# Patient Record
Sex: Female | Born: 1940 | Race: White | Marital: Married | Smoking: Former smoker
Health system: Southern US, Community
[De-identification: ages and names within clinical notes are randomized; demographics above are authoritative.]

## PROBLEM LIST (undated history)

## (undated) DIAGNOSIS — M1990 Unspecified osteoarthritis, unspecified site: Secondary | ICD-10-CM

## (undated) DIAGNOSIS — IMO0002 Reserved for concepts with insufficient information to code with codable children: Secondary | ICD-10-CM

## (undated) DIAGNOSIS — Z972 Presence of dental prosthetic device (complete) (partial): Secondary | ICD-10-CM

## (undated) DIAGNOSIS — C189 Malignant neoplasm of colon, unspecified: Secondary | ICD-10-CM

## (undated) DIAGNOSIS — K08109 Complete loss of teeth, unspecified cause, unspecified class: Secondary | ICD-10-CM

## (undated) DIAGNOSIS — Z973 Presence of spectacles and contact lenses: Secondary | ICD-10-CM

## (undated) DIAGNOSIS — C799 Secondary malignant neoplasm of unspecified site: Secondary | ICD-10-CM

## (undated) DIAGNOSIS — R531 Weakness: Secondary | ICD-10-CM

## (undated) DIAGNOSIS — I499 Cardiac arrhythmia, unspecified: Secondary | ICD-10-CM

## (undated) DIAGNOSIS — I482 Chronic atrial fibrillation, unspecified: Secondary | ICD-10-CM

## (undated) DIAGNOSIS — Z8719 Personal history of other diseases of the digestive system: Secondary | ICD-10-CM

## (undated) DIAGNOSIS — D649 Anemia, unspecified: Secondary | ICD-10-CM

## (undated) DIAGNOSIS — N181 Chronic kidney disease, stage 1: Secondary | ICD-10-CM

## (undated) DIAGNOSIS — R350 Frequency of micturition: Secondary | ICD-10-CM

## (undated) DIAGNOSIS — R630 Anorexia: Secondary | ICD-10-CM

## (undated) DIAGNOSIS — I10 Essential (primary) hypertension: Secondary | ICD-10-CM

## (undated) DIAGNOSIS — N1330 Unspecified hydronephrosis: Secondary | ICD-10-CM

## (undated) HISTORY — PX: TRANSTHORACIC ECHOCARDIOGRAM: SHX275

## (undated) HISTORY — PX: ESOPHAGOGASTRODUODENOSCOPY ENDOSCOPY: SHX5814

## (undated) HISTORY — PX: TONSILLECTOMY: SUR1361

## (undated) HISTORY — PX: HIATAL HERNIA REPAIR: SHX195

## (undated) HISTORY — PX: GASTRIC RESTRICTION SURGERY: SHX653

---

## 1971-01-13 HISTORY — PX: TUBAL LIGATION: SHX77

## 1978-09-13 HISTORY — PX: CHOLECYSTECTOMY: SHX55

## 1978-09-13 HISTORY — PX: INCISIONAL HERNIA REPAIR: SHX193

## 2013-04-03 ENCOUNTER — Other Ambulatory Visit: Payer: Self-pay | Admitting: Gastroenterology

## 2013-04-03 DIAGNOSIS — R109 Unspecified abdominal pain: Secondary | ICD-10-CM

## 2013-04-03 DIAGNOSIS — D649 Anemia, unspecified: Secondary | ICD-10-CM

## 2013-04-07 ENCOUNTER — Other Ambulatory Visit: Payer: Self-pay | Admitting: Gastroenterology

## 2013-04-07 ENCOUNTER — Ambulatory Visit
Admission: RE | Admit: 2013-04-07 | Discharge: 2013-04-07 | Disposition: A | Discharge status: Home / Self Care | Payer: Medicare Other | Source: Ambulatory Visit | Attending: Gastroenterology | Admitting: Gastroenterology

## 2013-04-07 DIAGNOSIS — R109 Unspecified abdominal pain: Secondary | ICD-10-CM

## 2013-04-07 DIAGNOSIS — D649 Anemia, unspecified: Secondary | ICD-10-CM

## 2013-04-07 MED ORDER — IOHEXOL 300 MG/ML  SOLN: 125.0000 mL | Freq: Once | INTRAMUSCULAR | Status: DC | PRN

## 2013-04-12 ENCOUNTER — Encounter (HOSPITAL_COMMUNITY): Payer: Self-pay | Admitting: *Deleted

## 2013-04-12 ENCOUNTER — Encounter (HOSPITAL_COMMUNITY): Payer: Self-pay | Admitting: Pharmacy Technician

## 2013-04-18 ENCOUNTER — Encounter (HOSPITAL_COMMUNITY): Payer: Self-pay | Admitting: *Deleted

## 2013-04-27 ENCOUNTER — Other Ambulatory Visit: Payer: Self-pay | Admitting: Gastroenterology

## 2013-04-27 NOTE — Addendum Note (Signed)
Addended byClarene Essex on: 04/27/2013 05:13 PM   Modules accepted: Orders

## 2013-05-02 ENCOUNTER — Ambulatory Visit (HOSPITAL_COMMUNITY): Payer: Medicare Other | Admitting: Anesthesiology

## 2013-05-02 ENCOUNTER — Encounter (HOSPITAL_COMMUNITY): Payer: Medicare Other | Admitting: Anesthesiology

## 2013-05-02 ENCOUNTER — Encounter (HOSPITAL_COMMUNITY)
Admission: RE | Disposition: A | Discharge status: Home / Self Care | Payer: Self-pay | Source: Ambulatory Visit | Attending: Gastroenterology

## 2013-05-02 ENCOUNTER — Encounter (HOSPITAL_COMMUNITY): Payer: Self-pay | Admitting: *Deleted

## 2013-05-02 ENCOUNTER — Ambulatory Visit (HOSPITAL_COMMUNITY)
Admission: RE | Admit: 2013-05-02 | Discharge: 2013-05-02 | Disposition: A | Discharge status: Home / Self Care | Payer: Medicare Other | Source: Ambulatory Visit | Attending: Gastroenterology | Admitting: Gastroenterology

## 2013-05-02 DIAGNOSIS — I129 Hypertensive chronic kidney disease with stage 1 through stage 4 chronic kidney disease, or unspecified chronic kidney disease: Secondary | ICD-10-CM | POA: Insufficient documentation

## 2013-05-02 DIAGNOSIS — Z79899 Other long term (current) drug therapy: Secondary | ICD-10-CM | POA: Insufficient documentation

## 2013-05-02 DIAGNOSIS — E785 Hyperlipidemia, unspecified: Secondary | ICD-10-CM | POA: Insufficient documentation

## 2013-05-02 DIAGNOSIS — N181 Chronic kidney disease, stage 1: Secondary | ICD-10-CM | POA: Insufficient documentation

## 2013-05-02 DIAGNOSIS — Z87891 Personal history of nicotine dependence: Secondary | ICD-10-CM | POA: Insufficient documentation

## 2013-05-02 DIAGNOSIS — D509 Iron deficiency anemia, unspecified: Secondary | ICD-10-CM | POA: Insufficient documentation

## 2013-05-02 DIAGNOSIS — I4891 Unspecified atrial fibrillation: Secondary | ICD-10-CM | POA: Insufficient documentation

## 2013-05-02 DIAGNOSIS — C182 Malignant neoplasm of ascending colon: Secondary | ICD-10-CM | POA: Insufficient documentation

## 2013-05-02 DIAGNOSIS — Z6841 Body Mass Index (BMI) 40.0 and over, adult: Secondary | ICD-10-CM | POA: Insufficient documentation

## 2013-05-02 DIAGNOSIS — M1990 Unspecified osteoarthritis, unspecified site: Secondary | ICD-10-CM | POA: Insufficient documentation

## 2013-05-02 DIAGNOSIS — E559 Vitamin D deficiency, unspecified: Secondary | ICD-10-CM | POA: Insufficient documentation

## 2013-05-02 HISTORY — DX: Anemia, unspecified: D64.9

## 2013-05-02 HISTORY — DX: Unspecified osteoarthritis, unspecified site: M19.90

## 2013-05-02 HISTORY — PX: COLONOSCOPY: SHX5424

## 2013-05-02 HISTORY — DX: Cardiac arrhythmia, unspecified: I49.9

## 2013-05-02 HISTORY — DX: Essential (primary) hypertension: I10

## 2013-05-02 LAB — POCT I-STAT 4, (NA,K, GLUC, HGB,HCT)
GLUCOSE: 89 mg/dL (ref 70–99)
HEMATOCRIT: 30 % — AB (ref 36.0–46.0)
Hemoglobin: 10.2 g/dL — ABNORMAL LOW (ref 12.0–15.0)
Potassium: 4 mEq/L (ref 3.7–5.3)
SODIUM: 140 meq/L (ref 137–147)

## 2013-05-02 LAB — CEA: CEA: 2.8 ng/mL (ref 0.0–5.0)

## 2013-05-02 SURGERY — COLONOSCOPY
Anesthesia: Monitor Anesthesia Care

## 2013-05-02 MED ORDER — SODIUM CHLORIDE 0.9 % IV SOLN: INTRAVENOUS | Status: DC

## 2013-05-02 MED ORDER — PROMETHAZINE HCL 25 MG/ML IJ SOLN: 6.2500 mg | INTRAMUSCULAR | Status: DC | PRN

## 2013-05-02 MED ORDER — PROPOFOL 10 MG/ML IV BOLUS
INTRAVENOUS | Status: DC | PRN
  Administered 2013-05-02 (×4): 50 mg via INTRAVENOUS

## 2013-05-02 MED ORDER — PROPOFOL 10 MG/ML IV BOLUS
INTRAVENOUS | Status: AC
  Filled 2013-05-02: qty 20

## 2013-05-02 MED ORDER — SPOT INK MARKER SYRINGE KIT
PACK | SUBMUCOSAL | Status: AC
  Filled 2013-05-02: qty 5

## 2013-05-02 MED ORDER — SPOT INK MARKER SYRINGE KIT
PACK | SUBMUCOSAL | Status: DC | PRN
  Administered 2013-05-02: 3 mL via SUBMUCOSAL

## 2013-05-02 MED ORDER — LACTATED RINGERS IV SOLN
INTRAVENOUS | Status: DC
  Administered 2013-05-02: 1000 mL via INTRAVENOUS

## 2013-05-02 NOTE — Transfer of Care (Signed)
Immediate Anesthesia Transfer of Care Note  Patient: Natalie Chapman  Procedure(s) Performed: Procedure(s) (LRB): COLONOSCOPY (N/A)  Patient Location: PACU  Anesthesia Type: MAC  Level of Consciousness: sedated, patient cooperative and responds to stimulation  Airway & Oxygen Therapy: Patient Spontanous Breathing and Patient connected to face mask oxgen  Post-op Assessment: Report given to PACU RN and Post -op Vital signs reviewed and stable  Post vital signs: Reviewed and stable  Complications: No apparent anesthesia complications

## 2013-05-02 NOTE — Progress Notes (Signed)
Natalie Chapman 12:48 PM  Subjective: Patient without any new complaints since we last saw her and she has not had her blood count rechecked  Objective: Vital signs stable afebrile exam please see pre-assessment evaluation  Assessment: Multiple medical problems including microcytic anemia and abnormal CAT scan  Plan: Okay to proceed with colonoscopy and anesthesia assistance and await recheck hemoglobin  Jeryl Columbia

## 2013-05-02 NOTE — Op Note (Signed)
[facility], [address]   COLONOSCOPY PROCEDURE REPORT  PATIENT: Natalie Chapman, Natalie Chapman  MR#: [id number] BIRTHDATE: May 29, 1940 , 84  yrs. old GENDER: Female ENDOSCOPIST: Clarene Essex, MD REFERRED UY:QIHKV Tripp, M.D. PROCEDURE DATE:  05/02/2013 PROCEDURE:   Colonoscopy with biopsy and Submucosal injection, any substance ASA CLASS:   Class III INDICATIONS:an abnormal CT and Iron Deficiency Anemia. MEDICATIONS: propofol (Diprivan) 200mg  IV  DESCRIPTION OF PROCEDURE:   After the risks benefits and alternatives of the procedure were thoroughly explained, informed consent was obtained.  The [id number] ([id number])  endoscope was introduced through the anus and advanced to the cecum, which was identified by both the appendix and ileocecal valve , limited by No adverse events experienced.  to advanced to the cecum did require abdominal pressureThe quality of the prep was adequate. .  The instrument was then slowly withdrawn as the colon was fully examined.the findings are recorded below and the patient tolerated the procedure well there was no obvious immediate complication       FINDINGS:  1. Partially obstructing ulcerative proximal ascending mass status post biopsy and distal injection with spot tattoo x3 approximately 1 cc each 2. Significant external greater than internal hemorrhoids 3 otherwise within normal limits to the cecum  COMPLICATIONS: none  IMPRESSION:  above  RECOMMENDATIONS: await pathology and CEA and surgical consultationand gI followup when necessary   _______________________________ eSigned:  Clarene Essex, MD 05/02/2013 1:50 PM   OV:FIEPP Fredderick Phenix, MD

## 2013-05-02 NOTE — Anesthesia Postprocedure Evaluation (Signed)
  Anesthesia Post-op Note  Patient: Natalie Chapman  Procedure(s) Performed: Procedure(s) (LRB): COLONOSCOPY (N/A)  Patient Location: PACU  Anesthesia Type: MAC  Level of Consciousness: awake and alert   Airway and Oxygen Therapy: Patient Spontanous Breathing  Post-op Pain: mild  Post-op Assessment: Post-op Vital signs reviewed, Patient's Cardiovascular Status Stable, Respiratory Function Stable, Patent Airway and No signs of Nausea or vomiting  Last Vitals:  Filed Vitals:   05/02/13 1343  BP: 101/61  Temp:   Resp: 8    Post-op Vital Signs: stable   Complications: No apparent anesthesia complications

## 2013-05-02 NOTE — Discharge Instructions (Signed)
Colonoscopy, Care After Refer to this sheet in the next few weeks. These instructions provide you with information on caring for yourself after your procedure. Your health care provider may also give you more specific instructions. Your treatment has been planned according to current medical practices, but problems sometimes occur. Call your health care provider if you have any problems or questions after your procedure. WHAT TO EXPECT AFTER THE PROCEDURE  After your procedure, it is typical to have the following:  A small amount of blood in your stool.  Moderate amounts of gas and mild abdominal cramping or bloating. HOME CARE INSTRUCTIONS  Do not drive, operate machinery, or sign important documents for 24 hours.  You may shower and resume your regular physical activities, but move at a slower pace for the first 24 hours.  Take frequent rest periods for the first 24 hours.  Walk around or put a warm pack on your abdomen to help reduce abdominal cramping and bloating.  Drink enough fluids to keep your urine clear or pale yellow.  You may resume your normal diet as instructed by your health care provider. Avoid heavy or fried foods that are hard to digest.  Avoid drinking alcohol for 24 hours or as instructed by your health care provider.  Only take over-the-counter or prescription medicines as directed by your health care provider.  If a tissue sample (biopsy) was taken during your procedure:  Do not take aspirin or blood thinners for 7 days, or as instructed by your health care provider.  Do not drink alcohol for 7 days, or as instructed by your health care provider.  Eat soft foods for the first 24 hours. SEEK MEDICAL CARE IF: You have persistent spotting of blood in your stool 2 3 days after the procedure. SEEK IMMEDIATE MEDICAL CARE IF:  You have more than a small spotting of blood in your stool.  You pass large blood clots in your stool.  Your abdomen is swollen  (distended).  You have nausea or vomiting.  You have a fever.  You have increasing abdominal pain that is not relieved with medicine. Document Released: 08/13/2003 Document Revised: 10/19/2012 Document Reviewed: 09/05/2012 [facility] Patient Information 2014 [place]. Call if question or problem otherwise call for biopsy report on Monday

## 2013-05-02 NOTE — Anesthesia Preprocedure Evaluation (Addendum)
Anesthesia Evaluation  Patient identified by MRN, date of birth, ID band Patient awake    Reviewed: Allergy & Precautions, H&P , NPO status , Patient's Chart, lab work & pertinent test results  Airway Mallampati: II TM Distance: >3 FB Neck ROM: Full    Dental no notable dental hx.    Pulmonary neg pulmonary ROS, former smoker,  breath sounds clear to auscultation  Pulmonary exam normal       Cardiovascular hypertension, Pt. on medications and Pt. on home beta blockers + dysrhythmias Atrial Fibrillation Rhythm:Irregular Rate:Normal     Neuro/Psych negative neurological ROS  negative psych ROS   GI/Hepatic negative GI ROS, Neg liver ROS,   Endo/Other  negative endocrine ROS  Renal/GU negative Renal ROS  negative genitourinary   Musculoskeletal negative musculoskeletal ROS (+)   Abdominal   Peds negative pediatric ROS (+)  Hematology negative hematology ROS (+)   Anesthesia Other Findings   Reproductive/Obstetrics negative OB ROS                         Anesthesia Physical Anesthesia Plan  ASA: III  Anesthesia Plan: MAC   Post-op Pain Management:    Induction: Intravenous  Airway Management Planned: Nasal Cannula  Additional Equipment:   Intra-op Plan:   Post-operative Plan:   Informed Consent: I have reviewed the patients History and Physical, chart, labs and discussed the procedure including the risks, benefits and alternatives for the proposed anesthesia with the patient or authorized representative who has indicated his/her understanding and acceptance.   Dental advisory given  Plan Discussed with: CRNA and Surgeon  Anesthesia Plan Comments:         Anesthesia Quick Evaluation

## 2013-05-03 ENCOUNTER — Encounter (HOSPITAL_COMMUNITY): Payer: Self-pay | Admitting: Gastroenterology

## 2013-05-12 ENCOUNTER — Encounter (INDEPENDENT_AMBULATORY_CARE_PROVIDER_SITE_OTHER): Payer: Self-pay | Admitting: General Surgery

## 2013-05-12 ENCOUNTER — Ambulatory Visit (INDEPENDENT_AMBULATORY_CARE_PROVIDER_SITE_OTHER): Payer: Medicare Other | Admitting: General Surgery

## 2013-05-12 VITALS — BP 126/74 | HR 80 | Temp 97.1°F | Resp 14 | Ht 63.0 in | Wt 270.0 lb

## 2013-05-12 DIAGNOSIS — N184 Chronic kidney disease, stage 4 (severe): Secondary | ICD-10-CM

## 2013-05-12 DIAGNOSIS — M1990 Unspecified osteoarthritis, unspecified site: Secondary | ICD-10-CM | POA: Insufficient documentation

## 2013-05-12 DIAGNOSIS — I4891 Unspecified atrial fibrillation: Secondary | ICD-10-CM | POA: Insufficient documentation

## 2013-05-12 DIAGNOSIS — D649 Anemia, unspecified: Secondary | ICD-10-CM | POA: Insufficient documentation

## 2013-05-12 DIAGNOSIS — C189 Malignant neoplasm of colon, unspecified: Secondary | ICD-10-CM | POA: Insufficient documentation

## 2013-05-12 NOTE — Patient Instructions (Signed)
[facility] [employer] [facility]  ONE-DAY (1) PRE-OP HOME COLON PREP INSTRUCTIONS: ** MIRALAX / GATORADE PREP **  Fill the two prescriptions at a pharmacy of your choice.  You must follow the instructions below carefully.  If you have questions or problems, please call and speak to someone in the clinic department at our office:   [phone].  MIRALAX - GATORADE -- DULCOLAX TABS:   Fill the prescriptions for MIRALAX  (255 gm bottle)    In addition, purchase four (4) DULCOLAX TABLETS (no prescription required), and one 64 oz GATORADE.  (Do NOT purchase red Gatorade; any other flavor is acceptable).  ANITIBIOTICS:   There will be 2 different antibiotics.     Take both prescriptions THE AFTERNOON BEFORE your surgery, at the times written on the bottles.  INSTRUCTIONS: 1. Five days prior to your procedure do not eat nuts, popcorn, or fruit with seeds.  Stop all fiber supplements such as Metamucil, Citrucel, etc.  2. The day before your procedure: o 6:00am:  take (4) Dulcolax tablets.  You should remain on clear liquids for the entire day.   CLEAR LIQUIDS: clear bouillon, broth, jello (NOT RED), black coffee, tea, soda, etc o 10:00am:  add the bottle of MiraLax to the 64-oz bottle of Gatorade, and dissolve.  Begin drinking the Gatorade mixture until gone (8 oz every 15-30 minutes).  Continue clear liquids until midnight (or bedtime). o Take the antibiotics at the times instructed on the bottles.  3. The day of your procedure:   Do not eat or drink ANYTHING after midnight before your surgery.     If you take Heart or Blood Pressure medicine, ask the pre-op nurses about these during your preop appointment.   Further pre-operative instructions will be given to you from the hospital.   Expect to be contacted 5-7 days before your surgery.       

## 2013-05-12 NOTE — Progress Notes (Signed)
Patient ID: Natalie Chapman, female   DOB: 04/25/1940, 73 y.o.   MRN: [id number]  Chief Complaint  Patient presents with  . eval for colon ca    HPI Natalie Chapman is a 73 y.o. female.   HPI  She is referred by Dr. Watt Chapman because of newly diagnosed ascending colon cancer.  She was noted to be anemic and also had blood in her stool. CT scan done in March of this year demonstrated a lesion in the ascending colon just proximal to the ileocecal valve. Colonoscopy was performed which demonstrated a circumferential lesion in that area. Biopsies were consistent with adenocarcinoma. CT scan did not show evidence of metastatic disease. She has been taking iron and her hemoglobin has improved.  She is here with family members.  Past Medical History  Diagnosis Date  . Hypertension   . Chronic kidney disease     Stage 1 kidney insufficiency  . Arthritis     arthritis left hip-ambulates with walker  . Anemia   . Cataracts, bilateral 04-18-13    both -immmature  . Dysrhythmia 04-18-13    irregular heart beat "Atrial Fib"-Dr. Tripp,PCP follows    Past Surgical History  Procedure Laterality Date  . Tubal ligation    . Stomach stapling      [facility]- [facility]  . Hernia repair      abdomen  . Tonsillectomy    . Colonoscopy N/A 05/02/2013    Procedure: COLONOSCOPY;  Surgeon: Natalie Columbia, MD;  Location: [facility];  Service: Endoscopy;  Laterality: N/A;   Open cholecystectomy  Family History  Problem Relation Age of Onset  . Heart disease Mother   . Heart disease Father     Social History History  Substance Use Topics  . Smoking status: Former Smoker    Types: Cigarettes    Quit date: 04/18/1985  . Smokeless tobacco: Not on file  . Alcohol Use: No    No Known Allergies  Current Outpatient Prescriptions  Medication Sig Dispense Refill  . acetaminophen (TYLENOL) 650 MG CR tablet Take 650 mg by mouth every 8 (eight) hours as needed for pain.      Marland Kitchen aspirin EC 81 MG tablet Take 81 mg by mouth  daily.      Marland Kitchen atorvastatin (LIPITOR) 10 MG tablet Take 5 mg by mouth at bedtime.      . enalapril (VASOTEC) 2.5 MG tablet Take 2.5 mg by mouth every morning.      . ferrous fumarate (HEMOCYTE - 106 MG FE) 325 (106 FE) MG TABS tablet Take 1 tablet by mouth 2 (two) times daily.      . metoprolol succinate (TOPROL-XL) 25 MG 24 hr tablet Take 25 mg by mouth every morning.      . pantoprazole (PROTONIX) 40 MG tablet Take 40 mg by mouth daily.       No current facility-administered medications for this visit.    Review of Systems Review of Systems  Constitutional: Negative.   Respiratory: Negative.   Cardiovascular: Positive for leg swelling.  Gastrointestinal: Positive for blood in stool.  Endocrine: Negative.   Genitourinary: Negative.   Musculoskeletal: Positive for arthralgias and gait problem.  Neurological: Negative.   Hematological: Negative.   Psychiatric/Behavioral: Negative.     Blood pressure 126/74, pulse 80, temperature 97.1 F (36.2 C), resp. rate 14, height 5\' 3"  (1.6 m), weight 270 lb (122.471 kg).  Physical Exam Physical Exam  Constitutional:  Morbidly obese female. She has very limited mobility.  HENT:  Head: Normocephalic and atraumatic.  Eyes: No scleral icterus.  Neck: Neck supple.  Cardiovascular:  Irregular rate, irregular rhythm.  Pulmonary/Chest: Effort normal and breath sounds normal.  Abdominal: Soft. She exhibits no mass. There is no tenderness.  Obese. Upper midline scar. Lower midline scar. Right subcostal scar. Large pannus.  Lymphadenopathy:    She has no cervical adenopathy.    Data Reviewed CT scan. Colonoscopy report. Pathology report. Note from Dr. Watt Chapman.  Assessment    Newly diagnosed cancer of the ascending colon. No radiographic evidence of malignant disease. She has a history of atrial flutter ablation and has never been seen by cardiologist for this. She also is morbidly obese and has very limited mobility.  Because of these 2  things, I feel her perioperative risks are higher.    Plan    We discussed laparoscopic possible open partial colectomy. Prior to this however she would need to be seen by cardiologist and had a preoperative cardiac evaluation.  I have explained the procedure and risks of colon resection.  Risks include but are not limited to bleeding, infection, wound problems, anesthesia, anastomotic leak, need for colostomy, injury to intraabominal organs (such as intestine, spleen, kidney, bladder, ureter, etc.), ileus, irregular bowel habits.  She may also have to go to a rehabilitation facility or skilled nursing facility postoperatively.   We will await for the cardiology evaluation before discussing scheduling of the surgery.      Natalie Chapman 05/12/2013, 4:19 PM

## 2013-05-29 ENCOUNTER — Encounter (INDEPENDENT_AMBULATORY_CARE_PROVIDER_SITE_OTHER): Payer: Self-pay

## 2013-05-31 ENCOUNTER — Encounter (INDEPENDENT_AMBULATORY_CARE_PROVIDER_SITE_OTHER): Payer: Self-pay

## 2013-06-19 ENCOUNTER — Encounter: Payer: Self-pay | Admitting: Cardiovascular Disease

## 2013-06-19 ENCOUNTER — Ambulatory Visit (INDEPENDENT_AMBULATORY_CARE_PROVIDER_SITE_OTHER): Payer: Medicare Other | Admitting: Cardiovascular Disease

## 2013-06-19 VITALS — BP 135/77 | HR 90 | Ht 60.0 in | Wt 266.8 lb

## 2013-06-19 DIAGNOSIS — I4891 Unspecified atrial fibrillation: Secondary | ICD-10-CM

## 2013-06-19 DIAGNOSIS — Z01810 Encounter for preprocedural cardiovascular examination: Secondary | ICD-10-CM

## 2013-06-19 NOTE — Assessment & Plan Note (Addendum)
Continue beta blocker for rate control  She may need iv cardizem post op  Surgery can consult our service in hospital and she will need telemetry bed post op.  She will be high risk for DVT  Suspect she will restart xarelto about 2 weeks after surgery  Echo to r/o valve disease Assess chamber sizes and make sure there is no pulmonary hypertesnion or decreased EF

## 2013-06-19 NOTE — Progress Notes (Signed)
Patient ID: Natalie Chapman, female   DOB: [birth year], 73 y.o.   MRN: [id number]   73 yo obese female referred by Dr Zella Richer for preop clearance She has no history of CHF, CAD but has afib.  Has not been seen by a cardiologist  Primary comes to house Started on xarelto and after about 5 weeks had GI bleed.  Seen by Dr Watt Climes and has colon CA  Needs right hemicolectomy with general anethesia.  No chest pain.  Chronic dyspnea from obesity.  Can do ADL's and barely use walker around house.  Chronic LE edema  No recent surgery Denies bleeding issues before starting xarelto.  Compliant with meds for HTN On beta blocker for rate control      ROS: Denies fever, malais, weight loss, blurry vision, decreased visual acuity, cough, sputum, SOB, hemoptysis, pleuritic pain, palpitaitons, heartburn, abdominal pain, melena, lower extremity edema, claudication, or rash.  All other systems reviewed and negative   General: Affect appropriate Pale obese white female   HEENT: normal Neck supple with no adenopathy JVP normal no bruits no thyromegaly Lungs clear with no wheezing and good diaphragmatic motion Heart:  S1/S2 no murmur,rub, gallop or click PMI normal Abdomen: benighn, BS positve, no tenderness, no AAA no bruit.  No HSM or HJR Distal pulses intact with no bruits Plus 2 bilteral  edema Neuro non-focal Skin warm and dry No muscular weakness  Medications Current Outpatient Prescriptions  Medication Sig Dispense Refill  . acetaminophen (TYLENOL) 650 MG CR tablet Take 650 mg by mouth every 8 (eight) hours as needed for pain.      Marland Kitchen aspirin EC 81 MG tablet Take 81 mg by mouth daily.      Marland Kitchen atorvastatin (LIPITOR) 10 MG tablet Take 5 mg by mouth at bedtime.      . Cholecalciferol (VITAMIN D3) 400 UNITS tablet Take 800 Units by mouth daily.      . enalapril (VASOTEC) 2.5 MG tablet Take 2.5 mg by mouth every morning.      . ferrous fumarate (HEMOCYTE - 106 MG FE) 325 (106 FE) MG TABS tablet Take 1  tablet by mouth 2 (two) times daily.      . metoprolol succinate (TOPROL-XL) 25 MG 24 hr tablet Take 25 mg by mouth every morning.      . pantoprazole (PROTONIX) 40 MG tablet Take 40 mg by mouth daily.       No current facility-administered medications for this visit.    Allergies Review of patient's allergies indicates no known allergies.  Family History: Family History  Problem Relation Age of Onset  . Heart disease Mother   . Heart disease Father     Social History: History   Social History  . Marital Status: Married    Spouse Name: N/A    Number of Children: N/A  . Years of Education: N/A   Occupational History  . Not on file.   Social History Main Topics  . Smoking status: Former Smoker    Types: Cigarettes    Quit date: 04/18/1985  . Smokeless tobacco: Not on file  . Alcohol Use: No  . Drug Use: No  . Sexual Activity: No   Other Topics Concern  . Not on file   Social History Narrative  . No narrative on file    Electrocardiogram:  Afib rate 90 nonspecific St T wave changes   Assessment and Plan

## 2013-06-19 NOTE — Assessment & Plan Note (Signed)
See notes below  Needs echo before clearing for surgery to assess RV and LV function.  No need for perfusion stress test as she does not have history of CAD, and no chest pain with surgery needed for cancer

## 2013-06-19 NOTE — Patient Instructions (Signed)

## 2013-06-20 ENCOUNTER — Ambulatory Visit (HOSPITAL_COMMUNITY): Payer: Medicare Other | Attending: Cardiovascular Disease | Admitting: Radiology

## 2013-06-20 DIAGNOSIS — I4891 Unspecified atrial fibrillation: Secondary | ICD-10-CM

## 2013-06-20 NOTE — Progress Notes (Signed)
Echocardiogram performed.  

## 2013-06-22 ENCOUNTER — Telehealth: Payer: Self-pay | Admitting: *Deleted

## 2013-06-22 NOTE — Telephone Encounter (Signed)
MAY  PT  PROCEED WITH  COLON  SURGERY ? CY

## 2013-06-23 ENCOUNTER — Other Ambulatory Visit (INDEPENDENT_AMBULATORY_CARE_PROVIDER_SITE_OTHER): Payer: Self-pay | Admitting: General Surgery

## 2013-06-23 ENCOUNTER — Telehealth (INDEPENDENT_AMBULATORY_CARE_PROVIDER_SITE_OTHER): Payer: Self-pay

## 2013-06-23 NOTE — Telephone Encounter (Signed)
Ok to proceed with colon surgery

## 2013-06-23 NOTE — Telephone Encounter (Signed)
Dr.Nishan's note fowarded to Francee Nodal RN

## 2013-06-23 NOTE — Telephone Encounter (Signed)
Dr Fredderick Phenix was at pt's home and wanted to see if Dr Zella Richer had scheduled sx. Informed Dr Fredderick Phenix that we were waiting on cardiac clearance for pt. Informed Dr Fredderick Phenix that Dr. Johnsie Cancel has cleared her today and that I would inform Dr Zella Richer and his assistant of this clearance. Dr Fredderick Phenix verbalized understanding and agrees with POC.

## 2013-06-23 NOTE — Telephone Encounter (Signed)
New Message  Dr. Shona Simpson called for Cardiac clearance for a Colon resection, Please call back to assist//SR

## 2013-06-26 ENCOUNTER — Encounter (INDEPENDENT_AMBULATORY_CARE_PROVIDER_SITE_OTHER): Payer: Medicare Other | Admitting: Surgery

## 2013-06-27 ENCOUNTER — Telehealth (INDEPENDENT_AMBULATORY_CARE_PROVIDER_SITE_OTHER): Payer: Self-pay

## 2013-06-27 NOTE — Telephone Encounter (Signed)
Confirmed with the patient that cardiac clearance rec'd from Dr. Johnsie Cancel.  Orders for surgery are in Epic.  Dr. Zella Richer out of the office until 07/03/13, but orders will be written when he returns.

## 2013-06-27 NOTE — Telephone Encounter (Signed)
Orders written for surgery and signed off on by Dr. Marlou Starks for Dr. Zella Richer.  Orders sent to surgery scheduling and pt will be notified some time this week about a surgery date.

## 2013-07-07 ENCOUNTER — Encounter (HOSPITAL_COMMUNITY): Payer: Self-pay | Admitting: Pharmacy Technician

## 2013-07-12 ENCOUNTER — Encounter (HOSPITAL_COMMUNITY): Payer: Self-pay

## 2013-07-12 ENCOUNTER — Other Ambulatory Visit (HOSPITAL_COMMUNITY): Payer: Self-pay | Admitting: *Deleted

## 2013-07-12 ENCOUNTER — Encounter (HOSPITAL_COMMUNITY)
Admission: RE | Admit: 2013-07-12 | Discharge: 2013-07-12 | Disposition: A | Discharge status: Home / Self Care | Payer: Medicare Other | Source: Ambulatory Visit | Attending: General Surgery | Admitting: General Surgery

## 2013-07-12 DIAGNOSIS — Z01818 Encounter for other preprocedural examination: Secondary | ICD-10-CM | POA: Insufficient documentation

## 2013-07-12 DIAGNOSIS — Z01812 Encounter for preprocedural laboratory examination: Secondary | ICD-10-CM | POA: Insufficient documentation

## 2013-07-12 HISTORY — DX: Personal history of other diseases of the digestive system: Z87.19

## 2013-07-12 LAB — PROTIME-INR
INR: 1.3 (ref 0.00–1.49)
Prothrombin Time: 16.2 seconds — ABNORMAL HIGH (ref 11.6–15.2)

## 2013-07-12 LAB — COMPREHENSIVE METABOLIC PANEL
ALK PHOS: 56 U/L (ref 39–117)
ALT: <5 U/L (ref 0–35)
AST: 9 U/L (ref 0–37)
Albumin: 3.7 g/dL (ref 3.5–5.2)
Anion gap: 14 (ref 5–15)
BUN: 26 mg/dL — ABNORMAL HIGH (ref 6–23)
CO2: 21 mEq/L (ref 19–32)
CREATININE: 1.5 mg/dL — AB (ref 0.50–1.10)
Calcium: 9.5 mg/dL (ref 8.4–10.5)
Chloride: 104 mEq/L (ref 96–112)
GFR calc non Af Amer: 33 mL/min — ABNORMAL LOW (ref >90)
GFR, EST AFRICAN AMERICAN: 39 mL/min — AB (ref >90)
GLUCOSE: 96 mg/dL (ref 70–99)
Potassium: 5.5 mEq/L — ABNORMAL HIGH (ref 3.7–5.3)
Sodium: 139 mEq/L (ref 137–147)
TOTAL PROTEIN: 8.1 g/dL (ref 6.0–8.3)
Total Bilirubin: 0.7 mg/dL (ref 0.3–1.2)

## 2013-07-12 LAB — CBC WITH DIFFERENTIAL/PLATELET
BASOS PCT: 1 % (ref 0–1)
Basophils Absolute: 0 10*3/uL (ref 0.0–0.1)
EOS ABS: 0.3 10*3/uL (ref 0.0–0.7)
EOS PCT: 5 % (ref 0–5)
HCT: 27 % — ABNORMAL LOW (ref 36.0–46.0)
Hemoglobin: 8.5 g/dL — ABNORMAL LOW (ref 12.0–15.0)
LYMPHS ABS: 1.1 10*3/uL (ref 0.7–4.0)
Lymphocytes Relative: 18 % (ref 12–46)
MCH: 28.6 pg (ref 26.0–34.0)
MCHC: 31.5 g/dL (ref 30.0–36.0)
MCV: 90.9 fL (ref 78.0–100.0)
MONOS PCT: 9 % (ref 3–12)
Monocytes Absolute: 0.5 10*3/uL (ref 0.1–1.0)
Neutro Abs: 4 10*3/uL (ref 1.7–7.7)
Neutrophils Relative %: 67 % (ref 43–77)
Platelets: 221 10*3/uL (ref 150–400)
RBC: 2.97 MIL/uL — AB (ref 3.87–5.11)
RDW: 16.1 % — ABNORMAL HIGH (ref 11.5–15.5)
WBC: 5.9 10*3/uL (ref 4.0–10.5)

## 2013-07-12 LAB — ABO/RH: ABO/RH(D): A POS

## 2013-07-12 NOTE — Pre-Procedure Instructions (Signed)
Natalie Chapman  07/12/2013   Your procedure is scheduled on:  Thursday, July 20, 2013 at 7:30 AM.   Report to [facility] Entrance "A" Admitting Office at 5:30 AM.   Call this number if you have problems the morning of surgery: [phone]   Remember:   Do not eat food or drink liquids after midnight Wednesday, 07/19/13.   Take these medicines the morning of surgery with A SIP OF WATER: metoprolol succinate (TOPROL-XL), acetaminophen (TYLENOL) - if needed.  Stop Aspirin as of today.     Do not wear jewelry, make-up or nail polish.  Do not wear lotions, powders, or perfumes. You may wear deodorant.  Do not shave 48 hours prior to surgery.   Do not bring valuables to the hospital.  [facility] is not responsible                  for any belongings or valuables.               Contacts, dentures or bridgework may not be worn into surgery.  Leave suitcase in the car. After surgery it may be brought to your room.  For patients admitted to the hospital, discharge time is determined by your                treatment team.                Special Instructions: [facility] - Preparing for Surgery  Before surgery, you can play an important role.  Because skin is not sterile, your skin needs to be as free of germs as possible.  You can reduce the number of germs on you skin by washing with CHG (chlorahexidine gluconate) soap before surgery.  CHG is an antiseptic cleaner which kills germs and bonds with the skin to continue killing germs even after washing.  Please DO NOT use if you have an allergy to CHG or antibacterial soaps.  If your skin becomes reddened/irritated stop using the CHG and inform your nurse when you arrive at Short Stay.  Do not shave (including legs and underarms) for at least 48 hours prior to the first CHG shower.  You may shave your face.  Please follow these instructions carefully:   1.  Shower with CHG Soap the night before surgery and the                                 morning of Surgery.  2.  If you choose to wash your hair, wash your hair first as usual with your       normal shampoo.  3.  After you shampoo, rinse your hair and body thoroughly to remove the                      Shampoo.  4.  Use CHG as you would any other liquid soap.  You can apply chg directly       to the skin and wash gently with scrungie or a clean washcloth.  5.  Apply the CHG Soap to your body ONLY FROM THE NECK DOWN.        Do not use on open wounds or open sores.  Avoid contact with your eyes, ears, mouth and genitals (private parts).  Wash genitals (private parts) with your normal soap.  6.  Wash thoroughly, paying special attention to the area where your surgery  will be performed.  7.  Thoroughly rinse your body with warm water from the neck down.  8.  DO NOT shower/wash with your normal soap after using and rinsing off       the CHG Soap.  9.  Pat yourself dry with a clean towel.            10.  Wear clean pajamas.            11.  Place clean sheets on your bed the night of your first shower and do not        sleep with pets.  Day of Surgery  Do not apply any lotions the morning of surgery.  Please wear clean clothes to the hospital/surgery center.     Please read over the following fact sheets that you were given: Pain Booklet, Coughing and Deep Breathing, Blood Transfusion Information and Surgical Site Infection Prevention

## 2013-07-12 NOTE — Progress Notes (Signed)
07/12/13 1439  OBSTRUCTIVE SLEEP APNEA  Have you ever been diagnosed with sleep apnea through a sleep study? No  Do you snore loudly (loud enough to be heard through closed doors)?  1  Do you often feel tired, fatigued, or sleepy during the daytime? 1  Has anyone observed you stop breathing during your sleep? 1  Do you have, or are you being treated for high blood pressure? 1  BMI more than 35 kg/m2? 1  Age over 74 years old? 1  Neck circumference greater than 40 cm/16 inches? 0 (14)  Gender: 0  Obstructive Sleep Apnea Score 6  Score 4 or greater  Results sent to PCP

## 2013-07-13 NOTE — Progress Notes (Signed)
Anesthesia Chart Review:  Patient is a 73 year old female scheduled for laparoscopic assisted partial colectomy on 07/20/13 by Dr. Zella Richer.  History includes colon cancer, former smoker, afib, CKD stage 1, anemia, hiatal hernia, arthritis, cataracts, SOB, venous insufficiency with venous stasis ulcer history, cholecystectomy, abdominal hernia repair, gastric stapling, tonsillectomy.  BMI is consistent with morbid obesity.  OSA screening score is 6. PCP is Dr. Reymundo Poll. Cardiologist is Dr. Johnsie Cancel who cleared her for surgery. His note states she may need Cardizem post-operatively and will need post-operative telemetry. He felt she would be high risk for DVT, and thought her Xarelto would be started about 2 weeks post-operatively.   EKG on 06/19/13 showed: Afib, low voltage QRS, T wave abnormality, consider inferior and anterior ischemia.   Echo on 06/20/13 showed:  - Left ventricle: The cavity size was normal. Wall thickness was normal. Systolic function was normal. The estimated ejection fraction was in the range of 55% to 60%. - Aortic valve: Possible lamble&'s excresense. - Mitral valve: There was mild regurgitation. - Left atrium: The atrium was severely dilated. - Atrial septum: No defect or patent foramen ovale was identified. - Pulmonic valve: Mild regurgitation. - Tricuspid valve: There was moderate regurgitation.  CXR on 07/12/13 showed: Prominent coarse interstitial markings most likely chronic in nature. A superimposed active inflammatory process cannot be excluded. No edema is seen.  Preoperative labs noted. K 5.5 (no mention of hemolysis), BUN/Cr 26/1.50. Previous BUN/Cr on 05/04/13 were 13/1.12.  H/H 8.5/27.0, up from her last few H/H (last 8.0/24.2 on 06/23/13). She is on an iron supplement. PT/INR 16.2/1.30. T&S done. Labs reviewed with anesthesiologist Dr. Tobias Alexander who did not feel labs would need to be repeated preoperatively. Will defer decision to transfuse to the surgeon and/or  assigned anesthesiologist.    Myra Gianotti, PA-C [facility] Short Stay [facility]/Anesthesiology Phone [phone] 07/13/2013 5:29 PM

## 2013-07-19 MED ORDER — DEXTROSE 5 % IV SOLN
1.0000 g | Freq: Two times a day (BID) | INTRAVENOUS | Status: DC
  Administered 2013-07-20: 1 g via INTRAVENOUS
  Filled 2013-07-19 (×3): qty 1

## 2013-07-19 MED ORDER — ALVIMOPAN 12 MG PO CAPS
12.0000 mg | ORAL_CAPSULE | Freq: Once | ORAL | Status: AC
  Administered 2013-07-20: 12 mg via ORAL
  Filled 2013-07-19 (×2): qty 1

## 2013-07-20 ENCOUNTER — Encounter (HOSPITAL_COMMUNITY)
Admission: RE | Disposition: A | Discharge status: Home Health | Payer: Self-pay | Source: Ambulatory Visit | Attending: General Surgery

## 2013-07-20 ENCOUNTER — Encounter (HOSPITAL_COMMUNITY): Payer: Self-pay | Admitting: Anesthesiology

## 2013-07-20 ENCOUNTER — Encounter (HOSPITAL_COMMUNITY): Payer: Medicare Other | Admitting: Vascular Surgery

## 2013-07-20 ENCOUNTER — Inpatient Hospital Stay (HOSPITAL_COMMUNITY): Payer: Medicare Other | Admitting: Anesthesiology

## 2013-07-20 ENCOUNTER — Inpatient Hospital Stay (HOSPITAL_COMMUNITY)
Admission: RE | Admit: 2013-07-20 | Discharge: 2013-07-24 | DRG: 330 | Disposition: A | Discharge status: Home Health | Payer: Medicare Other | Source: Ambulatory Visit | Attending: General Surgery | Admitting: General Surgery

## 2013-07-20 DIAGNOSIS — I129 Hypertensive chronic kidney disease with stage 1 through stage 4 chronic kidney disease, or unspecified chronic kidney disease: Secondary | ICD-10-CM | POA: Diagnosis present

## 2013-07-20 DIAGNOSIS — Z79899 Other long term (current) drug therapy: Secondary | ICD-10-CM

## 2013-07-20 DIAGNOSIS — I4891 Unspecified atrial fibrillation: Secondary | ICD-10-CM | POA: Diagnosis present

## 2013-07-20 DIAGNOSIS — J9819 Other pulmonary collapse: Secondary | ICD-10-CM | POA: Diagnosis not present

## 2013-07-20 DIAGNOSIS — D649 Anemia, unspecified: Secondary | ICD-10-CM | POA: Diagnosis present

## 2013-07-20 DIAGNOSIS — Z7982 Long term (current) use of aspirin: Secondary | ICD-10-CM

## 2013-07-20 DIAGNOSIS — Z87891 Personal history of nicotine dependence: Secondary | ICD-10-CM

## 2013-07-20 DIAGNOSIS — M129 Arthropathy, unspecified: Secondary | ICD-10-CM | POA: Diagnosis present

## 2013-07-20 DIAGNOSIS — C189 Malignant neoplasm of colon, unspecified: Secondary | ICD-10-CM | POA: Diagnosis present

## 2013-07-20 DIAGNOSIS — N181 Chronic kidney disease, stage 1: Secondary | ICD-10-CM | POA: Diagnosis present

## 2013-07-20 DIAGNOSIS — C182 Malignant neoplasm of ascending colon: Principal | ICD-10-CM | POA: Diagnosis present

## 2013-07-20 DIAGNOSIS — Z6841 Body Mass Index (BMI) 40.0 and over, adult: Secondary | ICD-10-CM

## 2013-07-20 DIAGNOSIS — N184 Chronic kidney disease, stage 4 (severe): Secondary | ICD-10-CM | POA: Diagnosis present

## 2013-07-20 HISTORY — DX: Chronic kidney disease, stage 1: N18.1

## 2013-07-20 HISTORY — PX: LAPAROSCOPIC PARTIAL COLECTOMY: SHX5907

## 2013-07-20 HISTORY — DX: Malignant neoplasm of colon, unspecified: C18.9

## 2013-07-20 LAB — CBC
HEMATOCRIT: 26.6 % — AB (ref 36.0–46.0)
Hemoglobin: 8.3 g/dL — ABNORMAL LOW (ref 12.0–15.0)
MCH: 28.7 pg (ref 26.0–34.0)
MCHC: 31.2 g/dL (ref 30.0–36.0)
MCV: 92 fL (ref 78.0–100.0)
PLATELETS: 209 10*3/uL (ref 150–400)
RBC: 2.89 MIL/uL — ABNORMAL LOW (ref 3.87–5.11)
RDW: 15 % (ref 11.5–15.5)
WBC: 6.9 10*3/uL (ref 4.0–10.5)

## 2013-07-20 LAB — CREATININE, SERUM
CREATININE: 1.31 mg/dL — AB (ref 0.50–1.10)
GFR calc Af Amer: 46 mL/min — ABNORMAL LOW (ref >90)
GFR, EST NON AFRICAN AMERICAN: 39 mL/min — AB (ref >90)

## 2013-07-20 LAB — PREPARE RBC (CROSSMATCH)

## 2013-07-20 SURGERY — LAPAROSCOPIC PARTIAL COLECTOMY
Anesthesia: General | Site: Abdomen

## 2013-07-20 MED ORDER — ONDANSETRON HCL 4 MG/2ML IJ SOLN
INTRAMUSCULAR | Status: AC
  Filled 2013-07-20: qty 2

## 2013-07-20 MED ORDER — DEXTROSE 5 % IV SOLN
2.0000 g | Freq: Two times a day (BID) | INTRAVENOUS | Status: AC
  Administered 2013-07-20: 2 g via INTRAVENOUS
  Filled 2013-07-20: qty 2

## 2013-07-20 MED ORDER — DEXTROSE-NACL 5-0.9 % IV SOLN
INTRAVENOUS | Status: DC
  Administered 2013-07-20: 1000 mL via INTRAVENOUS
  Administered 2013-07-21 (×2): via INTRAVENOUS

## 2013-07-20 MED ORDER — ONDANSETRON HCL 4 MG/2ML IJ SOLN
4.0000 mg | Freq: Four times a day (QID) | INTRAMUSCULAR | Status: DC | PRN
  Filled 2013-07-20: qty 2

## 2013-07-20 MED ORDER — DEXAMETHASONE SODIUM PHOSPHATE 4 MG/ML IJ SOLN
INTRAMUSCULAR | Status: DC | PRN
  Administered 2013-07-20: 8 mg via INTRAVENOUS

## 2013-07-20 MED ORDER — ARTIFICIAL TEARS OP OINT
TOPICAL_OINTMENT | OPHTHALMIC | Status: DC | PRN
  Administered 2013-07-20: 1 via OPHTHALMIC

## 2013-07-20 MED ORDER — ENOXAPARIN SODIUM 40 MG/0.4ML ~~LOC~~ SOLN
40.0000 mg | SUBCUTANEOUS | Status: DC
  Administered 2013-07-21 – 2013-07-24 (×4): 40 mg via SUBCUTANEOUS
  Filled 2013-07-20 (×5): qty 0.4

## 2013-07-20 MED ORDER — EPHEDRINE SULFATE 50 MG/ML IJ SOLN
INTRAMUSCULAR | Status: AC
  Filled 2013-07-20: qty 1

## 2013-07-20 MED ORDER — PROPOFOL 10 MG/ML IV BOLUS
INTRAVENOUS | Status: AC
  Filled 2013-07-20: qty 20

## 2013-07-20 MED ORDER — ROCURONIUM BROMIDE 100 MG/10ML IV SOLN
INTRAVENOUS | Status: DC | PRN
  Administered 2013-07-20: 40 mg via INTRAVENOUS
  Administered 2013-07-20 (×3): 10 mg via INTRAVENOUS

## 2013-07-20 MED ORDER — LIDOCAINE HCL (CARDIAC) 20 MG/ML IV SOLN
INTRAVENOUS | Status: DC | PRN
  Administered 2013-07-20: 100 mg via INTRAVENOUS

## 2013-07-20 MED ORDER — DIPHENHYDRAMINE HCL 50 MG/ML IJ SOLN
12.5000 mg | Freq: Four times a day (QID) | INTRAMUSCULAR | Status: DC | PRN
Start: 2013-07-20 — End: 2013-07-22
  Filled 2013-07-20: qty 0.25

## 2013-07-20 MED ORDER — ONDANSETRON HCL 4 MG PO TABS: 4.0000 mg | ORAL_TABLET | Freq: Four times a day (QID) | ORAL | Status: DC | PRN

## 2013-07-20 MED ORDER — NEOSTIGMINE METHYLSULFATE 10 MG/10ML IV SOLN
INTRAVENOUS | Status: DC | PRN
  Administered 2013-07-20 (×2): 1 mg via INTRAVENOUS
  Administered 2013-07-20: 3 mg via INTRAVENOUS

## 2013-07-20 MED ORDER — ALVIMOPAN 12 MG PO CAPS
12.0000 mg | ORAL_CAPSULE | Freq: Two times a day (BID) | ORAL | Status: DC
  Administered 2013-07-21 – 2013-07-23 (×5): 12 mg via ORAL
  Filled 2013-07-20 (×6): qty 1

## 2013-07-20 MED ORDER — ONDANSETRON HCL 4 MG/2ML IJ SOLN: 4.0000 mg | Freq: Once | INTRAMUSCULAR | Status: DC | PRN

## 2013-07-20 MED ORDER — STERILE WATER FOR INJECTION IJ SOLN
INTRAMUSCULAR | Status: AC
  Filled 2013-07-20: qty 10

## 2013-07-20 MED ORDER — LACTATED RINGERS IV SOLN: INTRAVENOUS | Status: DC | PRN

## 2013-07-20 MED ORDER — GLYCOPYRROLATE 0.2 MG/ML IJ SOLN
INTRAMUSCULAR | Status: DC | PRN
  Administered 2013-07-20: 0.4 mg via INTRAVENOUS

## 2013-07-20 MED ORDER — FENTANYL CITRATE 0.05 MG/ML IJ SOLN
INTRAMUSCULAR | Status: AC
  Filled 2013-07-20: qty 5

## 2013-07-20 MED ORDER — DIPHENHYDRAMINE HCL 12.5 MG/5ML PO ELIX
12.5000 mg | ORAL_SOLUTION | Freq: Four times a day (QID) | ORAL | Status: DC | PRN
  Filled 2013-07-20: qty 5

## 2013-07-20 MED ORDER — BUPIVACAINE HCL 0.25 % IJ SOLN
INTRAMUSCULAR | Status: DC | PRN
  Administered 2013-07-20: 10 mL

## 2013-07-20 MED ORDER — BUPIVACAINE-EPINEPHRINE (PF) 0.25% -1:200000 IJ SOLN
INTRAMUSCULAR | Status: AC
  Filled 2013-07-20: qty 30

## 2013-07-20 MED ORDER — SUCCINYLCHOLINE CHLORIDE 20 MG/ML IJ SOLN
INTRAMUSCULAR | Status: AC
  Filled 2013-07-20: qty 1

## 2013-07-20 MED ORDER — ONDANSETRON HCL 4 MG/2ML IJ SOLN
INTRAMUSCULAR | Status: DC | PRN
  Administered 2013-07-20: 4 mg via INTRAVENOUS

## 2013-07-20 MED ORDER — HYDROMORPHONE HCL PF 1 MG/ML IJ SOLN
INTRAMUSCULAR | Status: AC
  Filled 2013-07-20: qty 1

## 2013-07-20 MED ORDER — FENTANYL CITRATE 0.05 MG/ML IJ SOLN
INTRAMUSCULAR | Status: DC | PRN
  Administered 2013-07-20: 100 ug via INTRAVENOUS
  Administered 2013-07-20: 50 ug via INTRAVENOUS

## 2013-07-20 MED ORDER — NEOSTIGMINE METHYLSULFATE 10 MG/10ML IV SOLN
INTRAVENOUS | Status: AC
  Filled 2013-07-20: qty 1

## 2013-07-20 MED ORDER — ARTIFICIAL TEARS OP OINT
TOPICAL_OINTMENT | OPHTHALMIC | Status: AC
  Filled 2013-07-20: qty 3.5

## 2013-07-20 MED ORDER — PHENYLEPHRINE HCL 10 MG/ML IJ SOLN
INTRAMUSCULAR | Status: DC | PRN
  Administered 2013-07-20 (×4): 80 ug via INTRAVENOUS

## 2013-07-20 MED ORDER — DEXAMETHASONE SODIUM PHOSPHATE 4 MG/ML IJ SOLN
INTRAMUSCULAR | Status: AC
  Filled 2013-07-20: qty 2

## 2013-07-20 MED ORDER — MORPHINE SULFATE (PF) 1 MG/ML IV SOLN
INTRAVENOUS | Status: AC
  Filled 2013-07-20: qty 25

## 2013-07-20 MED ORDER — LIDOCAINE HCL (CARDIAC) 20 MG/ML IV SOLN
INTRAVENOUS | Status: AC
  Filled 2013-07-20: qty 5

## 2013-07-20 MED ORDER — MIDAZOLAM HCL 2 MG/2ML IJ SOLN
INTRAMUSCULAR | Status: AC
  Filled 2013-07-20: qty 2

## 2013-07-20 MED ORDER — SODIUM CHLORIDE 0.9 % IV SOLN
10.0000 mg | INTRAVENOUS | Status: DC | PRN
  Administered 2013-07-20: 10 ug/min via INTRAVENOUS

## 2013-07-20 MED ORDER — ONDANSETRON HCL 4 MG/2ML IJ SOLN: 4.0000 mg | INTRAMUSCULAR | Status: DC | PRN

## 2013-07-20 MED ORDER — MORPHINE SULFATE (PF) 1 MG/ML IV SOLN
INTRAVENOUS | Status: DC
  Administered 2013-07-20: 11:00:00 via INTRAVENOUS
  Administered 2013-07-20: 2 mg via INTRAVENOUS
  Administered 2013-07-20 – 2013-07-22 (×5): 1 mg via INTRAVENOUS

## 2013-07-20 MED ORDER — SODIUM CHLORIDE 0.9 % IV SOLN
INTRAVENOUS | Status: DC | PRN
  Administered 2013-07-20 (×2): via INTRAVENOUS

## 2013-07-20 MED ORDER — PHENYLEPHRINE 40 MCG/ML (10ML) SYRINGE FOR IV PUSH (FOR BLOOD PRESSURE SUPPORT)
PREFILLED_SYRINGE | INTRAVENOUS | Status: AC
  Filled 2013-07-20: qty 10

## 2013-07-20 MED ORDER — HYDROMORPHONE HCL PF 1 MG/ML IJ SOLN
0.2500 mg | INTRAMUSCULAR | Status: DC | PRN
  Administered 2013-07-20: 0.25 mg via INTRAVENOUS

## 2013-07-20 MED ORDER — PROPOFOL 10 MG/ML IV BOLUS
INTRAVENOUS | Status: DC | PRN
  Administered 2013-07-20: 140 mg via INTRAVENOUS
  Administered 2013-07-20: 30 mg via INTRAVENOUS

## 2013-07-20 MED ORDER — ROCURONIUM BROMIDE 50 MG/5ML IV SOLN
INTRAVENOUS | Status: AC
  Filled 2013-07-20: qty 2

## 2013-07-20 MED ORDER — SODIUM CHLORIDE 0.9 % IJ SOLN: 9.0000 mL | INTRAMUSCULAR | Status: DC | PRN

## 2013-07-20 MED ORDER — PANTOPRAZOLE SODIUM 40 MG IV SOLR
40.0000 mg | INTRAVENOUS | Status: DC
  Administered 2013-07-20 – 2013-07-21 (×2): 40 mg via INTRAVENOUS
  Filled 2013-07-20 (×3): qty 40

## 2013-07-20 MED ORDER — NALOXONE HCL 0.4 MG/ML IJ SOLN
0.4000 mg | INTRAMUSCULAR | Status: DC | PRN
  Filled 2013-07-20: qty 1

## 2013-07-20 MED ORDER — GLYCOPYRROLATE 0.2 MG/ML IJ SOLN
INTRAMUSCULAR | Status: AC
  Filled 2013-07-20: qty 2

## 2013-07-20 MED ORDER — 0.9 % SODIUM CHLORIDE (POUR BTL) OPTIME
TOPICAL | Status: DC | PRN
  Administered 2013-07-20 (×3): 1000 mL

## 2013-07-20 MED ORDER — METOPROLOL SUCCINATE ER 25 MG PO TB24
25.0000 mg | ORAL_TABLET | Freq: Every day | ORAL | Status: DC
  Administered 2013-07-21 – 2013-07-24 (×4): 25 mg via ORAL
  Filled 2013-07-20 (×4): qty 1

## 2013-07-20 MED ORDER — SODIUM CHLORIDE 0.9 % IV BOLUS (SEPSIS)
500.0000 mL | Freq: Once | INTRAVENOUS | Status: AC
  Administered 2013-07-20: 500 mL via INTRAVENOUS

## 2013-07-20 SURGICAL SUPPLY — 92 items
APPLIER CLIP ROT 10 11.4 M/L (STAPLE)
BLADE SURG 10 STRL SS (BLADE) ×3 IMPLANT
BLADE SURG ROTATE 9660 (MISCELLANEOUS) IMPLANT
CANISTER SUCTION 2500CC (MISCELLANEOUS) ×3 IMPLANT
CELLS DAT CNTRL 66122 CELL SVR (MISCELLANEOUS) IMPLANT
CHLORAPREP W/TINT 26ML (MISCELLANEOUS) ×3 IMPLANT
CLIP APPLIE ROT 10 11.4 M/L (STAPLE) IMPLANT
COVER MAYO STAND STRL (DRAPES) ×3 IMPLANT
COVER SURGICAL LIGHT HANDLE (MISCELLANEOUS) ×6 IMPLANT
DRAPE PROXIMA HALF (DRAPES) ×3 IMPLANT
DRAPE SURG 17X23 STRL (DRAPES) ×3 IMPLANT
DRAPE UTILITY 15X26 W/TAPE STR (DRAPE) ×18 IMPLANT
DRAPE WARM FLUID 44X44 (DRAPE) ×3 IMPLANT
DRESSING ALLEVYN LIFE SACRUM (GAUZE/BANDAGES/DRESSINGS) ×3 IMPLANT
DRSG OPSITE POSTOP 4X10 (GAUZE/BANDAGES/DRESSINGS) IMPLANT
DRSG OPSITE POSTOP 4X6 (GAUZE/BANDAGES/DRESSINGS) ×3 IMPLANT
DRSG OPSITE POSTOP 4X8 (GAUZE/BANDAGES/DRESSINGS) IMPLANT
ELECT BLADE 6.5 EXT (BLADE) ×3 IMPLANT
ELECT CAUTERY BLADE 6.4 (BLADE) ×6 IMPLANT
ELECT REM PT RETURN 9FT ADLT (ELECTROSURGICAL) ×3
ELECTRODE REM PT RTRN 9FT ADLT (ELECTROSURGICAL) ×1 IMPLANT
GAUZE SPONGE 2X2 8PLY STRL LF (GAUZE/BANDAGES/DRESSINGS) ×1 IMPLANT
GEL ULTRASOUND 20GR AQUASONIC (MISCELLANEOUS) IMPLANT
GLOVE BIO SURGEON STRL SZ7 (GLOVE) ×6 IMPLANT
GLOVE BIO SURGEON STRL SZ7.5 (GLOVE) ×9 IMPLANT
GLOVE BIO SURGEON STRL SZ8 (GLOVE) ×6 IMPLANT
GLOVE BIOGEL PI IND STRL 6 (GLOVE) ×2 IMPLANT
GLOVE BIOGEL PI IND STRL 6.5 (GLOVE) ×2 IMPLANT
GLOVE BIOGEL PI IND STRL 7.0 (GLOVE) ×1 IMPLANT
GLOVE BIOGEL PI IND STRL 7.5 (GLOVE) ×7 IMPLANT
GLOVE BIOGEL PI IND STRL 8 (GLOVE) ×4 IMPLANT
GLOVE BIOGEL PI INDICATOR 6 (GLOVE) ×4
GLOVE BIOGEL PI INDICATOR 6.5 (GLOVE) ×4
GLOVE BIOGEL PI INDICATOR 7.0 (GLOVE) ×2
GLOVE BIOGEL PI INDICATOR 7.5 (GLOVE) ×14
GLOVE BIOGEL PI INDICATOR 8 (GLOVE) ×8
GLOVE ECLIPSE 8.0 STRL XLNG CF (GLOVE) ×6 IMPLANT
GLOVE SS BIOGEL STRL SZ 6.5 (GLOVE) ×1 IMPLANT
GLOVE SUPERSENSE BIOGEL SZ 6.5 (GLOVE) ×2
GOWN STRL REUS W/ TWL LRG LVL3 (GOWN DISPOSABLE) ×9 IMPLANT
GOWN STRL REUS W/ TWL XL LVL3 (GOWN DISPOSABLE) ×2 IMPLANT
GOWN STRL REUS W/TWL LRG LVL3 (GOWN DISPOSABLE) ×18
GOWN STRL REUS W/TWL XL LVL3 (GOWN DISPOSABLE) ×4
KIT BASIN OR (CUSTOM PROCEDURE TRAY) ×3 IMPLANT
KIT ROOM TURNOVER OR (KITS) ×3 IMPLANT
LEGGING LITHOTOMY PAIR STRL (DRAPES) IMPLANT
LIGASURE IMPACT 36 18CM CVD LR (INSTRUMENTS) IMPLANT
NS IRRIG 1000ML POUR BTL (IV SOLUTION) ×9 IMPLANT
PAD ARMBOARD 7.5X6 YLW CONV (MISCELLANEOUS) ×9 IMPLANT
PENCIL BUTTON HOLSTER BLD 10FT (ELECTRODE) ×3 IMPLANT
RELOAD PROXIMATE 75MM BLUE (ENDOMECHANICALS) ×6 IMPLANT
RTRCTR WOUND ALEXIS 18CM MED (MISCELLANEOUS)
SCALPEL HARMONIC ACE (MISCELLANEOUS) ×3 IMPLANT
SCISSORS LAP 5X35 DISP (ENDOMECHANICALS) ×3 IMPLANT
SET IRRIG TUBING LAPAROSCOPIC (IRRIGATION / IRRIGATOR) IMPLANT
SLEEVE ENDOPATH XCEL 5M (ENDOMECHANICALS) ×9 IMPLANT
SLEEVE SURGEON STRL (DRAPES) ×3 IMPLANT
SPECIMEN JAR LARGE (MISCELLANEOUS) ×3 IMPLANT
SPONGE GAUZE 2X2 STER 10/PKG (GAUZE/BANDAGES/DRESSINGS) ×2
SPONGE LAP 18X18 X RAY DECT (DISPOSABLE) ×3 IMPLANT
STAPLER PROXIMATE 75MM BLUE (STAPLE) ×3 IMPLANT
STAPLER VISISTAT 35W (STAPLE) ×3 IMPLANT
SURGILUBE 2OZ TUBE FLIPTOP (MISCELLANEOUS) IMPLANT
SUT MNCRL AB 4-0 PS2 18 (SUTURE) ×3 IMPLANT
SUT PDS AB 1 CT  36 (SUTURE)
SUT PDS AB 1 CT 36 (SUTURE) IMPLANT
SUT PDS AB 1 TP1 54 (SUTURE) ×6 IMPLANT
SUT PROLENE 2 0 CT2 30 (SUTURE) IMPLANT
SUT PROLENE 2 0 KS (SUTURE) IMPLANT
SUT SILK 2 0 (SUTURE) ×2
SUT SILK 2 0 SH CR/8 (SUTURE) ×3 IMPLANT
SUT SILK 2-0 18XBRD TIE 12 (SUTURE) ×1 IMPLANT
SUT SILK 3 0 (SUTURE) ×2
SUT SILK 3 0 SH CR/8 (SUTURE) ×6 IMPLANT
SUT SILK 3-0 18XBRD TIE 12 (SUTURE) ×1 IMPLANT
SUT VIC AB 3-0 SH 27 (SUTURE) ×4
SUT VIC AB 3-0 SH 27XBRD (SUTURE) ×2 IMPLANT
SYR BULB IRRIGATION 50ML (SYRINGE) ×3 IMPLANT
SYS LAPSCP GELPORT 120MM (MISCELLANEOUS) ×3
SYSTEM LAPSCP GELPORT 120MM (MISCELLANEOUS) ×1 IMPLANT
TAPE CLOTH SURG 4X10 WHT LF (GAUZE/BANDAGES/DRESSINGS) ×3 IMPLANT
TOWEL OR 17X26 10 PK STRL BLUE (TOWEL DISPOSABLE) ×3 IMPLANT
TRAY FOLEY CATH 16FRSI W/METER (SET/KITS/TRAYS/PACK) ×3 IMPLANT
TRAY LAPAROSCOPIC (CUSTOM PROCEDURE TRAY) ×3 IMPLANT
TRAY PROCTOSCOPIC FIBER OPTIC (SET/KITS/TRAYS/PACK) IMPLANT
TROCAR XCEL 12X100 BLDLESS (ENDOMECHANICALS) IMPLANT
TROCAR XCEL BLUNT TIP 100MML (ENDOMECHANICALS) IMPLANT
TROCAR XCEL NON-BLD 11X100MML (ENDOMECHANICALS) IMPLANT
TROCAR XCEL NON-BLD 5MMX100MML (ENDOMECHANICALS) ×3 IMPLANT
TUBE CONNECTING 12'X1/4 (SUCTIONS) ×1
TUBE CONNECTING 12X1/4 (SUCTIONS) ×2 IMPLANT
YANKAUER SUCT BULB TIP NO VENT (SUCTIONS) ×6 IMPLANT

## 2013-07-20 NOTE — H&P (Signed)
Natalie Chapman is an 73 y.o. female.   Chief Complaint:   Here for elective partial colectomy HPI:   She was noted to be anemic and also had blood in her stool. CT scan done in March of this year demonstrated a lesion in the ascending colon around the ileocecal valve. Colonoscopy was performed which demonstrated a circumferential lesion in that area. Biopsies were consistent with adenocarcinoma. CT scan did not show evidence of metastatic disease.   Past Medical History  Diagnosis Date  . Hypertension   . Chronic kidney disease     Stage 1 kidney insufficiency  . Arthritis     arthritis left hip-ambulates with walker  . Anemia   . Cataracts, bilateral 04-18-13    both -immmature  . Dysrhythmia 04-18-13    irregular heart beat "Atrial Fib"-Dr. Tripp,PCP follows  . Shortness of breath   . Venous stasis of both lower extremities     2010 - right worse left, but now healed  . H/O hiatal hernia   . Cancer     colon, diagnosed 2015  . Constipation     Past Surgical History  Procedure Laterality Date  . Tubal ligation    . Stomach stapling      [facility]- 80's  . Hernia repair      abdomen  . Tonsillectomy    . Colonoscopy N/A 05/02/2013    Procedure: COLONOSCOPY;  Surgeon: Jeryl Columbia, MD;  Location: [facility];  Service: Endoscopy;  Laterality: N/A;  . Cholecystectomy  1980's  . Esophagogastroduodenoscopy endoscopy      Family History  Problem Relation Age of Onset  . Heart disease Mother   . Heart disease Father    Social History:  reports that she quit smoking about 23 years ago. Her smoking use included Cigarettes. She smoked 0.00 packs per day. She has never used smokeless tobacco. She reports that she does not drink alcohol or use illicit drugs.  Allergies: No Known Allergies  Medications Prior to Admission  Medication Sig Dispense Refill  . acetaminophen (TYLENOL) 650 MG CR tablet Take 1,300 mg by mouth 2 (two) times daily as needed for pain.       Marland Kitchen aspirin EC 81 MG  tablet Take 81 mg by mouth daily.      Marland Kitchen atorvastatin (LIPITOR) 10 MG tablet Take 5 mg by mouth at bedtime.      . Cholecalciferol (VITAMIN D3) 400 UNITS tablet Take 800 Units by mouth daily.      . enalapril (VASOTEC) 2.5 MG tablet Take 2.5 mg by mouth daily.       . ferrous fumarate (HEMOCYTE - 106 MG FE) 325 (106 FE) MG TABS tablet Take 2 tablets by mouth daily.       . metoprolol succinate (TOPROL-XL) 25 MG 24 hr tablet Take 25 mg by mouth daily.         No results found for this or any previous visit (from the past 48 hour(s)). No results found.  Review of Systems  Constitutional: Negative.   Cardiovascular: Negative for chest pain.  Gastrointestinal: Positive for nausea and blood in stool. Negative for abdominal pain.    Pulse 91, temperature 97.2 F (36.2 C), resp. rate 20, SpO2 100.00%. Physical Exam  Constitutional: No distress.  Obese  HENT:  Head: Normocephalic and atraumatic.  Eyes: No scleral icterus.  Cardiovascular: Normal rate.   Irregular rhythm  Respiratory: Effort normal and breath sounds normal.  GI: Soft. She exhibits no mass. There  is no tenderness.  Upper midline scar.  RUQ scar.  Musculoskeletal: She exhibits edema.  Neurological: She is alert.  Skin: Skin is dry. There is pallor.     Assessment/Plan 1.  Right colon cancer 2.  Anemia secondary to above 3.  Chronic afib 4.  CKD  Plan:  Laparoscopic assisted partial colectomy.    Lavine Hargrove J 07/20/2013, 7:20 AM

## 2013-07-20 NOTE — Progress Notes (Signed)
Dr. Tamala Julian notified prior to transfer to the floor. Gave the BP's for the last 30 minutes, her Urine output only 50 in 1.5 hours. Also asked about level of monitoring needed. Dr. Tamala Julian is okay with patient going to telemetry and is okay with vital signs at this time. Patient stable and ready to go to her room.

## 2013-07-20 NOTE — Progress Notes (Signed)
Utilization review completed. Zuzu Befort, RN, BSN. 

## 2013-07-20 NOTE — Anesthesia Postprocedure Evaluation (Signed)
  Anesthesia Post-op Note  Patient: Natalie Chapman  Procedure(s) Performed: Procedure(s): LAPAROSCOPIC  ASSISTED RIGHT COLECTOMY (N/A)  Patient Location: PACU  Anesthesia Type:General  Level of Consciousness: awake, alert , sedated and patient cooperative  Airway and Oxygen Therapy: Patient Spontanous Breathing  Post-op Pain: mild  Post-op Assessment: Post-op Vital signs reviewed, Patient's Cardiovascular Status Stable, Respiratory Function Stable, Patent Airway, No signs of Nausea or vomiting and Pain level controlled  Post-op Vital Signs: stable  Last Vitals:  Filed Vitals:   07/20/13 1130  BP: 98/41  Pulse: 71  Temp:   Resp: 20    Complications: No apparent anesthesia complications

## 2013-07-20 NOTE — Anesthesia Preprocedure Evaluation (Signed)
Anesthesia Evaluation  Patient identified by MRN, date of birth, ID band Patient awake    Reviewed: Allergy & Precautions, H&P , NPO status , Patient's Chart, lab work & pertinent test results  Airway       Dental   Pulmonary former smoker,          Cardiovascular hypertension, + Peripheral Vascular Disease     Neuro/Psych    GI/Hepatic hiatal hernia,   Endo/Other    Renal/GU CRFRenal disease     Musculoskeletal   Abdominal   Peds  Hematology  (+) anemia ,   Anesthesia Other Findings Colon Ca  Reproductive/Obstetrics                           Anesthesia Physical Anesthesia Plan  ASA: III  Anesthesia Plan: General   Post-op Pain Management:    Induction: Intravenous  Airway Management Planned: Oral ETT  Additional Equipment:   Intra-op Plan:   Post-operative Plan: Extubation in OR  Informed Consent: I have reviewed the patients History and Physical, chart, labs and discussed the procedure including the risks, benefits and alternatives for the proposed anesthesia with the patient or authorized representative who has indicated his/her understanding and acceptance.     Plan Discussed with:   Anesthesia Plan Comments:         Anesthesia Quick Evaluation

## 2013-07-20 NOTE — Op Note (Signed)
Operative Note  Alexcia Schools female 73 y.o. 07/20/2013  PREOPERATIVE DX:  Right colon cancer  POSTOPERATIVE DX:  Same  PROCEDURE:  Laparoscopic assisted right colectomy         Surgeon: Odis Hollingshead   Assistants: Georganna Skeans, M.D.  Anesthesia: General endotracheal anesthesia  Indications:   This is a 73 year old female who was anemic and had blood in her stools. She underwent a colonoscopy which demonstrated a proximal descending colon lesion was biopsied and positive for adenocarcinoma. A tattoo mark was placed at the lesion. CT scan did not demonstrate evidence of metastatic disease.  CEA level is 2.8 which is within normal limits. She now presents for elective laparoscopic-assisted right colectomy. She's been seen by her cardiologist preoperatively.    Procedure Detail:  She was seen in the holding area. She was brought to the operative room placed supine on the operating table and a General Anesthetic was given. A Foley catheter was inserted. The abdominal wall was widely sterilely prepped and draped.  She was placed in slight reverse Trendelenburg position. A 5 mm incision was made in the left upper quadrant. Using a 5 mm Optiview trocar and laparoscope access was gained into the peritoneal cavity. A pneumoperitoneum was created. Inspection of the area underneath the trocar demonstrated no evidence of organ injury or bleeding. Adhesions were noted between the omentum and bowel and the upper midline area. A 5 mm trocar was placed in the subumbilical area. A 5 mm trocar is placed in the right lower quadrant. A 5 mm trocar was placed in the supraumbilical area just to the right of the midline.  The cecum was identified. Lateral attachments were divided sharply. The terminal ileum was mobile. Plane of dissection was kept anterior to the ovarian vessels and ureter. I continued to use sharp dissection and selective electrocautery to mobilize the right colon up to the hepatic flexure  area. Tattoo mark was identified. Using Harmonic scalpel and mobilized the proximal portion of the transverse colon. There were adhesions between the omentum and the liver were divided with the harmonic scalpel. The lesion appeared to be fairly close to the liver and somewhat adherent to it by way of adhesions.  Subsequently I made a limited incision through previous transverse subcostal scar and carried this through all layers until entering the peritoneal cavity. A wound protection device was placed. I mobilized the hepatic flexure and the proximal ascending colon by dividing adhesions between the colon and the liver. I further mobilized the transverse colon by separating the omentum from it. At this point I was able to bring up the terminal ileum, right colon, and proximal portion of the transverse colon into the wound. I divided the colon at the junction of the proximal one third distal two thirds of the transverse colon using the linear cutting stapler. I divided the ileum proximal to the ileocecal valve. I then resected the mesentery in a wedge shape using the LigaSure device and ligating the right colic and ileocolic vessels with silk suture. The specimen was then handed off the field. The tumor was easily palpable.  The area of dissection was inspected and hemostasis was adequate.  A side-to-side stapled anastomosis between the distal ileum and transverse colon was performed with the linear cutting stapler. There was no staple line bleeding. The common defect was closed in 2 layers. The first layer was full thickness running 3-0 Vicryl suture. The second layer was interrupted 3-0 silk sutures in a Lembert fashion. A crotch stitch was  placed using 3-0 silk suture. The anastomosis was patent, viable, under no tension, and demonstrated no obvious evidence of leak.  The abdominal cavity and copious irrigated was saline solution. There is no evidence of organ injury or bleeding. The fascia of the limited  right upper quadrant incision was closed with running double looped #1 PDS suture. Repeat laparoscopy was performed. A four-quadrant inspection was performed. There was no evidence of organ injury or bleeding. All trocars were removed and pneumoperitoneum was released.  A limited right upper quadrant skin incision was closed with staples. The trocar sites were closed with interrupted 4-0 Monocryl subcuticular stitches. Sterile dressings were applied to all wounds.  She tolerated the procedure without any apparent complications and was taken to the recovery room in satisfactory condition.  Estimated Blood Loss:  200 mL         Drains: none  Blood Given: none          Specimens: right colon and terminal ileum        Complications:  * No complications entered in OR log *         Disposition: PACU - hemodynamically stable.         Condition: stable

## 2013-07-20 NOTE — Transfer of Care (Signed)
Immediate Anesthesia Transfer of Care Note  Patient: Natalie Chapman  Procedure(s) Performed: Procedure(s): LAPAROSCOPIC  ASSISTED RIGHT COLECTOMY (N/A)  Patient Location: PACU  Anesthesia Type:General  Level of Consciousness: awake and responds to stimulation  Airway & Oxygen Therapy: Patient Spontanous Breathing and Patient connected to face mask oxygen  Post-op Assessment: Report given to PACU RN, Post -op Vital signs reviewed and stable and Patient moving all extremities  Post vital signs: Reviewed and stable  Complications: No apparent anesthesia complications

## 2013-07-20 NOTE — Progress Notes (Signed)
Dr Zella Richer paged regarding pt BP 87/38 while on morphine PCA.  Rosenbower ordered one time dose of 500 mL NS bolus to bring BP back up and to continue with PCA administration.  Will continue to monitor.

## 2013-07-21 ENCOUNTER — Encounter (HOSPITAL_COMMUNITY): Payer: Self-pay | Admitting: General Surgery

## 2013-07-21 DIAGNOSIS — I4891 Unspecified atrial fibrillation: Secondary | ICD-10-CM

## 2013-07-21 DIAGNOSIS — N189 Chronic kidney disease, unspecified: Secondary | ICD-10-CM

## 2013-07-21 LAB — BASIC METABOLIC PANEL
Anion gap: 14 (ref 5–15)
BUN: 14 mg/dL (ref 6–23)
CALCIUM: 8.6 mg/dL (ref 8.4–10.5)
CHLORIDE: 105 meq/L (ref 96–112)
CO2: 19 mEq/L (ref 19–32)
CREATININE: 1.2 mg/dL — AB (ref 0.50–1.10)
GFR, EST AFRICAN AMERICAN: 51 mL/min — AB (ref >90)
GFR, EST NON AFRICAN AMERICAN: 44 mL/min — AB (ref >90)
Glucose, Bld: 161 mg/dL — ABNORMAL HIGH (ref 70–99)
Potassium: 4.3 mEq/L (ref 3.7–5.3)
Sodium: 138 mEq/L (ref 137–147)

## 2013-07-21 LAB — POCT I-STAT 4, (NA,K, GLUC, HGB,HCT)
Glucose, Bld: 113 mg/dL — ABNORMAL HIGH (ref 70–99)
HCT: 25 % — ABNORMAL LOW (ref 36.0–46.0)
Hemoglobin: 8.5 g/dL — ABNORMAL LOW (ref 12.0–15.0)
Potassium: 3.8 mEq/L (ref 3.7–5.3)
Sodium: 138 mEq/L (ref 137–147)

## 2013-07-21 LAB — CBC
HCT: 24.7 % — ABNORMAL LOW (ref 36.0–46.0)
Hemoglobin: 7.9 g/dL — ABNORMAL LOW (ref 12.0–15.0)
MCH: 28.5 pg (ref 26.0–34.0)
MCHC: 32 g/dL (ref 30.0–36.0)
MCV: 89.2 fL (ref 78.0–100.0)
PLATELETS: 225 10*3/uL (ref 150–400)
RBC: 2.77 MIL/uL — ABNORMAL LOW (ref 3.87–5.11)
RDW: 14.8 % (ref 11.5–15.5)
WBC: 7 10*3/uL (ref 4.0–10.5)

## 2013-07-21 MED ORDER — BOOST / RESOURCE BREEZE PO LIQD
1.0000 | Freq: Three times a day (TID) | ORAL | Status: DC
  Administered 2013-07-22 – 2013-07-24 (×6): 1 via ORAL

## 2013-07-21 NOTE — Evaluation (Signed)
Occupational Therapy Evaluation Patient Details Name: Natalie Chapman MRN: [id number] DOB: Feb 15, 1940 Today's Date: 07/21/2013    History of Present Illness 73 yo female admitted 7/09-15 for partial colectomy for newly diagnosed adencarcinoma.   Clinical Impression   Pt admitted with above. She demonstrates the below listed deficits and will benefit from continued OT to maximize safety and independence with BADLs.  Pt presents to OT with generalized weakness and pain in abdomen 3/10.  Currently, she requires min - mod A for BADLs, and fatigues quickly.        Follow Up Recommendations  Home health OT;Supervision/Assistance - 24 hour - unless progress is slower than expected then, may need SNF    Equipment Recommendations  None recommended by OT    Recommendations for Other Services       Precautions / Restrictions Precautions Precautions: Fall Precaution Comments: monitor HR/Sats      Mobility Bed Mobility Overal bed mobility: Needs Assistance Bed Mobility: Supine to Sit     Supine to sit: Min assist;HOB elevated     General bed mobility comments: use of rail, pt in sitting position by [facility] so did not attempt to roll. Extra time to move to edge, bed pad pulled x 1 to get  hips to edge of bed.Pt reports she plans to sleep in recliner.  Transfers Overall transfer level: Needs assistance Equipment used: Rolling walker (2 wheeled) Transfers: Sit to/from [facility] Sit to Stand: Min guard Stand pivot transfers: Min assist       General transfer comment: Requires increased time to complete task    Balance Overall balance assessment: Needs assistance Sitting-balance support: Feet supported Sitting balance-Leahy Scale: Good     Standing balance support: Bilateral upper extremity supported Standing balance-Leahy Scale: Poor                              ADL Overall ADL's : Needs assistance/impaired Eating/Feeding: Independent;Sitting    Grooming: Wash/dry hands;Wash/dry face;Oral care;Applying deodorant;Brushing hair;Set up;Sitting   Upper Body Bathing: Minimal assitance;Sitting   Lower Body Bathing: Moderate assistance;Sit to/from stand   Upper Body Dressing : Minimal assistance;Sitting   Lower Body Dressing: Moderate assistance;Sit to/from stand   Toilet Transfer: Minimal assistance;Ambulation;BSC   Toileting- Clothing Manipulation and Hygiene: Minimal assistance;Sit to/from stand       Functional mobility during ADLs: Minimal assistance;Rolling walker General ADL Comments: Pt is very motivated.  Fatigues quickly      Vision                     Perception     Praxis      Pertinent Vitals/Pain See vitals flow sheet   HR 132 with activity      Hand Dominance Right   Extremity/Trunk Assessment Upper Extremity Assessment Upper Extremity Assessment: Overall WFL for tasks assessed   Lower Extremity Assessment Lower Extremity Assessment: Defer to PT evaluation   Cervical / Trunk Assessment Cervical / Trunk Assessment: Normal   Communication Communication Communication: No difficulties   Cognition Arousal/Alertness: Awake/alert Behavior During Therapy: WFL for tasks assessed/performed Overall Cognitive Status: Within Functional Limits for tasks assessed                     General Comments       Exercises       Shoulder Instructions      Home Living Family/patient expects to be discharged to:: Private residence Living  Arrangements: Children;Other relatives Available Help at Discharge: Family Type of Home: House Home Access: Ramped entrance     Home Layout: One level     Bathroom Shower/Tub: Tub/shower unit;Walk-in shower (uses tub/shower) Shower/tub characteristics: Architectural technologist: Standard Bathroom Accessibility: Yes How Accessible: Accessible via walker Home Equipment: [place] - 2 wheels;Bedside commode;Shower seat;Wheelchair - manual;Grab bars -  tub/shower          Prior Functioning/Environment Level of Independence: Needs assistance  Gait / Transfers Assistance Needed: ambulates 200' max PTA, usually slept in lift chair, home alone  while daughter worked ADL's / Land Needed: Pt requires assist to don Lt sock due to having Lt hip arthritis        OT Diagnosis: Generalized weakness;Acute pain   OT Problem List: Decreased strength;Decreased activity tolerance;Impaired balance (sitting and/or standing);Decreased knowledge of use of DME or AE;Obesity;Pain   OT Treatment/Interventions: Self-care/ADL training;DME and/or AE instruction;Therapeutic activities;Patient/family education;Balance training    OT Goals(Current goals can be found in the care plan section) Acute Rehab OT Goals Patient Stated Goal: I want to go home, be able tostep into the tub. OT Goal Formulation: With patient Time For Goal Achievement: 08/04/13 Potential to Achieve Goals: Good  OT Frequency: Min 2X/week   Barriers to D/C:            Co-evaluation              End of Session    Activity Tolerance: Patient tolerated treatment well Patient left: in chair;with call bell/phone within reach   Time: 8811-0315 OT Time Calculation (min): 22 min Charges:  OT General Charges $OT Visit: 1 Procedure OT Evaluation $Initial OT Evaluation Tier I: 1 Procedure OT Treatments $Self Care/Home Management : 8-22 mins G-Codes:    Antionetta Ator M 07/24/2013, 11:26 AM

## 2013-07-21 NOTE — Progress Notes (Signed)
1 Day Post-Op  Subjective: Pain well controlled.  No nausea.  Objective: Vital signs in last 24 hours: Temp:  [96.9 F (36.1 C)-100 F (37.8 C)] 100 F (37.8 C) (07/10 0414) Pulse Rate:  [61-95] 86 (07/10 0414) Resp:  [14-33] 18 (07/10 0414) BP: (32-114)/(21-62) 107/47 mmHg (07/10 0414) SpO2:  [88 %-100 %] 92 % (07/10 0414) Weight:  [267 lb (121.11 kg)] 267 lb (121.11 kg) (07/09 1651) Last BM Date: 07/19/13  Intake/Output from previous day: 07/09 0701 - 07/10 0700 In: 1600 [I.V.:1100; IV Piggyback:500] Out: 703 [Urine:770; Blood:100] Intake/Output this shift:    PE: General- In NAD CV-irregular rate, irregular rhythm Abdomen-soft, RUQ incision clean, other dressings dry, few bowel sounds  Lab Results:   Recent Labs  07/20/13 1310 07/21/13 0505  WBC 6.9 7.0  HGB 8.3* 7.9*  HCT 26.6* 24.7*  PLT 209 225   BMET  Recent Labs  07/20/13 1310 07/21/13 0505  NA  --  138  K  --  4.3  CL  --  105  CO2  --  19  GLUCOSE  --  161*  BUN  --  14  CREATININE 1.31* 1.20*  CALCIUM  --  8.6   PT/INR No results found for this basename: LABPROT, INR,  in the last 72 hours Comprehensive Metabolic Panel:    Component Value Date/Time   NA 138 07/21/2013 0505   NA 139 07/12/2013 1511   K 4.3 07/21/2013 0505   K 5.5* 07/12/2013 1511   CL 105 07/21/2013 0505   CL 104 07/12/2013 1511   CO2 19 07/21/2013 0505   CO2 21 07/12/2013 1511   BUN 14 07/21/2013 0505   BUN 26* 07/12/2013 1511   CREATININE 1.20* 07/21/2013 0505   CREATININE 1.31* 07/20/2013 1310   GLUCOSE 161* 07/21/2013 0505   GLUCOSE 96 07/12/2013 1511   CALCIUM 8.6 07/21/2013 0505   CALCIUM 9.5 07/12/2013 1511   AST 9 07/12/2013 1511   ALT <5 07/12/2013 1511   ALKPHOS 56 07/12/2013 1511   BILITOT 0.7 07/12/2013 1511   PROT 8.1 07/12/2013 1511   ALBUMIN 3.7 07/12/2013 1511     Studies/Results: No results found.  Anti-infectives: Anti-infectives   Start     Dose/Rate Route Frequency Ordered Stop   07/20/13 2000  cefoTEtan (CEFOTAN) 2  g in dextrose 5 % 50 mL IVPB     2 g 100 mL/hr over 30 Minutes Intravenous Every 12 hours 07/20/13 1209 07/20/13 2327   07/19/13 1452  cefoTEtan (CEFOTAN) 1 g in dextrose 5 % 50 mL IVPB  Status:  Discontinued     1 g 100 mL/hr over 30 Minutes Intravenous Every 12 hours 07/19/13 1452 07/20/13 1158      Assessment Principal Problem:   Colon cancer-ascending colon s/p lap assisted right colectomy 07/20/13-stable overnight; some low grade fever this AM likely due to atelectasis Active Problems:   Atrial fibrillation-rate controlled; on Metoprolol   Morbid obesity   Acute on chronic Anemia   Chronic kidney disease (CKD), stage I-Cr 1.2 (1.5 preop)    LOS: 1 day   Plan: Incentive spirometry.  OOB. PT and OT consults.  Clear liquids.   Natalie Chapman 07/21/2013

## 2013-07-21 NOTE — Evaluation (Signed)
Physical Therapy Evaluation Patient Details Name: Natalie Chapman MRN: [id number] DOB: 30-May-1940 Today's Date: 07/21/2013   History of Present Illness  73 yo female admitted 7/09-15 for partial colectomy for newly diagnosed adencarcinoma.  Clinical Impression  Pt tolerated mobilizing to recliner with 1 assist. Pt will benefit from PT to address problems listed in note below.pt is very motivated. Per daughter, pt will have 24/7 caregivers. Pt has all DME , WC and ramp.     Follow Up Recommendations Home health PT;Supervision/Assistance - 24 hour vs SNF, per daughter, will consider SNF depending on progress over next few days.    Equipment Recommendations  None recommended by PT    Recommendations for Other Services       Precautions / Restrictions Precautions Precautions: Fall Precaution Comments: monitor HR/Sats      Mobility  Bed Mobility Overal bed mobility: Needs Assistance Bed Mobility: Supine to Sit     Supine to sit: Min assist;HOB elevated     General bed mobility comments: use of rail, pt in sitting position by [facility] so did not attempt to roll. Extra time to move to edge, bed pad pulled x 1 to get  hips to edge of bed.Pt reports she plans to sleep in recliner.  Transfers Overall transfer level: Needs assistance   Transfers: Sit to/from Stand;Stand Pivot Transfers Sit to Stand: Min assist Stand pivot transfers: Min assist       General transfer comment: pt used bed rail to stand, able to reach tand hold on too recliner armrests to pivot to recliner. Extra time for mobility.  Ambulation/Gait                Stairs            Wheelchair Mobility    Modified Rankin (Stroke Patients Only)       Balance Overall balance assessment: Needs assistance Sitting-balance support: No upper extremity supported;Feet supported Sitting balance-Leahy Scale: Fair                                       Pertinent Vitals/Pain HR 91-127 during  mobil;ity. sats dropped to 85% on RA during mobility. Returned to >90 on 2 L.    Home Living Family/patient expects to be discharged to:: Private residence Living Arrangements: Children;Other relatives Available Help at Discharge: Family Type of Home: House Home Access: Ramped entrance     Home Layout: One level Home Equipment: [place] - 2 wheels;Bedside commode;Shower seat;Wheelchair - manual (lift chair)      Prior Function Level of Independence: Independent with assistive device(s)   Gait / Transfers Assistance Needed: ambulates 200' max PTA, usually slept in lift chair, home alone  while daughter worked           Journalist, newspaper        Extremity/Trunk Assessment               Lower Extremity Assessment: Generalized weakness      Cervical / Trunk Assessment: Normal  Communication   Communication: No difficulties  Cognition Arousal/Alertness: Awake/alert Behavior During Therapy: WFL for tasks assessed/performed Overall Cognitive Status: Within Functional Limits for tasks assessed                      General Comments      Exercises        Assessment/Plan    PT Assessment Patient needs continued  PT services  PT Diagnosis Difficulty walking;Generalized weakness   PT Problem List Decreased strength;Decreased activity tolerance;Decreased mobility;Pain;Decreased knowledge of precautions  PT Treatment Interventions DME instruction;Gait training;Functional mobility training;Therapeutic activities;Therapeutic exercise;Patient/family education   PT Goals (Current goals can be found in the Care Plan section) Acute Rehab PT Goals Patient Stated Goal: I want to go home, be able tostep into the tub. PT Goal Formulation: With patient/family Time For Goal Achievement: 08/04/13 Potential to Achieve Goals: Good    Frequency Min 3X/week   Barriers to discharge        Co-evaluation               End of Session Equipment Utilized During  Treatment: Gait belt Activity Tolerance: Patient tolerated treatment well Patient left: with call bell/phone within reach;with family/visitor present (on chair alarm pad, no alarm box, in room. RN aware.) Nurse Communication: Mobility status         Time: [phone] PT Time Calculation (min): 38 min   Charges:   PT Evaluation $Initial PT Evaluation Tier I: 1 Procedure PT Treatments $Therapeutic Activity: 38-52 mins   PT G Codes:          Natalie Chapman 07/21/2013, 8:51 AM Natalie Chapman PT [phone]

## 2013-07-21 NOTE — Progress Notes (Signed)
INITIAL NUTRITION ASSESSMENT  DOCUMENTATION CODES Per approved criteria  -Morbid Obesity   INTERVENTION:  Resource Breeze po TID, each supplement provides 250 kcal and 9 grams of protein  NUTRITION DIAGNOSIS: Increased nutrient needs related to recent surgery as evidenced by estimated calorie and protein needs.   Goal: Intake to meet >90% of estimated nutrition needs.  Monitor:  Diet advancement, PO intake, labs, weight trend.  Reason for Assessment: MST  73 y.o. female  Admitting Dx: Colon cancer  ASSESSMENT: 73 yo female admitted for elective partial colectomy for recently diagnosed colon cancer.  Patient reports that she used to weight over 400 lbs 2 years ago. She has gradually lost weight over the past 2 years with a decrease in intake. She says she was not trying to lose weight, "it just came off." She has had a poor appetite over the past few months with variable intake.   Patient with increased nutrition needs to support healing and recovery. She agreed to try [employer] between meals to maximize protein intake. Diet just advanced to clear liquids.  Nutrition Focused Physical Exam:  Subcutaneous Fat:  Orbital Region: WNL Upper Arm Region: WNL Thoracic and Lumbar Region: WNL  Muscle:  Temple Region: WNL Clavicle Bone Region: moderate depletion Clavicle and Acromion Bone Region: WNL Scapular Bone Region: WNL Dorsal Hand: WNL Patellar Region: WNL Anterior Thigh Region: WNL Posterior Calf Region: WNL  Edema: none    Height: Ht Readings from Last 1 Encounters:  07/12/13 5\' 1"  (1.549 m)    Weight: Wt Readings from Last 1 Encounters:  07/20/13 267 lb (121.11 kg)    Ideal Body Weight: 47.7 kg  % Ideal Body Weight: 254%  Wt Readings from Last 10 Encounters:  07/20/13 267 lb (121.11 kg)  07/20/13 267 lb (121.11 kg)  07/12/13 264 lb 9.6 oz (120.022 kg)  06/19/13 266 lb 12.8 oz (121.02 kg)  05/12/13 270 lb (122.471 kg)  05/02/13 280 lb  (127.007 kg)  05/02/13 280 lb (127.007 kg)    Usual Body Weight: 280 lb 3 months ago  % Usual Body Weight: 95%  BMI:  Body mass index is 50.48 kg/(m^2). class 3, extreme/morbid obesity  Estimated Nutritional Needs: Kcal: 1800-2000 Protein: 100-120 gm Fluid: 2 L  Skin: surgical incision to abdomen  Diet Order: Clear Liquid  EDUCATION NEEDS: -Education needs addressed   Intake/Output Summary (Last 24 hours) at 07/21/13 1223 Last data filed at 07/21/13 0300  Gross per 24 hour  Intake    500 ml  Output    600 ml  Net   -100 ml    Last BM: 7/8   Labs:   Recent Labs Lab 07/20/13 0802 07/20/13 1310 07/21/13 0505  NA 138  --  138  K 3.8  --  4.3  CL  --   --  105  CO2  --   --  19  BUN  --   --  14  CREATININE  --  1.31* 1.20*  CALCIUM  --   --  8.6  GLUCOSE 113*  --  161*    CBG (last 3)  No results found for this basename: GLUCAP,  in the last 72 hours  Scheduled Meds: . alvimopan  12 mg Oral BID  . enoxaparin (LOVENOX) injection  40 mg Subcutaneous Q24H  . metoprolol succinate  25 mg Oral Daily  . morphine   Intravenous 6 times per day  . pantoprazole (PROTONIX) IV  40 mg Intravenous Q24H    Continuous  Infusions: . dextrose 5 % and 0.9% NaCl 100 mL/hr at 07/21/13 1043    Past Medical History  Diagnosis Date  . Hypertension   . Anemia   . Cataracts, bilateral 04-18-13    both -immmature  . Dysrhythmia 04-18-13    irregular heart beat "Atrial Fib"-Dr. Tripp,PCP follows  . Shortness of breath   . Venous stasis of both lower extremities     2010 - right worse left, but now healed  . H/O hiatal hernia   . Constipation   . High cholesterol   . Atrial fibrillation   . Arthritis     "left hip" (07/20/2013)  . Chronic kidney disease (CKD), stage I   . Colon cancer dx'd 2015    Past Surgical History  Procedure Laterality Date  . Tubal ligation    . Stomach stapling      [facility]- 80's  . Hernia repair      abdomen  . Tonsillectomy    .  Colonoscopy N/A 05/02/2013    Procedure: COLONOSCOPY;  Surgeon: Jeryl Columbia, MD;  Location: [facility];  Service: Endoscopy;  Laterality: N/A;  . Cholecystectomy  1980's  . Esophagogastroduodenoscopy endoscopy    . Laparoscopic right colectomy  07/20/2013  . Incisional hernia repair      Molli Barrows, RD, LDN, [place] Pager [phone] After Hours Pager [phone]

## 2013-07-22 MED ORDER — MORPHINE SULFATE 2 MG/ML IJ SOLN: 1.0000 mg | INTRAMUSCULAR | Status: DC | PRN

## 2013-07-22 MED ORDER — PANTOPRAZOLE SODIUM 40 MG PO TBEC
40.0000 mg | DELAYED_RELEASE_TABLET | Freq: Every day | ORAL | Status: DC
  Administered 2013-07-22 – 2013-07-24 (×3): 40 mg via ORAL
  Filled 2013-07-22 (×5): qty 1

## 2013-07-22 NOTE — Progress Notes (Signed)
Pt PCA d/c'd Natalie Chapman witnessed 9cc waste  Of Morphine in sink

## 2013-07-22 NOTE — Progress Notes (Signed)
2 Days Post-Op  Subjective: Feels good No flatus yet but denies bloating or nausea  Objective: Vital signs in last 24 hours: Temp:  [97.3 F (36.3 C)-98.3 F (36.8 C)] 97.3 F (36.3 C) (07/11 0538) Pulse Rate:  [74-102] 84 (07/11 0538) Resp:  [17-22] 20 (07/11 0538) BP: (98-128)/(49-65) 98/49 mmHg (07/11 0538) SpO2:  [95 %-100 %] 97 % (07/11 0538) Last BM Date: 07/19/13  Intake/Output from previous day: 07/10 0701 - 07/11 0700 In: 1080 [P.O.:1080] Out: 475 [Urine:475] Intake/Output this shift:    Abdomen soft, non-distended, +BS  Lab Results:   Recent Labs  07/20/13 1310 07/21/13 0505  WBC 6.9 7.0  HGB 8.3* 7.9*  HCT 26.6* 24.7*  PLT 209 225   BMET  Recent Labs  07/20/13 0802 07/20/13 1310 07/21/13 0505  NA 138  --  138  K 3.8  --  4.3  CL  --   --  105  CO2  --   --  19  GLUCOSE 113*  --  161*  BUN  --   --  14  CREATININE  --  1.31* 1.20*  CALCIUM  --   --  8.6   PT/INR No results found for this basename: LABPROT, INR,  in the last 72 hours ABG No results found for this basename: PHART, PCO2, PO2, HCO3,  in the last 72 hours  Studies/Results: No results found.  Anti-infectives: Anti-infectives   Start     Dose/Rate Route Frequency Ordered Stop   07/20/13 2000  cefoTEtan (CEFOTAN) 2 g in dextrose 5 % 50 mL IVPB     2 g 100 mL/hr over 30 Minutes Intravenous Every 12 hours 07/20/13 1209 07/20/13 2327   07/19/13 1452  cefoTEtan (CEFOTAN) 1 g in dextrose 5 % 50 mL IVPB  Status:  Discontinued     1 g 100 mL/hr over 30 Minutes Intravenous Every 12 hours 07/19/13 1452 07/20/13 1158      Assessment/Plan: s/p Procedure(s): LAPAROSCOPIC  ASSISTED RIGHT COLECTOMY (N/A)  Will keep on clears per patient request.  Continue Entereg protocol Decrease IVF D/c foley  LOS: 2 days    Shellie Goettl A 07/22/2013

## 2013-07-23 MED ORDER — OXYCODONE HCL 5 MG PO TABS
5.0000 mg | ORAL_TABLET | ORAL | Status: DC | PRN
  Administered 2013-07-23: 10 mg via ORAL
  Filled 2013-07-23: qty 2

## 2013-07-23 NOTE — Progress Notes (Signed)
Patient ID: Natalie Chapman, female   DOB: 16-Oct-1940, 73 y.o.   MRN: [id number]  Subjective: Sitting up in a chair.  Minimal pain.  Afebrile.  Some walking in the room, had home PT and also has arthritis, will order PT to eval.    Objective:  Vital signs:  Filed Vitals:   07/22/13 1413 07/22/13 1849 07/22/13 2100 07/23/13 0500  BP: 105/46  115/60 111/48  Pulse: 84  85 83  Temp: 97 F (36.1 C)  97.4 F (36.3 C) 97.7 F (36.5 C)  TempSrc: Oral     Resp: 18  18 16   Height:  5\' 3"  (1.6 m)    Weight:  269 lb 14.4 oz (122.426 kg)  269 lb 3.2 oz (122.108 kg)  SpO2: 97%  100% 95%    Last BM Date: 07/23/13  Intake/Output   Yesterday:  07/11 0701 - 07/12 0700 In: 237 [P.O.:237] Out: 200 [Urine:200] This shift:  Total I/O In: 240 [P.O.:240] Out: -   Physical Exam:  General: Pt awake/alert/oriented x4 in no acute distress Chest: cta.  No chest wall pain w good excursion CV:  s1s2 irregularly irregular.  2/2 pulses.  No edema.  MS: Normal AROM mjr joints.  No obvious deformity Abdomen: Soft.  Nondistended.  Appropriately tender.  Dressings removed.  RLQ staples in place, lap sites with approximated edges, no drainage or erythema.   No evidence of peritonitis.  No incarcerated hernias.   Problem List:   Principal Problem:   Colon cancer-ascending colon s/p lap assisted right colectomy 07/20/13 Active Problems:   Atrial fibrillation   Morbid obesity   Anemia   Chronic kidney disease (CKD), stage I    Results:   Labs: No results found for this or any previous visit (from the past 20 hour(s)).  Imaging / Studies: No results found.  Scheduled Meds: . alvimopan  12 mg Oral BID  . enoxaparin (LOVENOX) injection  40 mg Subcutaneous Q24H  . feeding supplement (RESOURCE BREEZE)  1 Container Oral TID BM  . metoprolol succinate  25 mg Oral Daily  . pantoprazole  40 mg Oral Daily   Continuous Infusions: . dextrose 5 % and 0.9% NaCl 100 mL/hr at 07/21/13 1043   PRN  Meds:.morphine injection, naloxone, ondansetron (ZOFRAN) IV, ondansetron (ZOFRAN) IV, ondansetron, sodium chloride   Antibiotics: Anti-infectives   Start     Dose/Rate Route Frequency Ordered Stop   07/20/13 2000  cefoTEtan (CEFOTAN) 2 g in dextrose 5 % 50 mL IVPB     2 g 100 mL/hr over 30 Minutes Intravenous Every 12 hours 07/20/13 1209 07/20/13 2327   07/19/13 1452  cefoTEtan (CEFOTAN) 1 g in dextrose 5 % 50 mL IVPB  Status:  Discontinued     1 g 100 mL/hr over 30 Minutes Intravenous Every 12 hours 07/19/13 1452 07/20/13 1158      Assessment  Colon cancer-ascending colon s/p lap assisted right colectomy 07/20/13 -had a BM, tolerating clears.   -Advance diet as tolerated -IS(pulling 1543ml) -mobilize, PT eval d/t poor mobilization  -SCD/lovenox -SLIV Atrial fibrillation -rate controlled; on Metoprolol  -tele reviewed Acute on chronic Anemia -drifted down some, repeat CBC in AM  Chronic kidney disease (CKD), stage I-Cr 1.2 (1.5 preop)  Erby Pian, [facility] Surgery Pager [phone] Office [phone]  07/23/2013 11:28 AM

## 2013-07-23 NOTE — Progress Notes (Signed)
Bowel function returning, PT eval. Patient examined and I agree with the assessment and plan  Georganna Skeans, MD, MPH, FACS Trauma: [phone] General Surgery: [phone]  07/23/2013 1:42 PM

## 2013-07-24 ENCOUNTER — Encounter (INDEPENDENT_AMBULATORY_CARE_PROVIDER_SITE_OTHER): Payer: Self-pay

## 2013-07-24 LAB — CBC
HEMATOCRIT: 23.8 % — AB (ref 36.0–46.0)
HEMOGLOBIN: 7.6 g/dL — AB (ref 12.0–15.0)
MCH: 28.9 pg (ref 26.0–34.0)
MCHC: 31.9 g/dL (ref 30.0–36.0)
MCV: 90.5 fL (ref 78.0–100.0)
Platelets: 197 10*3/uL (ref 150–400)
RBC: 2.63 MIL/uL — ABNORMAL LOW (ref 3.87–5.11)
RDW: 15.1 % (ref 11.5–15.5)
WBC: 4.5 10*3/uL (ref 4.0–10.5)

## 2013-07-24 MED ORDER — OXYCODONE HCL 5 MG PO TABS: 5.0000 mg | ORAL_TABLET | ORAL | Status: DC | PRN

## 2013-07-24 NOTE — Progress Notes (Signed)
4 Days Post-Op  Subjective: Tolerating solid food.  Bowels moving.  Minimal pain.  Can get OOB herself and walk with walker.  Objective: Vital signs in last 24 hours: Temp:  [97.4 F (36.3 C)-98.5 F (36.9 C)] 98.5 F (36.9 C) (07/13 0500) Pulse Rate:  [66-92] 92 (07/13 0500) Resp:  [18] 18 (07/13 0500) BP: (99-125)/(40-48) 99/40 mmHg (07/13 0500) SpO2:  [96 %-100 %] 96 % (07/13 0500) Weight:  [267 lb 8 oz (121.337 kg)] 267 lb 8 oz (121.337 kg) (07/13 0500) Last BM Date: 07/23/13  Intake/Output from previous day: 07/12 0701 - 07/13 0700 In: 1000 [P.O.:1000] Out: 200 [Urine:200] Intake/Output this shift:    PE: General- In NAD Abdomen-soft, non-tender, incisions clean and intact  Lab Results:   Recent Labs  07/24/13 0700  WBC 4.5  HGB 7.6*  HCT 23.8*  PLT 197   BMET No results found for this basename: NA, K, CL, CO2, GLUCOSE, BUN, CREATININE, CALCIUM,  in the last 72 hours PT/INR No results found for this basename: LABPROT, INR,  in the last 72 hours Comprehensive Metabolic Panel:    Component Value Date/Time   NA 138 07/21/2013 0505   NA 138 07/20/2013 0802   K 4.3 07/21/2013 0505   K 3.8 07/20/2013 0802   CL 105 07/21/2013 0505   CL 104 07/12/2013 1511   CO2 19 07/21/2013 0505   CO2 21 07/12/2013 1511   BUN 14 07/21/2013 0505   BUN 26* 07/12/2013 1511   CREATININE 1.20* 07/21/2013 0505   CREATININE 1.31* 07/20/2013 1310   GLUCOSE 161* 07/21/2013 0505   GLUCOSE 113* 07/20/2013 0802   CALCIUM 8.6 07/21/2013 0505   CALCIUM 9.5 07/12/2013 1511   AST 9 07/12/2013 1511   ALT <5 07/12/2013 1511   ALKPHOS 56 07/12/2013 1511   BILITOT 0.7 07/12/2013 1511   PROT 8.1 07/12/2013 1511   ALBUMIN 3.7 07/12/2013 1511     Studies/Results: No results found.  Anti-infectives: Anti-infectives   Start     Dose/Rate Route Frequency Ordered Stop   07/20/13 2000  cefoTEtan (CEFOTAN) 2 g in dextrose 5 % 50 mL IVPB     2 g 100 mL/hr over 30 Minutes Intravenous Every 12 hours 07/20/13 1209 07/20/13  2327   07/19/13 1452  cefoTEtan (CEFOTAN) 1 g in dextrose 5 % 50 mL IVPB  Status:  Discontinued     1 g 100 mL/hr over 30 Minutes Intravenous Every 12 hours 07/19/13 1452 07/20/13 1158      Assessment Principal Problem:   Colon cancer-ascending colon s/p lap assisted right colectomy 07/20/13-pathology pending; doing well. Active Problems:   Atrial fibrillation-remains rate-controlled   Morbid obesity   Anemia-hemoglobin 7.5; asymptomatic   Chronic kidney disease (CKD), stage I     LOS: 4 days   Plan: Home today. HHPT.  Instructions given to her.   Mianna Iezzi J 07/24/2013

## 2013-07-24 NOTE — Progress Notes (Signed)
Occupational Therapy Treatment Patient Details Name: Tyjah Hai MRN: [id number] DOB: 08/04/1940 Today's Date: 07/24/2013    History of present illness 73 yo female admitted 7/09-15 for partial colectomy for newly diagnosed adencarcinoma.   OT comments  Educated pt on use of AE for LB ADL. Pt able to return demonstrate. Ready for D/C and continue with HHOT.  Follow Up Recommendations  Home health OT;Supervision/Assistance - 24 hour    Equipment Recommendations  None recommended by OT    Recommendations for Other Services      Precautions / Restrictions Precautions Precautions: Fall Precaution Comments: arthritic left hip       Mobility Bed Mobility               General bed mobility comments: pt in recliner on arrival  Transfers     Transfers: Sit to/from Stand Sit to Stand: Min guard         General transfer comment: cues for safety, increased time    Balance             Standing balance-Leahy Scale: Fair                     ADL                       Lower Body Dressing: Supervision/safety;Set up;Sit to/from stand (Educated pt on use of reacher and wide sock aid for [employer] dress)                        Vision                     Perception     Praxis      Cognition   Behavior During Therapy: [facility] for tasks assessed/performed Overall Cognitive Status: Within Functional Limits for tasks assessed                       Extremity/Trunk Assessment               Exercises General Exercises - Lower Extremity Hip Flexion/Marching:  (10 on LLE, 15 on RLE)   Shoulder Instructions       General Comments      Pertinent Vitals/ Pain       No c/o pain  Home Living                                          Prior Functioning/Environment              Frequency Min 2X/week     Progress Toward Goals  OT Goals(current goals can now be found in the care plan section)  Progress towards OT goals: Progressing toward goals  Acute Rehab OT Goals Patient Stated Goal: I want to go home, be able tostep into the tub. OT Goal Formulation: With patient Time For Goal Achievement: 08/04/13 Potential to Achieve Goals: Good  Plan Discharge plan remains appropriate    Co-evaluation                 End of Session     Activity Tolerance Patient tolerated treatment well   Patient Left in chair;with call bell/phone within reach   Nurse Communication Mobility status        Time:  - 1058-11:15 17 min  Charges: OT General Charges $OT Visit: 1 Procedure OT Treatments $Self Care/Home Management : 8-22 mins  Undrea Shipes,HILLARY 07/24/2013, 2:00 PM   [facility], OTR/L  [phone] 07/24/2013

## 2013-07-24 NOTE — Progress Notes (Unsigned)
Patient ID: Natalie Chapman, female   DOB: 18-Jan-1940, 73 y.o.   MRN: [id number] Pt was d/c from hospital today.  Her son brought Rx for Oxycodone to the office for Dr. Zella Richer to sign.

## 2013-07-24 NOTE — Discharge Summary (Signed)
Physician Discharge Summary  Patient ID: Natalie Chapman MRN: [id number] DOB/AGE: 06/09/1940 73 y.o.  Admit date: 07/20/2013 Discharge date: 07/24/2013  Admission Diagnoses:  Right colon cancer  Discharge Diagnoses:  Principal Problem:   Colon cancer-ascending colon s/p lap assisted right colectomy 07/20/13 Active Problems:   Atrial fibrillation   Morbid obesity   Anemia   Chronic kidney disease (CKD), stage I   Discharged Condition: good  Hospital Course: She was admitted and underwent laparoscopic assisted right colectomy.  She progressed well along the post-colectomy pathway.  She was on telemetry because of chronic afib which continued to be rate-controlled with metoprolol.  She had preop anemia (hemoglobin 8.5) and her hemoglobin was 7.6 on discharge without any clinical evidence of bleeding or symptoms of anemia.  She was given discharge instructions.  HHPT was arranged.  She will follow up in the office later this week for staple removal.  Consults: None  Significant Diagnostic Studies: none  Treatments: surgery: laparoscopic assisted partial colectomy  Discharge Exam: Blood pressure 99/40, pulse 92, temperature 98.5 F (36.9 C), temperature source Oral, resp. rate 18, height 5\' 3"  (1.6 m), weight 267 lb 8 oz (121.337 kg), SpO2 96.00%.   Disposition: 01-Home or Self Care  Discharge Instructions   Face-to-face encounter (required for Medicare/Medicaid patients)    Complete by:  As directed   I Niyam Bisping J certify that this patient is under my care and that I, or a nurse practitioner or physician's assistant working with me, had a face-to-face encounter that meets the physician face-to-face encounter requirements with this patient on 07/24/2013. The encounter with the patient was in whole, or in part for the following medical condition(s) which is the primary reason for home health care (List medical condition): Post-operative deconditioned state.  The encounter with the  patient was in whole, or in part, for the following medical condition, which is the primary reason for home health care:  Deconditioned state  I certify that, based on my findings, the following services are medically necessary home health services:  Physical therapy  My clinical findings support the need for the above services:  Unable to leave home safely without assistance and/or assistive device  Further, I certify that my clinical findings support that this patient is homebound due to:  Unable to leave home safely without assistance  Reason for Medically Necessary Home Health Services:  Therapy- Therapeutic Exercises to Increase Strength and Endurance     Home Health    Complete by:  As directed   To provide the following care/treatments:  PT            Medication List         acetaminophen 650 MG CR tablet  Commonly known as:  TYLENOL  Take 1,300 mg by mouth 2 (two) times daily as needed for pain.     aspirin EC 81 MG tablet  Take 81 mg by mouth daily.     atorvastatin 10 MG tablet  Commonly known as:  LIPITOR  Take 5 mg by mouth at bedtime.     enalapril 2.5 MG tablet  Commonly known as:  VASOTEC  Take 2.5 mg by mouth daily.     ferrous fumarate 325 (106 FE) MG Tabs tablet  Commonly known as:  HEMOCYTE - 106 mg FE  Take 2 tablets by mouth daily.     metoprolol succinate 25 MG 24 hr tablet  Commonly known as:  TOPROL-XL  Take 25 mg by mouth daily.  oxyCODONE 5 MG immediate release tablet  Commonly known as:  Oxy IR/ROXICODONE  Take 1 tablet (5 mg total) by mouth every 4 (four) hours as needed for moderate pain or severe pain (5 mg for moderate pain, 10 mg for severe pain).     Vitamin D3 400 UNITS tablet  Take 800 Units by mouth daily.         Signed: Odis Hollingshead 07/24/2013, 8:44 AM

## 2013-07-24 NOTE — Care Management Note (Addendum)
    Page 1 of 1   07/24/2013     11:09:08 AM CARE MANAGEMENT NOTE 07/24/2013  Patient:  Natalie Chapman,Natalie Chapman   Account Number:  [id number]  Date Initiated:  07/24/2013  Documentation initiated by:  GRAVES-BIGELOW,Emmer Lillibridge  Subjective/Objective Assessment:   Pt admitted for elective partial colectomy. Pt is from home with daughter's support. Pt states someone is always home with her.     Action/Plan:   Pt is agreeable to [facility] services. Pt chose [facility]. CM did make referral and SOC to begin within 24-48 hours post d/c.   Anticipated DC Date:  07/24/2013   Anticipated DC Plan:  [place]  CM consult      [facility] Choice  HOME HEALTH   Choice offered to / List presented to:  C-1 Patient        [place] arranged  [place].   Status of service:  Completed, signed off Medicare Important Message given?  YES (If response is "NO", the following Medicare IM given date fields will be blank) Date Medicare IM given:  07/24/2013 Medicare IM given by:  GRAVES-BIGELOW,Mariyanna Mucha Date Additional Medicare IM given:   Additional Medicare IM given by:    Discharge Disposition:  [place]  Per UR Regulation:  Reviewed for med. necessity/level of care/duration of stay  If discussed at [place] of Stay Meetings, dates discussed:    Comments:

## 2013-07-24 NOTE — Care Management (Signed)
1217 07-24-13 CM provided pt with order for Rolling walker. Pt to take order by the [place] store to pick up. No further needs from CM at this time. Bethena Roys, RN,BSN [phone]

## 2013-07-24 NOTE — Discharge Instructions (Addendum)
[facility], [place] [phone]  OPEN ABDOMINAL SURGERY: POST OP INSTRUCTIONS  Always review your discharge instruction sheet given to you by the facility where your surgery was performed.  IF YOU HAVE DISABILITY OR FAMILY LEAVE FORMS, YOU MUST BRING THEM TO THE OFFICE FOR PROCESSING.  PLEASE DO NOT GIVE THEM TO YOUR DOCTOR.  1. A prescription for pain medication may be given to you upon discharge.  Take your pain medication as prescribed, if needed.  If narcotic pain medicine is not needed, then you may take acetaminophen (Tylenol) or ibuprofen (Advil) as needed. 2. Take your usually prescribed medications unless otherwise directed. 3. If you need a refill on your pain medication, please contact your pharmacy. They will contact our office to request authorization.  Prescriptions will not be filled after 5pm or on week-ends. 4. You should resume your normal diet. 5. Most patients will experience some swelling and bruising in the area of the incision. Ice pack will help. Swelling and bruising can take several days to resolve..  6. It is common to experience some constipation if taking pain medication after surgery.  Increasing fluid intake and taking a stool softener will usually help or prevent this problem from occurring.  A mild laxative (Milk of Magnesia or Miralax) should be taken according to package directions if there are no bowel movements after 48 hours. 7.  You may have steri-strips (small skin tapes) in place directly over the incision.  These strips should be left on the skin.  If your surgeon used skin glue on the incision, you may shower in 24 hours.  The glue will flake off over the next 2-3 weeks.  Any sutures or staples will be removed at the office during your follow-up visit. You may find that a light gauze bandage over your incision may keep your staples from being rubbed or pulled. You may shower and replace the bandage daily. 8. ACTIVITIES:  You may resume  regular (light) daily activities beginning the next day--such as daily self-care, walking, climbing stairs--gradually increasing activities as tolerated.  You may have sexual intercourse when it is comfortable.  Refrain from any heavy lifting or straining for 6 week-nothing over 10 pounds.  a. You may drive when you no longer are taking prescription pain medication, you can comfortably wear a seatbelt, and you can safely maneuver your car and apply brakes b. Return to Work: ___________________________________ 17. You should see your doctor in the office for a follow-up appointment approximately two weeks after your surgery.  Make sure that you call for this appointment within a day or two after you arrive home to insure a convenient appointment time. OTHER INSTRUCTIONS:  _____________________________________________________________ _____________________________________________________________  WHEN TO CALL YOUR DOCTOR: 1. Fever over 101.0 2. Inability to urinate 3. Nausea and/or vomiting 4. Extreme swelling or bruising 5. Continued bleeding from incision. 6. Increased pain, redness, or drainage from the incision.   The clinic staff is available to answer your questions during regular business hours.  Please dont hesitate to call and ask to speak to one of the nurses if you have concerns.  For further questions, please visit [email or web address]   Cardiac Diet This diet can help prevent heart disease and stroke. Many factors influence your heart health, including eating and exercise habits. Coronary risk rises a lot with abnormal blood fat (lipid) levels. Cardiac meal planning includes limiting unhealthy fats, increasing healthy fats, and making other small dietary changes. General guidelines are as follows:  Adjust calorie intake to reach and maintain desirable body weight.  Limit total fat intake to less than 30% of total calories. Saturated fat should be less than 7% of  calories.  Saturated fats are found in animal products and in some vegetable products. Saturated vegetable fats are found in coconut oil, cocoa butter, palm oil, and palm kernel oil. Read labels carefully to avoid these products as much as possible. Use butter in moderation. Choose tub margarines and oils that have 2 grams of fat or less. Good cooking oils are canola and olive oils.  Practice low-fat cooking techniques. Do not fry food. Instead, broil, bake, boil, steam, grill, roast on a rack, stir-fry, or microwave it. Other fat reducing suggestions include:  Remove the skin from poultry.  Remove all visible fat from meats.  Skim the fat off stews, soups, and gravies before serving them.  Steam vegetables in water or broth instead of sauting them in fat.  Avoid foods with trans fat (or hydrogenated oils), such as commercially fried foods and commercially baked goods. Commercial shortening and deep-frying fats will contain trans fat.  Increase intake of fruits, vegetables, whole grains, and legumes to replace foods high in fat.  Increase consumption of nuts, legumes, and seeds to at least 4 servings weekly. One serving of a legume equals  cup, and 1 serving of nuts or seeds equals  cup.  Choose whole grains more often. Have 3 servings per day (a serving is 1 ounce [oz]).  Eat 4 to 5 servings of vegetables per day. A serving of vegetables is 1 cup of raw leafy vegetables;  cup of raw or cooked cut-up vegetables;  cup of vegetable juice.  Eat 4 to 5 servings of fruit per day. A serving of fruit is 1 medium whole fruit;  cup of dried fruit;  cup of fresh, frozen, or canned fruit;  cup of 100% fruit juice.  Increase your intake of dietary fiber to 20 to 30 grams per day. Insoluble fiber may help lower your risk of heart disease and may help curb your appetite.  Soluble fiber binds cholesterol to be removed from the blood. Foods high in soluble fiber are dried beans, citrus fruits,  oats, apples, bananas, broccoli, Brussels sprouts, and eggplant.  Try to include foods fortified with plant sterols or stanols, such as yogurt, breads, juices, or margarines. Choose several fortified foods to achieve a daily intake of 2 to 3 grams of plant sterols or stanols.  Foods with omega-3 fats can help reduce your risk of heart disease. Aim to have a 3.5 oz portion of fatty fish twice per week, such as salmon, mackerel, albacore tuna, sardines, lake trout, or herring. If you wish to take a fish oil supplement, choose one that contains 1 gram of both DHA and EPA.  Limit processed meats to 2 servings (3 oz portion) weekly.  Limit the sodium in your diet to 1500 milligrams (mg) per day. If you have high blood pressure, talk to a registered dietitian about a DASH (Dietary Approaches to Stop Hypertension) eating plan.  Limit sweets and beverages with added sugar, such as soda, to no more than 5 servings per week. One serving is:   1 tablespoon sugar.  1 tablespoon jelly or jam.   cup sorbet.  1 cup lemonade.   cup regular soda. CHOOSING FOODS Starches  Allowed: Breads: All kinds (wheat, rye, raisin, white, oatmeal, [place], [place], and English muffin bread). Low-fat rolls: English muffins, frankfurter and hamburger buns, bagels,  pita bread, tortillas (not fried). Pancakes, waffles, biscuits, and muffins made with recommended oil.  Avoid: Products made with saturated or trans fats, oils, or whole milk products. Butter rolls, cheese breads, croissants. Commercial doughnuts, muffins, sweet rolls, biscuits, waffles, pancakes, store-bought mixes. Crackers  Allowed: Low-fat crackers and snacks: Animal, graham, rye, saltine (with recommended oil, no lard), oyster, and matzo crackers. Bread sticks, melba toast, rusks, flatbread, pretzels, and light popcorn.  Avoid: High-fat crackers: cheese crackers, butter crackers, and those made with coconut, palm oil, or trans fat (hydrogenated oils).  Buttered popcorn. Cereals  Allowed: Hot or cold whole-grain cereals.  Avoid: Cereals containing coconut, hydrogenated vegetable fat, or animal fat. Potatoes / Pasta / Rice  Allowed: All kinds of potatoes, rice, and pasta (such as macaroni, spaghetti, and noodles).  Avoid: Pasta or rice prepared with cream sauce or high-fat cheese. Chow mein noodles, [place] fries. Vegetables  Allowed: All vegetables and vegetable juices.  Avoid: Fried vegetables. Vegetables in cream, butter, or high-fat cheese sauces. Limit coconut. Fruit in cream or custard. Protein  Allowed: Limit your intake of meat, seafood, and poultry to no more than 6 oz (cooked weight) per day. All lean, well-trimmed beef, veal, pork, and lamb. All chicken and [place] without skin. All fish and shellfish. Wild game: wild duck, rabbit, pheasant, and venison. Egg whites or low-cholesterol egg substitutes may be used as desired. Meatless dishes: recipes with dried beans, peas, lentils, and tofu (soybean curd). Seeds and nuts: all seeds and most nuts.  Avoid: Prime grade and other heavily marbled and fatty meats, such as short ribs, spare ribs, rib eye roast or steak, frankfurters, sausage, bacon, and high-fat luncheon meats, mutton. Caviar. Commercially fried fish. Domestic duck, goose, venison sausage. Organ meats: liver, gizzard, heart, chitterlings, brains, kidney, sweetbreads. Dairy  Allowed: Low-fat cheeses: nonfat or low-fat cottage cheese (1% or 2% fat), cheeses made with part skim milk, such as mozzarella, farmers, string, or ricotta. (Cheeses should be labeled no more than 2 to 6 grams fat per oz.). Skim (or 1%) milk: liquid, powdered, or evaporated. Buttermilk made with low-fat milk. Drinks made with skim or low-fat milk or cocoa. Chocolate milk or cocoa made with skim or low-fat (1%) milk. Nonfat or low-fat yogurt.  Avoid: Whole milk cheeses, including colby, cheddar, mu[place], Monterey Jack, [place], [place], [place], American,  Swiss, and blue. Creamed cottage cheese, cream cheese. Whole milk and whole milk products, including buttermilk or yogurt made from whole milk, drinks made from whole milk. Condensed milk, evaporated whole milk, and 2% milk. Soups and Combination Foods  Allowed: Low-fat low-sodium soups: broth, dehydrated soups, homemade broth, soups with the fat removed, homemade cream soups made with skim or low-fat milk. Low-fat spaghetti, lasagna, chili, and Spanish rice if low-fat ingredients and low-fat cooking techniques are used.  Avoid: Cream soups made with whole milk, cream, or high-fat cheese. All other soups. Desserts and Sweets  Allowed: Sherbet, fruit ices, gelatins, meringues, and angel food cake. Homemade desserts with recommended fats, oils, and milk products. Jam, jelly, honey, marmalade, sugars, and syrups. Pure sugar candy, such as gum drops, hard candy, jelly beans, marshmallows, mints, and small amounts of dark chocolate.  Avoid: Commercially prepared cakes, pies, cookies, frosting, pudding, or mixes for these products. Desserts containing whole milk products, chocolate, coconut, lard, palm oil, or palm kernel oil. Ice cream or ice cream drinks. Candy that contains chocolate, coconut, butter, hydrogenated fat, or unknown ingredients. Buttered syrups. Fats and Oils  Allowed: Vegetable oils: safflower, sunflower, corn, soybean,  cottonseed, sesame, canola, olive, or peanut. Non-hydrogenated margarines. Salad dressing or mayonnaise: homemade or commercial, made with a recommended oil. Low or nonfat salad dressing or mayonnaise.  Limit added fats and oils to 6 to 8 tsp per day (includes fats used in cooking, baking, salads, and spreads on bread). Remember to count the "hidden fats" in foods.  Avoid: Solid fats and shortenings: butter, lard, salt pork, bacon drippings. Gravy containing meat fat, shortening, or suet. Cocoa butter, coconut. Coconut oil, palm oil, palm kernel oil, or hydrogenated oils:  these ingredients are often used in bakery products, nondairy creamers, whipped toppings, candy, and commercially fried foods. Read labels carefully. Salad dressings made of unknown oils, sour cream, or cheese, such as blue cheese and Roquefort. Cream, all kinds: half-and-half, light, heavy, or whipping. Sour cream or cream cheese (even if "light" or low-fat). Nondairy cream substitutes: coffee creamers and sour cream substitutes made with palm, palm kernel, hydrogenated oils, or coconut oil. Beverages  Allowed: Coffee (regular or decaffeinated), tea. Diet carbonated beverages, mineral water. Alcohol: Check with your caregiver. Moderation is recommended.  Avoid: Whole milk, regular sodas, and juice drinks with added sugar. Condiments  Allowed: All seasonings and condiments. Cocoa powder. "Cream" sauces made with recommended ingredients.  Avoid: Carob powder made with hydrogenated fats. SAMPLE MENU Breakfast   cup orange juice   cup oatmeal  1 slice toast  1 tsp margarine  1 cup skim milk Lunch  [place] sandwich with 2 oz [place], 2 slices bread  Lettuce and tomato slices  Fresh fruit  Carrot sticks  Coffee or tea Snack  Fresh fruit or low-fat crackers Dinner  3 oz lean ground beef  1 baked potato  1 tsp margarine   cup asparagus  Lettuce salad  1 tbs non-creamy dressing   cup peach slices  1 cup skim milk Document Released: 10/08/2007 Document Revised: 06/30/2011 Document Reviewed: 03/24/2011 Exit[employer] Patient Information 2015 [place], [place]. This information is not intended to replace advice given to you by your health care provider. Make sure you discuss any questions you have with your health care provider.

## 2013-07-24 NOTE — Progress Notes (Signed)
Physical Therapy Treatment Patient Details Name: Natalie Chapman MRN: [id number] DOB: 05/01/1940 Today's Date: 07-25-13    History of Present Illness 73 yo female admitted 7/09-15 for partial colectomy for newly diagnosed adencarcinoma.    PT Comments    Pt progressing well with mobility able to ambulate today but requires increased rest after ambulation due to fatigue but maintaining sats with activity. Educated pt for activity progression and resumption of HEP. Pt stated understanding of above and limited today by hip pain and fatigue. Will continue to follow.   Follow Up Recommendations  Home health PT     Equipment Recommendations  Rolling walker with 5" wheels (short wide RW.)    Recommendations for Other Services       Precautions / Restrictions Precautions Precautions: Fall Precaution Comments: arthritic left hip    Mobility  Bed Mobility               General bed mobility comments: pt in recliner on arrival  Transfers     Transfers: Sit to/from Stand Sit to Stand: Min guard         General transfer comment: cues for safety, increased time  Ambulation/Gait Ambulation/Gait assistance: Supervision Ambulation Distance (Feet): 90 Feet Assistive device: Rolling walker (2 wheeled) Gait Pattern/deviations: Step-through pattern;Decreased stride length;Trunk flexed     General Gait Details: cues for posture and position in RW, RW too tall and discussed need for short RW   Stairs            Wheelchair Mobility    Modified Rankin (Stroke Patients Only)       Balance             Standing balance-Leahy Scale: Fair                      Cognition Arousal/Alertness: Awake/alert Behavior During Therapy: WFL for tasks assessed/performed Overall Cognitive Status: Within Functional Limits for tasks assessed                      Exercises General Exercises - Lower Extremity Short Arc Quad: AROM;Seated;Both;15 reps Hip  Flexion/Marching: AROM;Seated;Both;10 reps;15 reps (10 on LLE, 15 on RLE)    General Comments        Pertinent Vitals/Pain No pain at rest HR 80 sats 93% on RA with gait    Home Living                      Prior Function            PT Goals (current goals can now be found in the care plan section) Progress towards PT goals: Progressing toward goals    Frequency       PT Plan Discharge plan needs to be updated    Co-evaluation             End of Session   Activity Tolerance: Patient tolerated treatment well Patient left: in chair;with call bell/phone within [facility]     Time: 1132-1202 PT Time Calculation (min): 30 min  Charges:  $Gait Training: 8-22 mins $Therapeutic Activity: 8-22 mins                    G Codes:      Natalie Chapman 07-25-2013, 1:37 PM Natalie Chapman, [place]

## 2013-07-25 LAB — TYPE AND SCREEN
ABO/RH(D): A POS
Antibody Screen: NEGATIVE
Unit division: 0
Unit division: 0

## 2013-07-27 ENCOUNTER — Ambulatory Visit (INDEPENDENT_AMBULATORY_CARE_PROVIDER_SITE_OTHER): Payer: Self-pay | Admitting: Surgery

## 2013-07-27 ENCOUNTER — Encounter (INDEPENDENT_AMBULATORY_CARE_PROVIDER_SITE_OTHER): Payer: Self-pay | Admitting: Surgery

## 2013-07-27 VITALS — BP 118/80 | HR 73 | Temp 97.6°F | Ht 61.0 in | Wt 267.4 lb

## 2013-07-27 DIAGNOSIS — T792XXA Traumatic secondary and recurrent hemorrhage and seroma, initial encounter: Secondary | ICD-10-CM

## 2013-07-27 DIAGNOSIS — C189 Malignant neoplasm of colon, unspecified: Secondary | ICD-10-CM

## 2013-07-27 DIAGNOSIS — IMO0002 Reserved for concepts with insufficient information to code with codable children: Secondary | ICD-10-CM | POA: Insufficient documentation

## 2013-07-27 DIAGNOSIS — T888XXA Other specified complications of surgical and medical care, not elsewhere classified, initial encounter: Secondary | ICD-10-CM

## 2013-07-27 HISTORY — DX: Reserved for concepts with insufficient information to code with codable children: IMO0002

## 2013-07-27 NOTE — Patient Instructions (Signed)
WOUND CARE  It is important that the wound be kept open.   -Keeping the skin edges apart will allow the wound to gradually heal from the base upwards.   - If the skin edges of the wound close too early, a new fluid pocket can form and infection can occur. -This is the reason to pack deeper wounds with gauze or ribbon -This is why drained wounds cannot be sewed closed right away  A healthy wound should form a lining of bright red "beefy" granulating tissue that will help shrink the wound and help the edges grow new skin into it.   -A little mucus / yellow discharge is normal (the body's natural way to try and form a scab) and should be gently washed off with soap and water with daily dressing changes.  -Green or foul smelling drainage implies bacterial colonization and can slow wound healing - a short course of antibiotic ointment (3-5 days) can help it clear up.  Call the doctor if it does not improve or worsens  -Avoid use of antibiotic ointments for more than a week as they can slow wound healing over time.    -Sometimes other wound care products will be used to reduce need for dressing changes and/or help clean up dirty wounds -Sometimes the surgeon needs to debride the wound in the office to remove dead or infected tissue out of the wound so it can heal more quickly and safely.    Change the dressing at least once a day -Wash the wound with mild soap and water gently every day.  It is good to shower or bathe the wound to help it clean out. -Use clean 4x4 gauze (it does not need to be sterile, just clean) -Keep the raw wound moist with a little saline or KY (saline) gel on the gauze.  -A dry wound will take longer to heal.  -Keep the skin dry around the wound to prevent breakdown and irritation. -Pack the wound down to the base -The goal is to keep the skin apart, not overpack the wound -Use a Q-tip or blunt-tipped kabob stick toothpick to push the gauze down to the base in narrow or deep  wounds   -Cover with a clean gauze and tape -paper or Medipore tape tend to be gentle on the skin -rotate the orientation of the tape to avoid repeated stress/trauma on the skin -using an ACE or Coban wrap on wounds on arms or legs can be used instead.  Complete all antibiotics through the entire prescription to help the infection heal and prevent new places of infection   Returning the see the surgeon is helpful to follow the healing process and help the wound close as fast as possible.  Hematoma A hematoma is a collection of blood under the skin, in an organ, in a body space, in a joint space, or in other tissue. The blood can clot to form a lump that you can see and feel. The lump is often firm and may sometimes become sore and tender. Most hematomas get better in a few days to weeks. However, some hematomas may be serious and require medical care. Hematomas can range in size from very small to very large. CAUSES  A hematoma can be caused by a blunt or penetrating injury. It can also be caused by spontaneous leakage from a blood vessel under the skin. Spontaneous leakage from a blood vessel is more likely to occur in older people, especially those taking blood thinners.  Sometimes, a hematoma can develop after certain medical procedures. SIGNS AND SYMPTOMS   A firm lump on the body.  Possible pain and tenderness in the area.  Bruising.Blue, dark blue, purple-red, or yellowish skin may appear at the site of the hematoma if the hematoma is close to the surface of the skin. For hematomas in deeper tissues or body spaces, the signs and symptoms may be subtle. For example, an intra-abdominal hematoma may cause abdominal pain, weakness, fainting, and shortness of breath. An intracranial hematoma may cause a headache or symptoms such as weakness, trouble speaking, or a change in consciousness. DIAGNOSIS  A hematoma can usually be diagnosed based on your medical history and a physical exam. Imaging  tests may be needed if your health care provider suspects a hematoma in deeper tissues or body spaces, such as the abdomen, head, or chest. These tests may include ultrasonography or a CT scan.  TREATMENT  Hematomas usually go away on their own over time. Rarely does the blood need to be drained out of the body. Large hematomas or those that may affect vital organs will sometimes need surgical drainage or monitoring. HOME CARE INSTRUCTIONS   Apply ice to the injured area:   Put ice in a plastic bag.   Place a towel between your skin and the bag.   Leave the ice on for 20 minutes, 2-3 times a day for the first 1 to 2 days.   After the first 2 days, switch to using warm compresses on the hematoma.   Elevate the injured area to help decrease pain and swelling. Wrapping the area with an elastic bandage may also be helpful. Compression helps to reduce swelling and promotes shrinking of the hematoma. Make sure the bandage is not wrapped too tight.   If your hematoma is on a lower extremity and is painful, crutches may be helpful for a couple days.   Only take over-the-counter or prescription medicines as directed by your health care provider. SEEK IMMEDIATE MEDICAL CARE IF:   You have increasing pain, or your pain is not controlled with medicine.   You have a fever.   You have worsening swelling or discoloration.   Your skin over the hematoma breaks or starts bleeding.   Your hematoma is in your chest or abdomen and you have weakness, shortness of breath, or a change in consciousness.  Your hematoma is on your scalp (caused by a fall or injury) and you have a worsening headache or a change in alertness or consciousness. MAKE SURE YOU:   Understand these instructions.  Will watch your condition.  Will get help right away if you are not doing well or get worse. Document Released: 08/13/2003 Document Revised: 08/31/2012 Document Reviewed: 06/08/2012 [facility] Patient  Information 2015 [place], [place]. This information is not intended to replace advice given to you by your health care provider. Make sure you discuss any questions you have with your health care provider.  ABDOMINAL SURGERY: POST OP INSTRUCTIONS  1. DIET: Follow a light bland diet the first 24 hours after arrival home, such as soup, liquids, crackers, etc.  Be sure to include lots of fluids daily.  Avoid fast food or heavy meals as your are more likely to get nauseated.  Eat a low fat the next few days after surgery.   2. Take your usually prescribed home medications unless otherwise directed. 3. PAIN CONTROL: a. Pain is best controlled by a usual combination of three different methods TOGETHER: i. Ice/Heat ii.  Over the counter pain medication iii. Prescription pain medication b. Most patients will experience some swelling and bruising around the incisions.  Ice packs or heating pads (30-60 minutes up to 6 times a day) will help. Use ice for the first few days to help decrease swelling and bruising, then switch to heat to help relax tight/sore spots and speed recovery.  Some people prefer to use ice alone, heat alone, alternating between ice & heat.  Experiment to what works for you.  Swelling and bruising can take several weeks to resolve.   c. It is helpful to take an over-the-counter pain medication regularly for the first few weeks.  Choose one of the following that works best for you: i. Naproxen (Aleve, etc)  Two 220mg  tabs twice a day ii. Ibuprofen (Advil, etc) Three 200mg  tabs four times a day (every meal & bedtime) iii. Acetaminophen (Tylenol, etc) 500-650mg  four times a day (every meal & bedtime) d. A  prescription for pain medication (such as oxycodone, hydrocodone, etc) should be given to you upon discharge.  Take your pain medication as prescribed.  i. If you are having problems/concerns with the prescription medicine (does not control pain, nausea, vomiting, rash, itching, etc), please  call us [phone] to see if we need to switch you to a different pain medicine that will work better for you and/or control your side effect better. ii. If you need a refill on your pain medication, please contact your pharmacy.  They will contact our office to request authorization. Prescriptions will not be filled after 5 pm or on week-ends. 4. Avoid getting constipated.  Between the surgery and the pain medications, it is common to experience some constipation.  Increasing fluid intake and taking a fiber supplement (such as Metamucil, Citrucel, FiberCon, MiraLax, etc) 1-2 times a day regularly will usually help prevent this problem from occurring.  A mild laxative (prune juice, Milk of Magnesia, MiraLax, etc) should be taken according to package directions if there are no bowel movements after 48 hours.   5. Watch out for diarrhea.  If you have many loose bowel movements, simplify your diet to bland foods & liquids for a few days.  Stop any stool softeners and decrease your fiber supplement.  Switching to mild anti-diarrheal medications (Kayopectate, Pepto Bismol) can help.  If this worsens or does not improve, please call us. 6. Wash / shower every day.  You may shower over the incision / wound.  Avoid baths until the skin is fully healed.  Continue to shower over incision(s) after the dressing is off. 7. Remove your waterproof bandages 5 days after surgery.  You may leave the incision open to air.  You may replace a dressing/Band-Aid to cover the incision for comfort if you wish. 8. ACTIVITIES as tolerated:   a. You may resume regular (light) daily activities beginning the next day-such as daily self-care, walking, climbing stairs-gradually increasing activities as tolerated.  If you can walk 30 minutes without difficulty, it is safe to try more intense activity such as jogging, treadmill, bicycling, low-impact aerobics, swimming, etc. b. Save the most intensive and strenuous activity for last such  as sit-ups, heavy lifting, contact sports, etc  Refrain from any heavy lifting or straining until you are off narcotics for pain control.   c. DO NOT PUSH THROUGH PAIN.  Let pain be your guide: If it hurts to do something, don't do it.  Pain is your body warning you to avoid that activity for another  week until the pain goes down. d. You may drive when you are no longer taking prescription pain medication, you can comfortably wear a seatbelt, and you can safely maneuver your car and apply brakes. e. Dennis Bast may have sexual intercourse when it is comfortable.  9. FOLLOW UP in our office a. Please call [facility] at [phone] to set up an appointment to see your surgeon in the office for a follow-up appointment approximately 1-2 weeks after your surgery. b. Make sure that you call for this appointment the day you arrive home to insure a convenient appointment time. 10. IF YOU HAVE DISABILITY OR FAMILY LEAVE FORMS, BRING THEM TO THE OFFICE FOR PROCESSING.  DO NOT GIVE THEM TO YOUR DOCTOR.   WHEN TO CALL us [phone]: 1. Poor pain control 2. Reactions / problems with new medications (rash/itching, nausea, etc)  3. Fever over 101.5 F (38.5 C) 4. Inability to urinate 5. Nausea and/or vomiting 6. Worsening swelling or bruising 7. Continued bleeding from incision. 8. Increased pain, redness, or drainage from the incision  The clinic staff is available to answer your questions during regular business hours (8:30am-5pm).  Please don't hesitate to call and ask to speak to one of our nurses for clinical concerns.   A surgeon from [facility] is always on call at the hospitals   If you have a medical emergency, go to the nearest emergency room or call 911.    [facility], [address], [address] [place], [place], [place]  [address] ? MAIN: [phone] ? TOLL FREE: [phone] ? FAX [phone] [email or web address]  GETTING TO GOOD BOWEL  HEALTH. Irregular bowel habits such as constipation and diarrhea can lead to many problems over time.  Having one soft bowel movement a day is the most important way to prevent further problems.  The anorectal canal is designed to handle stretching and feces to safely manage our ability to get rid of solid waste (feces, poop, stool) out of our body.  BUT, hard constipated stools can act like ripping concrete bricks and diarrhea can be a burning fire to this very sensitive area of our body, causing inflamed hemorrhoids, anal fissures, increasing risk is perirectal abscesses, abdominal pain/bloating, an making irritable bowel worse.     The goal: ONE SOFT BOWEL MOVEMENT A DAY!  To have soft, regular bowel movements:    Drink at least 8 tall glasses of water a day.     Take plenty of fiber.  Fiber is the undigested part of plant food that passes into the colon, acting s "natures broom" to encourage bowel motility and movement.  Fiber can absorb and hold large amounts of water. This results in a larger, bulkier stool, which is soft and easier to pass. Work gradually over several weeks up to 6 servings a day of fiber (25g a day even more if needed) in the form of: o Vegetables -- Root (potatoes, carrots, turnips), leafy green (lettuce, salad greens, celery, spinach), or cooked high residue (cabbage, broccoli, etc) o Fruit -- Fresh (unpeeled skin & pulp), Dried (prunes, apricots, cherries, etc ),  or stewed ( applesauce)  o Whole grain breads, pasta, etc (whole wheat)  o Bran cereals    Bulking Agents -- This type of water-retaining fiber generally is easily obtained each day by one of the following:  o Psyllium bran -- The psyllium plant is remarkable because its ground seeds can retain so much water. This product is available as Metamucil,  Konsyl, Effersyllium, Per Diem Fiber, or the less expensive generic preparation in drug and health food stores. Although labeled a laxative, it really is not a laxative.   o Methylcellulose -- This is another fiber derived from wood which also retains water. It is available as Citrucel. o Polyethylene Glycol - and "artificial" fiber commonly called Miralax or Glycolax.  It is helpful for people with gassy or bloated feelings with regular fiber o Flax Seed - a less gassy fiber than psyllium   No reading or other relaxing activity while on the toilet. If bowel movements take longer than 5 minutes, you are too constipated   AVOID CONSTIPATION.  High fiber and water intake usually takes care of this.  Sometimes a laxative is needed to stimulate more frequent bowel movements, but    Laxatives are not a good long-term solution as it can wear the colon out. o Osmotics (Milk of Magnesia, Fleets phosphosoda, Magnesium citrate, MiraLax, GoLytely) are safer than  o Stimulants (Senokot, Castor Oil, Dulcolax, Ex Lax)    o Do not take laxatives for more than 7days in a row.    IF SEVERELY CONSTIPATED, try a Bowel Retraining Program: o Do not use laxatives.  o Eat a diet high in roughage, such as bran cereals and leafy vegetables.  o Drink six (6) ounces of prune or apricot juice each morning.  o Eat two (2) large servings of stewed fruit each day.  o Take one (1) heaping tablespoon of a psyllium-based bulking agent twice a day. Use sugar-free sweetener when possible to avoid excessive calories.  o Eat a normal breakfast.  o Set aside 15 minutes after breakfast to sit on the toilet, but do not strain to have a bowel movement.  o If you do not have a bowel movement by the third day, use an enema and repeat the above steps.    Controlling diarrhea o Switch to liquids and simpler foods for a few days to avoid stressing your intestines further. o Avoid dairy products (especially milk & ice cream) for a short time.  The intestines often can lose the ability to digest lactose when stressed. o Avoid foods that cause gassiness or bloating.  Typical foods include beans and other  legumes, cabbage, broccoli, and dairy foods.  Every person has some sensitivity to other foods, so listen to our body and avoid those foods that trigger problems for you. o Adding fiber (Citrucel, Metamucil, psyllium, Miralax) gradually can help thicken stools by absorbing excess fluid and retrain the intestines to act more normally.  Slowly increase the dose over a few weeks.  Too much fiber too soon can backfire and cause cramping & bloating. o Probiotics (such as active yogurt, Align, etc) may help repopulate the intestines and colon with normal bacteria and calm down a sensitive digestive tract.  Most studies show it to be of mild help, though, and such products can be costly. o Medicines:   Bismuth subsalicylate (ex. Kayopectate, Pepto Bismol) every 30 minutes for up to 6 doses can help control diarrhea.  Avoid if pregnant.   Loperamide (Immodium) can slow down diarrhea.  Start with two tablets (4mg  total) first and then try one tablet every 6 hours.  Avoid if you are having fevers or severe pain.  If you are not better or start feeling worse, stop all medicines and call your doctor for advice o Call your doctor if you are getting worse or not better.  Sometimes further testing (cultures, endoscopy, X-ray  studies, bloodwork, etc) may be needed to help diagnose and treat the cause of the diarrhea.  Colorectal Cancer Colorectal cancer is an abnormal growth of tissue (tumor) in the colon or rectum that is cancerous (malignant). Unlike noncancerous (benign) tumors, malignant tumors can spread to other parts of your body. The colon is the large bowel or large intestine. The rectum is the last several inches of the colon.  RISK FACTORS The exact cause of colorectal cancer is unknown. However, the following factors may increase your chances of getting colorectal cancer:   Age older than 32 years.   Abnormal growths (polyps) on the inner wall of the colon or rectum.   Diabetes.   African American  race.   Family history of hereditary nonpolyposis colorectal cancer. This condition is caused by changes in the genes that are responsible for repairing mismatched DNA.   Personal history of cancer. A person who has already had colorectal cancer may develop it a second time. Also, women with a history of ovarian, uterine, or breast cancer are at a somewhat higher risk of developing colorectal cancer.  Certain hereditary conditions.  Eating a diet that is high in fat (especially animal fat) and low in fiber, fruits, and vegetables.  Sedentary lifestyle.  Inflammatory bowel disease, including ulcerative colitis and Crohn's disease.   Smoking.   Excessive alcohol use.  SYMPTOMS Early colorectal cancer often does not cause symptoms. As the cancer grows, symptoms may include:   Changes in bowel habits.  Diarrhea.   Constipation.   Feeling like the bowel does not empty completely after a bowel movement.   Blood in the stool.   Stools that are narrower than usual.   Abdominal discomfort, pain, bloating, fullness, or cramps.  Frequent gas pain.   Unexplained weight loss.   Constant tiredness.   Nausea and vomiting.  DIAGNOSIS  Your health care provider will ask about your medical history. He or she may also perform a number of procedures, such as:   A physical exam.  A digital rectal exam.  A fecal occult blood test.  A barium enema.  Blood tests.   X-rays.   Imaging tests, such as CT scans or MRIs.   Taking a tissue sample (biopsy) from your colon or rectum to look for cancer cells.   A sigmoidoscopy to view the inside of the last part of your colon.   A colonoscopy to view the inside of your entire colon.   An endorectal ultrasound to see how deep a rectal tumor has grown and whether the cancer has spread to lymph nodes or other nearby tissues.  Your cancer will be staged to determine its severity and extent. Staging is a careful  attempt to find out the size of the tumor, whether the cancer has spread, and if so, to what parts of the body. You may need to have more tests to determine the stage of your cancer. The test results will help determine what treatment plan is best for you.   Stage 0--The cancer is found only in the innermost lining of the colon or rectum.   Stage I--The cancer has grown into the inner wall of the colon or rectum. The cancer has not yet reached the outer wall of the colon.   Stage II--The cancer extends more deeply into or through the wall of the colon or rectum. It may have invaded nearby tissue, but cancer cells have not spread to the lymph nodes.   Stage III--The cancer has  spread to nearby lymph nodes but not to other parts of the body.   Stage IV--The cancer has spread to other parts of the body, such as the liver or lungs.  Your health care provider may tell you the detailed stage of your cancer, which includes both a number and a letter.  TREATMENT  Depending on the type and stage, colorectal cancer may be treated with surgery, radiation therapy, chemotherapy, targeted therapy, or radiofrequency ablation. Some people have a combination of these therapies. Surgery may be done to remove the polyps from your colon. In early stages, your health care provider may be able to do this during a colonoscopy. In later stages, surgery may be done to remove part of your colon.  HOME CARE INSTRUCTIONS   Only take over-the-counter or prescription medicines for pain, discomfort, or fever as directed by your health care provider.   Maintain a healthy diet.   Consider joining a support group. This may help you learn to cope with the stress of having colorectal cancer.   Seek advice to help you manage treatment of side effects.   Keep all follow-up appointments as directed by your health care provider.   Inform your cancer specialist if you are admitted to the hospital.  SEEK MEDICAL CARE  IF:  Your diarrhea or constipation does not go away.   Your bowel habits change.  You have increased abdominal pain.   You notice new fatigue or weakness.  You lose weight. Document Released: 12/29/2004 Document Revised: 01/03/2013 Document Reviewed: 06/23/2012 [facility] Patient Information 2015 [place], [place]. This information is not intended to replace advice given to you by your health care provider. Make sure you discuss any questions you have with your health care provider.

## 2013-07-27 NOTE — Progress Notes (Signed)
Subjective:     Patient ID: Natalie Chapman, female   DOB: 10/02/1940, 73 y.o.   MRN: [id number]  HPI  Note: Portions of this report may have been transcribed using voice recognition software. Every effort was made to ensure accuracy; however, inadvertent computerized transcription errors may be present.   Any transcriptional errors that result from this process are unintentional.       Natalie Chapman  08-Feb-1940 [id number]  Patient Care Team: Reymundo Poll, MD as PCP - General (Family Medicine)  Procedure (Date: 07/20/2013):  Operative Note  Natalie Chapman  female  73 y.o.  07/20/2013  PREOPERATIVE DX: Right colon cancer  POSTOPERATIVE DX: Same  PROCEDURE: Laparoscopic assisted right colectomy  Surgeon: Odis Hollingshead  Assistants: Georganna Skeans, M.D.  Anesthesia: General endotracheal anesthesia  Indications: This is a 73 year old female who was anemic and had blood in her stools. She underwent a colonoscopy which demonstrated a proximal descending colon lesion was biopsied and positive for adenocarcinoma. A tattoo mark was placed at the lesion. CT scan did not demonstrate evidence of metastatic disease. CEA level is 2.8 which is within normal limits. She now presents for elective laparoscopic-assisted right colectomy. She's been seen by her cardiologist preoperatively.  Diagnosis Colon, segmental resection for tumor, Right colon and terminal ileum - INVASIVE ADENOCARCINOMA, MODERATELY DIFFERENTIATED, SPANNING 6.5 CM. - TUMOR INVADES THROUGH SEROSA. - RESECTION MARGINS ARE NEGATIVE. - PERINEURAL INVASION PRESENT. - LYMPHOVASCULAR INVASION PRESENT. - EIGHT OF FIFTEEN LYMPH NODES POSITIVE FOR METASTATIC CARCINOMA (8/15). - BENIGN APPENDIX WITH FIBROUS OBLITERATION OF THE TIP. - SEE ONCOLOGY TABLE. Microscopic Comment COLON AND RECTUM (INCLUDING TRANS-ANAL RESECTION): Specimen: Right colon, terminal ileum, and appendix. Procedure: Segmental resection of right colon. Tumor site:  Proximal ascending colon. Specimen integrity: Intact. Macroscopic tumor perforation: Absent. Invasive tumor: Maximum size: 6.5 cm. Histologic type(s): Invasive adenocarcinoma. Histologic grade and differentiation: G2, moderately differentiated. G1: well differentiated/low grade G2: moderately differentiated/low grade G3: poorly differentiated/high grade G4: undifferentiated/high grade Type of polyp in which invasive carcinoma arose: N/A. Microscopic extension of invasive tumor: Tumor invades through serosa. Lymph-Vascular invasion: Present. Peri-neural invasion: Present. Tumor deposit(s) (discontinuous extramural extension): Present. Resection margins: Negative. Proximal margin: Negative. Distal margin: Negative. 1 of 3 FINAL for LOUSIE, CALICO ([id number]) Microscopic Comment(continued) Circumferential (radial) (posterior ascending, posterior descending; lateral and posterior mid-rectum; and entire lower 1/3 rectum): Negative. Mesenteric margin (sigmoid and transverse): N/A. Distance closest margin (if all above margins negative): 1.4 cm radial margin. Treatment effect (neo-adjuvant therapy): N/A. Additional polyp(s): None Non-neoplastic findings: None Lymph nodes: number examined 15; number positive: 8 Pathologic Staging: pT4a, pN2b, MX Ancillary studies: Can be performed upon request. Natalie Males MD Pathologist, Electronic Signature (Case signed 07/24/2013) Specimen Machi Whittaker and Clinical Information Specimen(s) Obtained: Colon, segmental resection for tumor, Right colon and terminal ileum Specimen Clinical Information Colon Cancer (tl) Arnesha Schiraldi Specimen: Right colon including portion of terminal ileum and attached appendix. Specimen integrity: Intact. Specimen length: There are 6 cm of terminal ileum and right colon is 21 cm from cecum to distal margin. Mesorectal intactness: Not applicable. Tumor location: Proximal ascending colon. Tumor size: There is a 3.5 cm in length  and 6.5 cm in width tan to tan-red, firm, sessile and focally centrally ulcerated mass. Percent of bowel circumference involved: 95% Tumor distance to margins: Proximal: 6 cm Distal: 12 cm Radial (posterior ascending, posterior descending; lateral and posterior mid-rectum; and entire lower 1/3 rectum): 1.4 cm to the nonperitonealized soft tissue margin. Macroscopic extent of tumor invasion: Tumor invades through the entire thickness  of the wall, and slightly into underlying fat. Tumor does not grossly involve serosa. Total presumed lymph nodes: There are 18 possible lymph nodes ranging from 0.1 to 1.3 cm. Extramural satellite tumor nodules: None. Mucosal polyp(s): None. Additional findings: The appendix is 4.9 cm in length, ranges in diameter from 0.7 cm proximal to 0.3 cm distal, has a tan-pink smooth serosa and unremarkable cut surfaces. Block summary: A = proximal margin. B = distal margin. C - E = tumor. F = tumor and adjacent mucosa. G = tissue for molecular testing. H = appendix. I = four nodes, whole. J = four nodes, whole. K = four nodes, whole. L = four nodes, whole. 2 of 3 FINAL for EUFEMIA, PRINDLE ([id number]) Natalie Chapman(continued) M = one node bisected. N = one node bisected. Total, fourteen blocks. (SSW:ecj 07/21/2013) Report signed out from the following location(s) Technical Component performed at [facility] [address], [place], [place] [address]. CLIA #: [id number], Interpretation performed at [facility], [place], [place] [address]. CLIA #: [id number], 3 of  This patient returns for surgical re-evaluation.  She comes in with her daughter and granddaughter.  She feels like she is doing well from the colon surgery.  Eating well.  Good appetite.  No fevers or chills.  Having 3 or 4 bowel movements a day.  Getting better.  No severe diarrhea.  Still tends to lie to stay in her care.  She comes in today in a  wheelchair.  Main issue is that she noted some thinly bloody drainage from the lateral part of her right upper quadrant incision.  Moderate drainage.  They were concerned.  She was seen urgently.  Patient Active Problem List   Diagnosis Date Noted  . Preop cardiovascular exam 06/19/2013  . Colon cancer-ascending colon s/p lap assisted right colectomy 07/20/13 05/12/2013  . Atrial fibrillation 05/12/2013  . Morbid obesity 05/12/2013  . Anemia 05/12/2013  . 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 Chronic kidney disease (CKD), stage I 05/12/2013    Past Medical History  Diagnosis Date  . Hypertension   . Anemia   . Cataracts, bilateral 04-18-13    both -immmature  . Dysrhythmia 04-18-13    irregular heart beat "Atrial Fib"-Dr. Tripp,PCP follows  . Shortness of breath   . Venous stasis of both lower extremities     2010 - right worse left, but now healed  . H/O hiatal hernia   . Constipation   . High cholesterol   . Atrial fibrillation   . Arthritis     "left hip" (07/20/2013)  . Chronic kidney disease (CKD), stage I   . Colon cancer dx'd 2015    Past Surgical History  Procedure Laterality Date  . Tubal ligation    . Stomach stapling      [facility]- 80's  . Hernia repair      abdomen  . Tonsillectomy    . Colonoscopy N/A 05/02/2013    Procedure: COLONOSCOPY;  Surgeon: Jeryl Columbia, MD;  Location: [facility];  Service: Endoscopy;  Laterality: N/A;  . Cholecystectomy  1980's  . Esophagogastroduodenoscopy endoscopy    . Laparoscopic right colectomy  07/20/2013  . Incisional hernia repair    . Laparoscopic partial colectomy N/A 07/20/2013    Procedure: LAPAROSCOPIC  ASSISTED RIGHT COLECTOMY;  Surgeon: Odis Hollingshead, MD;  Location: [facility];  Service: General;  Laterality: N/A;    History   Social History  .  Marital Status: Married    Spouse Name: N/A    Number of Children: N/A  . Years of Education: N/A   Occupational History  . Not  on file.   Social History Main Topics  . Smoking status: Former Smoker -- 1.00 packs/day for 30 years    Types: Cigarettes    Quit date: 04/19/1990  . Smokeless tobacco: Never Used  . Alcohol Use: No  . Drug Use: No  . Sexual Activity: No   Other Topics Concern  . Not on file   Social History Narrative  . No narrative on file    Family History  Problem Relation Age of Onset  . Heart disease Mother   . Heart disease Father     Current Outpatient Prescriptions  Medication Sig Dispense Refill  . acetaminophen (TYLENOL) 650 MG CR tablet Take 1,300 mg by mouth 2 (two) times daily as needed for pain.       Marland Kitchen aspirin EC 81 MG tablet Take 81 mg by mouth daily.      Marland Kitchen atorvastatin (LIPITOR) 10 MG tablet Take 5 mg by mouth at bedtime.      . Cholecalciferol (VITAMIN D3) 400 UNITS tablet Take 800 Units by mouth daily.      . enalapril (VASOTEC) 2.5 MG tablet Take 2.5 mg by mouth daily.       . ferrous fumarate (HEMOCYTE - 106 MG FE) 325 (106 FE) MG TABS tablet Take 2 tablets by mouth daily.       . metoprolol succinate (TOPROL-XL) 25 MG 24 hr tablet Take 25 mg by mouth daily.       Marland Kitchen oxyCODONE (OXY IR/ROXICODONE) 5 MG immediate release tablet Take 1 tablet (5 mg total) by mouth every 4 (four) hours as needed for moderate pain or severe pain (5 mg for moderate pain, 10 mg for severe pain).  30 tablet  0   No current facility-administered medications for this visit.     No Known Allergies  BP 118/80  Pulse 73  Temp(Src) 97.6 F (36.4 C)  Ht 5\' 1"  (1.549 m)  Wt 267 lb 6.4 oz (121.292 kg)  BMI 50.55 kg/m2  Chest 2 View  07/12/2013   CLINICAL DATA:  Preop for surgery for colon carcinoma, former smoking history  EXAM: CHEST  2 VIEW  COMPARISON:  CT abdomen pelvis of 04/07/2013  FINDINGS: There coarse and prominent interstitial markings throughout the lungs. These may well be chronic in nature, but superimposed infection cannot be excluded. No effusion is seen to indicate edema. There  is moderate cardiomegaly present however. Multiple surgical clips are noted in the bowel left upper quadrant near the gastroesophageal junction. No acute bony abnormality is seen.  IMPRESSION: Prominent coarse interstitial markings most likely chronic in nature. A superimposed active inflammatory process cannot be excluded. No edema is seen.   Electronically Signed   By: Ivar Drape M.D.   On: 07/12/2013 15:37     Review of Systems  Constitutional: Negative for fever, chills, diaphoresis, appetite change and fatigue.  HENT: Negative for ear pain, sore throat and trouble swallowing.   Eyes: Negative for photophobia and visual disturbance.  Respiratory: Negative for cough and choking.   Cardiovascular: Negative for chest pain and palpitations.  Gastrointestinal: Positive for diarrhea. Negative for nausea, vomiting, abdominal pain, constipation, blood in stool, anal bleeding and rectal pain.  Genitourinary: Negative for dysuria, frequency and difficulty urinating.  Musculoskeletal: Positive for gait problem. Negative for myalgias.  Stays in a wheelchair  Skin: Negative for color change, pallor and rash.  Neurological: Negative for dizziness, speech difficulty, weakness and numbness.  Hematological: Negative for adenopathy.  Psychiatric/Behavioral: Negative for confusion and agitation. The patient is not nervous/anxious.        Objective:   Physical Exam  Constitutional: She is oriented to person, place, and time. She appears well-developed and well-nourished. No distress.  HENT:  Head: Normocephalic.  Mouth/Throat: Oropharynx is clear and moist. No oropharyngeal exudate.  Eyes: Conjunctivae and EOM are normal. Pupils are equal, round, and reactive to light. No scleral icterus.  Neck: Normal range of motion. No tracheal deviation present.  Cardiovascular: Normal rate and intact distal pulses.   Pulmonary/Chest: Effort normal. No respiratory distress. She exhibits no tenderness.   Abdominal: Soft. She exhibits no distension. There is no tenderness. Hernia confirmed negative in the right inguinal area and confirmed negative in the left inguinal area.    Incisions clean with normal healing ridges.  No hernias  Genitourinary: No vaginal discharge found.  Musculoskeletal: Normal range of motion. She exhibits no tenderness.  Lymphadenopathy:       Right: No inguinal adenopathy present.       Left: No inguinal adenopathy present.  Neurological: She is alert and oriented to person, place, and time. No cranial nerve deficit. She exhibits normal muscle tone. Coordination normal.  Skin: Skin is warm and dry. No rash noted. She is not diaphoretic.  Psychiatric: She has a normal mood and affect. Her behavior is normal.       Assessment:     Spontaneously draining postoperative hematoma/aroma.  Otherwise recovering well 2 weeks status post partial colectomy for colon cancer.     Plan:     Packed the wound daily with gauze.  Discussed with daughter.  She feels comfortable with dressing changes.  Instructions given # times  Keep appointment to see Dr. Zella Richer in 4 days for close followup.  Increase activity as tolerated to regular activity.  Low impact exercise such as walking an hour a day at least ideal.  An obvious challenge in a wheelchair-bound morbidly obese female.  Do not push through pain.  Diet as tolerated.  Low fat high fiber diet ideal.  Bowel regimen with 30 g fiber a day and fiber supplement as needed to avoid problems.  Consider fiber bowel regimen to help slow down and calm down towels.  Some postoperative diarrhea especially in the setting of a near obstructing cancer as expected to soon.  She gradually improved.  Return to clinic as needed.   Instructions discussed.  Followup with primary care physician for other health issues as would normally be done.  Consider screening for malignancies (breast, prostate, colon, melanoma, etc) as  appropriate.  Questions answered.  The patient expressed understanding and appreciation

## 2013-07-28 ENCOUNTER — Encounter (INDEPENDENT_AMBULATORY_CARE_PROVIDER_SITE_OTHER): Payer: Medicare Other

## 2013-07-30 ENCOUNTER — Encounter (INDEPENDENT_AMBULATORY_CARE_PROVIDER_SITE_OTHER): Payer: Medicare Other

## 2013-07-31 ENCOUNTER — Encounter (INDEPENDENT_AMBULATORY_CARE_PROVIDER_SITE_OTHER): Payer: Self-pay | Admitting: General Surgery

## 2013-07-31 ENCOUNTER — Other Ambulatory Visit (INDEPENDENT_AMBULATORY_CARE_PROVIDER_SITE_OTHER): Payer: Self-pay | Admitting: General Surgery

## 2013-07-31 ENCOUNTER — Ambulatory Visit (INDEPENDENT_AMBULATORY_CARE_PROVIDER_SITE_OTHER): Payer: Medicare Other | Admitting: General Surgery

## 2013-07-31 DIAGNOSIS — Z85038 Personal history of other malignant neoplasm of large intestine: Secondary | ICD-10-CM

## 2013-07-31 DIAGNOSIS — Z4889 Encounter for other specified surgical aftercare: Secondary | ICD-10-CM

## 2013-07-31 NOTE — Progress Notes (Signed)
Procedure:  Laparoscopic assisted right colectomy  Date:  07/20/2013  Pathology:  Stage III adenocarcinoma of the colon  History:  She is here for his second postoperative visit. She had spontaneous drainage of a seroma. She was seen 4 days ago. Staples were removed. The lateral aspect of the wound was left open. Her daughter is packing the wound. She is eating well. She has no pain. Bowels are moving.  Exam: General- Is in NAD. Abdomen-right upper quadrant incision demonstrates the lateral portion is open. There is some serous drainage. No purulence. Granulation tissue was forming.  Assessment:  Stage III colon cancer status post right colectomy-has a partially open wound but this is clean. There is no evidence of infection.  Plan:  Continue dressing changes. Referral to medical oncology. Return visit 2 weeks.

## 2013-07-31 NOTE — Patient Instructions (Signed)
Change bandage once or twice a day as directly. We will make a referral to medical oncology as we discussed.

## 2013-08-07 ENCOUNTER — Telehealth: Payer: Self-pay | Admitting: *Deleted

## 2013-08-07 NOTE — Telephone Encounter (Signed)
Spoke with patient re: 08/17/13 appointment with Dr. Benay Spice.  Contact names, numbers, and directions were provided.  She confirmed and was without questions.

## 2013-08-14 ENCOUNTER — Ambulatory Visit: Payer: Medicare Other | Admitting: Physician Assistant

## 2013-08-16 ENCOUNTER — Encounter (INDEPENDENT_AMBULATORY_CARE_PROVIDER_SITE_OTHER): Payer: Self-pay | Admitting: General Surgery

## 2013-08-16 ENCOUNTER — Ambulatory Visit (INDEPENDENT_AMBULATORY_CARE_PROVIDER_SITE_OTHER): Payer: Medicare Other | Admitting: General Surgery

## 2013-08-16 ENCOUNTER — Ambulatory Visit (INDEPENDENT_AMBULATORY_CARE_PROVIDER_SITE_OTHER): Payer: Medicare Other | Admitting: Physician Assistant

## 2013-08-16 ENCOUNTER — Encounter: Payer: Self-pay | Admitting: Physician Assistant

## 2013-08-16 VITALS — BP 132/74 | HR 87 | Temp 98.0°F | Resp 18

## 2013-08-16 VITALS — BP 110/64 | HR 89 | Ht 61.0 in | Wt 264.0 lb

## 2013-08-16 DIAGNOSIS — I4891 Unspecified atrial fibrillation: Secondary | ICD-10-CM

## 2013-08-16 DIAGNOSIS — Z4889 Encounter for other specified surgical aftercare: Secondary | ICD-10-CM

## 2013-08-16 NOTE — Patient Instructions (Signed)
Sharyn Lull will contact you once she talks with Dr Johnsie Cancel.

## 2013-08-16 NOTE — Progress Notes (Signed)
Procedure:  Laparoscopic assisted right colectomy  Date:  07/20/2013  Pathology:  Stage III adenocarcinoma of the colon  History:  She is here for another postoperative visit.  The lateral aspect of her wound is still opening and there doing dressing changes daily. The field the wound is getting smaller. Some days she has a better appetite than others. Bowels are moving. She is due to see Dr. Benay Spice tomorrow. Exam: General- Is in NAD. Abdomen-right upper quadrant incision demonstrates the lateral part of the wound is open with granulation tissue and some light green drainage. No cellulitis or purulent drainage. Assessment:  Stage III colon cancer status post right colectomy-lateral aspect of the wound is healing in slowly by secondary intention. She is making slow, steady postoperative progress.  Plan:  Increase protein in diet. Increase dressing changes to twice a day. Add vitamin A, vitamin C, and zinc supplements. Return visit 3 weeks.

## 2013-08-16 NOTE — Assessment & Plan Note (Addendum)
Patient has chronic atrial fibrillation, rate controlled on metoprolol. She had a GI bleed after being on Xarelto for 5 weeks in the setting of colonCancer. She has since had surgery, Her hemoglobin was 7.6 on 07/24/13. Discussed restarting Xarelto with Dr. Johnsie Cancel who recommends holding off for now until her hemoglobin comes up. He will see her back in followup in one to 2 months. I called patient and gave her these instructions myself.

## 2013-08-16 NOTE — Patient Instructions (Signed)
Increase dressing changes to twice a day. Increase protein in diet.  Take Vitamin A, C, and Zinc supplements.

## 2013-08-16 NOTE — Assessment & Plan Note (Signed)
Weight loss recommended 

## 2013-08-16 NOTE — Progress Notes (Signed)
HPI: This is a 73 year old female patient of Dr.Nishan with history of atrial fibrillation who developed a GI bleed on Xarelto in the setting of stage III colon cancer for which he had surgery 07/20/13. 2-D echo on 06/20/13 showed normal LV function EF 55-60% with mild MR and severely dilated left atrium, moderate TR. Plan was to restart Xarelto after surgery.  Patient feels much better since surgery. She denies any chest pain palpitations, dyspnea, dyspnea on exertion, dizziness, or presyncope. She sees the oncologist tomorrow to decide on further treatment of her cancer as she had 8/15 nodes positive. Her last hemoglobin was 7.9.   No Known Allergies  Current Outpatient Prescriptions on File Prior to Visit: acetaminophen (TYLENOL) 650 MG CR tablet, Take 1,300 mg by mouth 2 (two) times daily as needed for pain. , Disp: , Rfl:  aspirin EC 81 MG tablet, Take 81 mg by mouth daily., Disp: , Rfl:  atorvastatin (LIPITOR) 10 MG tablet, Take 5 mg by mouth at bedtime., Disp: , Rfl:  Cholecalciferol (VITAMIN D3) 400 UNITS tablet, Take 800 Units by mouth daily., Disp: , Rfl:  enalapril (VASOTEC) 2.5 MG tablet, Take 2.5 mg by mouth daily. , Disp: , Rfl:  ferrous fumarate (HEMOCYTE - 106 MG FE) 325 (106 FE) MG TABS tablet, Take 2 tablets by mouth daily. , Disp: , Rfl:  metoprolol succinate (TOPROL-XL) 25 MG 24 hr tablet, Take 25 mg by mouth daily. , Disp: , Rfl:   No current facility-administered medications on file prior to visit.   Past Medical History:   Hypertension                                                 Anemia                                                       Cataracts, bilateral                            04-18-13         Comment:both -immmature   Dysrhythmia                                     04-18-13         Comment:irregular heart beat "Atrial Fib"-Dr. Tripp,PCP              follows   Shortness of breath                                          Venous stasis of both lower extremities                         Comment:2010 - right worse left, but now healed   H/O hiatal hernia  Constipation                                                 High cholesterol                                             Atrial fibrillation                                          Arthritis                                                      Comment:"left hip" (07/20/2013)   Chronic kidney disease (CKD), stage I                        Colon cancer                                    dx'd 2015   Past Surgical History:   TUBAL LIGATION                                                stomach stapling                                                Comment:[facility]- [facility]'s   HERNIA REPAIR                                                   Comment:abdomen   TONSILLECTOMY                                                 COLONOSCOPY                                     N/A 05/02/2013      Comment:Procedure: COLONOSCOPY;  Surgeon: Jeryl Columbia,              MD;  Location: [facility];  Service:               Endoscopy;  Laterality: N/A;   CHOLECYSTECTOMY  1980's       ESOPHAGOGASTRODUODENOSCOPY ENDOSCOPY                          LAPAROSCOPIC RIGHT COLECTOMY                     07/20/2013     INCISIONAL HERNIA REPAIR                                      LAPAROSCOPIC PARTIAL COLECTOMY                  N/A 07/20/2013       Comment:Procedure: LAPAROSCOPIC  ASSISTED RIGHT               COLECTOMY;  Surgeon: Odis Hollingshead, MD;                Location: [facility];  Service: General;                Laterality: N/A;  Review of patient's family history indicates:   Heart disease                  Mother                   Heart disease                  Father                   Social History   Marital Status: Married             Spouse Name:                      Years of Education:                 Number of children:             Occupational  History   None on file  Social History Main Topics   Smoking Status: Former Smoker                   Packs/Day: 1.00  Years: 30        Types: Cigarettes     Quit date: 04/19/1990   Smokeless Status: Never Used                       Alcohol Use: No             Drug Use: No             Sexual Activity: No                 Other Topics            Concern   None on file  Social History Narrative   None on file    ROS: See history of present illness otherwise negative   PHYSICAL EXAM: Obese, in no acute distress. Neck: No JVD, HJR, Bruit, or thyroid enlargement  Lungs: No tachypnea, clear without wheezing, rales, or rhonchi  Cardiovascular: Irregular irregular, PMI not displaced, heart sounds distant, no murmurs, gallops, bruit, thrill, or heave.  Abdomen: BS normal. Soft without organomegaly, masses, lesions or tenderness.  Extremities: Chronic lymphedema otherwise lower extremities without cyanosis, clubbing . Good distal pulses bilateral  SKin: Warm, no lesions or rashes   Musculoskeletal: No deformities  Neuro: no focal signs  BP 110/64  Pulse 89  Ht 5\' 1"  (1.549 m)  Wt 264 lb (119.75 kg)  BMI 49.91 kg/m2     EKG: Atrial fibrillation at 84 beats per minute nonspecific ST-T wave changes, no acute change   2-D echo 06/20/13 Study Conclusions  - Left ventricle: The cavity size was normal. Wall thickness was   normal. Systolic function was normal. The estimated ejection   fraction was in the range of 55% to 60%. - Aortic valve: Possible lamble&'s excresense. - Mitral valve: There was mild regurgitation. - Left atrium: The atrium was severely dilated. - Atrial septum: No defect or patent foramen ovale was identified. - Tricuspid valve: There was moderate regurgitation.

## 2013-08-17 ENCOUNTER — Telehealth: Payer: Self-pay | Admitting: Oncology

## 2013-08-17 ENCOUNTER — Encounter: Payer: Self-pay | Admitting: Oncology

## 2013-08-17 ENCOUNTER — Ambulatory Visit: Payer: Medicare Other

## 2013-08-17 ENCOUNTER — Ambulatory Visit (HOSPITAL_BASED_OUTPATIENT_CLINIC_OR_DEPARTMENT_OTHER): Payer: Medicare Other | Admitting: Oncology

## 2013-08-17 ENCOUNTER — Telehealth: Payer: Self-pay | Admitting: *Deleted

## 2013-08-17 VITALS — BP 119/79 | HR 84 | Temp 97.7°F | Resp 19 | Ht 61.0 in | Wt 245.2 lb

## 2013-08-17 DIAGNOSIS — C182 Malignant neoplasm of ascending colon: Secondary | ICD-10-CM

## 2013-08-17 DIAGNOSIS — J449 Chronic obstructive pulmonary disease, unspecified: Secondary | ICD-10-CM

## 2013-08-17 DIAGNOSIS — Z87891 Personal history of nicotine dependence: Secondary | ICD-10-CM

## 2013-08-17 DIAGNOSIS — I4891 Unspecified atrial fibrillation: Secondary | ICD-10-CM

## 2013-08-17 DIAGNOSIS — N289 Disorder of kidney and ureter, unspecified: Secondary | ICD-10-CM

## 2013-08-17 DIAGNOSIS — C189 Malignant neoplasm of colon, unspecified: Secondary | ICD-10-CM

## 2013-08-17 DIAGNOSIS — D500 Iron deficiency anemia secondary to blood loss (chronic): Secondary | ICD-10-CM

## 2013-08-17 NOTE — Progress Notes (Signed)
Met with Natalie Chapman and family. Explained role of nurse navigator. Educational information provided on colorectal cancer  Referral made to dietician for diet education. [facility] resources provided to patient, including SW service and support group information.  Contact names and phone numbers were provided for entire [facility] team.  Teach back method was used.  Staging work-up and treatment plan still TBD.   Will continue to follow as needed. 

## 2013-08-17 NOTE — Telephone Encounter (Signed)
Spoke with Natalie Chapman in pathology re: order from Dr. Benay Spice to perform MSI/IHC testing on Accession: [id number].

## 2013-08-17 NOTE — Telephone Encounter (Signed)
gv and printed appt scehda dna vs for pt for Aug....[place]ale to add est

## 2013-08-17 NOTE — Progress Notes (Signed)
[facility] Patient Consult   Referring MD: Jackolyn Confer  Natalie Chapman 73 y.o.  1940-08-02    Reason for Referral: Colon cancer   HPI: She reports developing rectal bleeding in December of 2014 while on xarelto anticoagulation. She was anemic. She had persistent anemia despite stopping the xarelto, she reports "cramping" abdominal pain. A CT of the abdomen and pelvis on 04/07/2013 revealed emphysematous changes at the lung bases. The liver appeared unremarkable. A focal area of circumferential wall thickening was noted at the cecum. No adenopathy  She was referred to Dr. Watt Climes and was taken to a colonoscopy 05/02/2013. A partially obstructing mass was seen in the ascending colon. The mass was biopsied and tattooed. An upper endoscopy on 03/31/2013 revealed evidence of a band gastroplasty in the cardia with friable mucosa.  She was referred to Dr. Zella Richer and was taken to a laparoscopic assisted right colectomy on 07/20/2013. A right colectomy was performed with a side-to-side anastomosis between the distal ileum and transverse colon.  The pathology [id number]) confirmed an invasive moderately differentiated adenocarcinoma at the proximal acsending colon. Tumor invaded through the serosa. Lymphovascular and perineural invasion were present. The resection margins were negative. 8 of 15 lymph nodes contained metastatic carcinoma. Tumor deposits were present.  The abdominal wound continues to heal. Her daughter packs the wound daily.       Past Medical History  Diagnosis Date  . Hypertension   . Anemia   . Cataracts, bilateral 04-18-13    both -immmature  . Dysrhythmia 04-18-13    irregular heart beat "Atrial Fib"-Dr. Tripp,PCP follows  .  G3 P2, one miscarriage    . Venous stasis of both lower extremities     2010 - right worse left, but now healed  . H/O hiatal hernia   .    Marland Kitchen High cholesterol   . Atrial fibrillation   . Arthritis     "left hip"  (07/20/2013)  . Chronic kidney disease (CKD), stage I   . Colon cancer-ascending colon (T4, N2)   07/20/2013     Past Surgical History  Procedure Laterality Date  . Tubal ligation    . Stomach stapling      [facility]- 80's  . Hernia repair      abdomen  . Tonsillectomy    . Colonoscopy N/A 05/02/2013    Procedure: COLONOSCOPY;  Surgeon: Jeryl Columbia, MD;  Location: [facility];  Service: Endoscopy;  Laterality: N/A;  . Cholecystectomy  1980's  . Esophagogastroduodenoscopy endoscopy   03/31/2013  . Laparoscopic right colectomy  07/20/2013  . Incisional hernia repair    . Laparoscopic partial colectomy N/A 07/20/2013    Procedure: LAPAROSCOPIC  ASSISTED RIGHT COLECTOMY;  Surgeon: Odis Hollingshead, MD;  Location: [facility];  Service: General;  Laterality: N/A;    Medications: Reviewed  Allergies: No Known Allergies  Family history: She has 4 sisters and one brother. Her brother has prostate cancer. A sister had colonic polyps. No other family history of cancer.  Social History:   She lives with her daughter in [place]. She previously worked as a Designer, jewellery. She quit smoking cigarettes in 1991. She does not use alcohol. No transfusion history. No risk factor for HIV or hepatitis.   ROS:   Positives include: 200 pound weight loss since 2014, anorexia, chronic skin thickening at the lower legs  A complete ROS was otherwise negative.  Physical Exam:  Blood pressure 119/79, pulse 84, temperature 97.7 F (  36.5 C), temperature source Oral, resp. rate 19, height 5' 1"  (1.549 m), weight 245 lb 3.2 oz (111.222 kg), SpO2 99.00%. She was examined in the wheelchair HEENT: Upper and lower denture plate, oropharynx without visible mass, neck without mass Lungs: Clear bilaterally Cardiac: Irregular Abdomen: The right lateral portion of the surgical incision has a superficial opening with a packing in place, no hepatomegaly, nontender, no mass  Vascular: Chronic stasis  change at the low leg bilaterally with trace pitting edema Lymph nodes: No cervical, supraclavicular, axillary, or inguinal nodes Neurologic: Alert and oriented, the motor exam appears grossly intact Skin: No rash Musculoskeletal: No spine tenderness   LAB:  CBC  Lab Results  Component Value Date   WBC 4.5 07/24/2013   HGB 7.6* 07/24/2013   HCT 23.8* 07/24/2013   MCV 90.5 07/24/2013   PLT 197 07/24/2013   NEUTROABS 4.0 07/12/2013     CMP      Component Value Date/Time   NA 138 07/21/2013 0505   K 4.3 07/21/2013 0505   CL 105 07/21/2013 0505   CO2 19 07/21/2013 0505   GLUCOSE 161* 07/21/2013 0505   BUN 14 07/21/2013 0505   CREATININE 1.20* 07/21/2013 0505   CALCIUM 8.6 07/21/2013 0505   PROT 8.1 07/12/2013 1511   ALBUMIN 3.7 07/12/2013 1511   AST 9 07/12/2013 1511   ALT <5 07/12/2013 1511   ALKPHOS 56 07/12/2013 1511   BILITOT 0.7 07/12/2013 1511   GFRNONAA 44* 07/21/2013 0505   GFRAA 51* 07/21/2013 0505    Lab Results  Component Value Date   CEA 2.8 05/02/2013    Imaging:  As per history of present illness, CT images from 04/07/2013 reviewed   Assessment/Plan:   1. 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 8 of 15 lymph nodes contained metastatic carcinoma, tumor deposits were present   2. Atrial fibrillation  3.   anemia secondary to bleeding from the colon cancer versus renal insufficiency  4.   history of tobacco use with COPD changes on the CT 04/07/2013  5.  morbid obesity  6.  status post gastric banding surgery  7.  renal insufficiency   Disposition:   Natalie Chapman has been diagnosed with colon cancer. I discussed the details of the surgical pathology report with Natalie Chapman and her family. We discussed the prognosis and treatment options. She has a high chance of developing recurrent colon cancer over the next several years. We discussed the benefit associated with  adjuvant 5-fluorouracil therapy in this setting. I think it would be difficult for her to tolerate oxaliplatin with her multiple comorbid conditions.  I will recommend 5-fluorouracil or capecitabine if she is confirmed to have stage III disease. We reviewed the potential toxicities associated with capecitabine including the chance for nausea, mucositis, diarrhea, and hematologic toxicity. We discussed the rash, hyperpigmentation, and hand-foot syndrome associated with capecitabine.  There is a significant chance of having metastatic disease with the T4 lesion and multiple lymph node metastases. The original staging CT was in March. We decided to proceed with a staging PET scan within the next one week.  The tumor will be submitted for mismatch repair protein and microsatellite instability testing. Her family understands the increased risk of colon cancer and will have appropriate screening.  Natalie Chapman will return for an office visit and further discussion 08/28/2013. We will check a chemistry panel and CBC when she returns for the PET  scan.  Betsy Coder 08/17/2013, 6:26 PM

## 2013-08-17 NOTE — Progress Notes (Signed)
Checked in new pt with no financial concerns. °

## 2013-08-18 ENCOUNTER — Other Ambulatory Visit (HOSPITAL_COMMUNITY)
Admission: RE | Admit: 2013-08-18 | Discharge: 2013-08-18 | Disposition: A | Discharge status: Home / Self Care | Payer: Medicare Other | Source: Ambulatory Visit | Attending: Oncology | Admitting: Oncology

## 2013-08-18 DIAGNOSIS — C182 Malignant neoplasm of ascending colon: Secondary | ICD-10-CM | POA: Diagnosis present

## 2013-08-21 ENCOUNTER — Telehealth: Payer: Self-pay | Admitting: *Deleted

## 2013-08-21 NOTE — Telephone Encounter (Signed)
Called to ask about status of PET scan that MD ordered on 08/17/13?

## 2013-08-23 ENCOUNTER — Other Ambulatory Visit (HOSPITAL_BASED_OUTPATIENT_CLINIC_OR_DEPARTMENT_OTHER): Payer: Medicare Other

## 2013-08-23 DIAGNOSIS — C189 Malignant neoplasm of colon, unspecified: Secondary | ICD-10-CM

## 2013-08-23 DIAGNOSIS — C182 Malignant neoplasm of ascending colon: Secondary | ICD-10-CM

## 2013-08-23 LAB — COMPREHENSIVE METABOLIC PANEL (CC13)
ALK PHOS: 64 U/L (ref 40–150)
ALT: <6 U/L (ref 0–55)
ANION GAP: 11 meq/L (ref 3–11)
AST: 11 U/L (ref 5–34)
Albumin: 3.1 g/dL — ABNORMAL LOW (ref 3.5–5.0)
BUN: 19 mg/dL (ref 7.0–26.0)
CO2: 23 meq/L (ref 22–29)
CREATININE: 1.4 mg/dL — AB (ref 0.6–1.1)
Calcium: 9.5 mg/dL (ref 8.4–10.4)
Chloride: 103 mEq/L (ref 98–109)
GLUCOSE: 102 mg/dL (ref 70–140)
Potassium: 4.9 mEq/L (ref 3.5–5.1)
Sodium: 137 mEq/L (ref 136–145)
TOTAL PROTEIN: 7.6 g/dL (ref 6.4–8.3)
Total Bilirubin: 0.59 mg/dL (ref 0.20–1.20)

## 2013-08-23 LAB — CBC WITH DIFFERENTIAL/PLATELET
BASO%: 1.1 % (ref 0.0–2.0)
BASOS ABS: 0.1 10*3/uL (ref 0.0–0.1)
EOS ABS: 0.2 10*3/uL (ref 0.0–0.5)
EOS%: 3.5 % (ref 0.0–7.0)
HCT: 28 % — ABNORMAL LOW (ref 34.8–46.6)
HEMOGLOBIN: 8.9 g/dL — AB (ref 11.6–15.9)
LYMPH%: 18 % (ref 14.0–49.7)
MCH: 27.3 pg (ref 25.1–34.0)
MCHC: 31.6 g/dL (ref 31.5–36.0)
MCV: 86.3 fL (ref 79.5–101.0)
MONO#: 0.4 10*3/uL (ref 0.1–0.9)
MONO%: 6.5 % (ref 0.0–14.0)
NEUT#: 4.6 10*3/uL (ref 1.5–6.5)
NEUT%: 70.9 % (ref 38.4–76.8)
PLATELETS: 342 10*3/uL (ref 145–400)
RBC: 3.24 10*6/uL — AB (ref 3.70–5.45)
RDW: 15.9 % — ABNORMAL HIGH (ref 11.2–14.5)
WBC: 6.5 10*3/uL (ref 3.9–10.3)
lymph#: 1.2 10*3/uL (ref 0.9–3.3)

## 2013-08-24 ENCOUNTER — Encounter (HOSPITAL_COMMUNITY): Payer: Self-pay

## 2013-08-24 ENCOUNTER — Ambulatory Visit (HOSPITAL_COMMUNITY)
Admission: RE | Admit: 2013-08-24 | Discharge: 2013-08-24 | Disposition: A | Discharge status: Home / Self Care | Payer: Medicare Other | Source: Ambulatory Visit | Attending: Diagnostic Radiology | Admitting: Diagnostic Radiology

## 2013-08-24 DIAGNOSIS — I7789 Other specified disorders of arteries and arterioles: Secondary | ICD-10-CM | POA: Insufficient documentation

## 2013-08-24 DIAGNOSIS — Z9049 Acquired absence of other specified parts of digestive tract: Secondary | ICD-10-CM | POA: Insufficient documentation

## 2013-08-24 DIAGNOSIS — C189 Malignant neoplasm of colon, unspecified: Secondary | ICD-10-CM | POA: Diagnosis present

## 2013-08-24 DIAGNOSIS — R599 Enlarged lymph nodes, unspecified: Secondary | ICD-10-CM | POA: Insufficient documentation

## 2013-08-24 DIAGNOSIS — M7989 Other specified soft tissue disorders: Secondary | ICD-10-CM | POA: Insufficient documentation

## 2013-08-24 LAB — GLUCOSE, CAPILLARY: GLUCOSE-CAPILLARY: 108 mg/dL — AB (ref 70–99)

## 2013-08-24 MED ORDER — FLUDEOXYGLUCOSE F - 18 (FDG) INJECTION: 12.2300 | Freq: Once | INTRAVENOUS | Status: AC | PRN

## 2013-08-28 ENCOUNTER — Encounter (HOSPITAL_COMMUNITY): Payer: Self-pay

## 2013-08-28 ENCOUNTER — Telehealth: Payer: Self-pay | Admitting: Oncology

## 2013-08-28 ENCOUNTER — Ambulatory Visit (HOSPITAL_BASED_OUTPATIENT_CLINIC_OR_DEPARTMENT_OTHER): Payer: Medicare Other | Admitting: Oncology

## 2013-08-28 VITALS — BP 140/55 | HR 109 | Temp 97.6°F | Resp 17 | Ht 61.0 in | Wt 232.2 lb

## 2013-08-28 DIAGNOSIS — N289 Disorder of kidney and ureter, unspecified: Secondary | ICD-10-CM

## 2013-08-28 DIAGNOSIS — C182 Malignant neoplasm of ascending colon: Secondary | ICD-10-CM

## 2013-08-28 DIAGNOSIS — D649 Anemia, unspecified: Secondary | ICD-10-CM

## 2013-08-28 DIAGNOSIS — I4891 Unspecified atrial fibrillation: Secondary | ICD-10-CM

## 2013-08-28 DIAGNOSIS — C189 Malignant neoplasm of colon, unspecified: Secondary | ICD-10-CM

## 2013-08-28 NOTE — Progress Notes (Signed)
  [place] OFFICE PROGRESS NOTE   Diagnosis: Colon cancer  INTERVAL HISTORY:   Ms. Natalie Chapman returns as scheduled. The abdominal wound is healing. No new complaint. She continues to have anorexia. This has been present since prior to surgery.  Objective:  Vital signs in last 24 hours:  Blood pressure 140/55, pulse 109, temperature 97.6 F (36.4 C), temperature source Oral, resp. rate 17, height 5\' 1"  (1.549 m), weight 232 lb 3.2 oz (105.325 kg), SpO2 98.00%.    Lymphatics: No inguinal nodes Resp: Lungs clear bilaterally Cardio: Irregular GI: Nontender, no mass, healing surgical incision at the right lower abdomen with a 1-2 cm superficial opening. No evidence of infection Vascular: Chronic stasis change at the lower leg bilaterally   Lab Results:  Lab Results  Component Value Date   WBC 6.5 08/23/2013   HGB 8.9* 08/23/2013   HCT 28.0* 08/23/2013   MCV 86.3 08/23/2013   PLT 342 08/23/2013   NEUTROABS 4.6 08/23/2013    Lab Results  Component Value Date   CEA 2.8 05/02/2013    Imaging:  PET scan 44/[id number] and hypermetabolic 9 mm left axillary node, hypermetabolism with ill-defined cutaneous thickening in the right lower quadrant, calcified and hypermetabolic inguinal nodes Medications: I have reviewed the patient's current medications.  Assessment/Plan: 1. 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 8 of 15 lymph nodes contained metastatic carcinoma, tumor deposits were present  PET scan 08/24/2013 with mildly hypermetabolic small left axillary and right inguinal nodes--most likely unrelated to colon cancer, no clear evidence of metastatic disease 2. Atrial fibrillation 3. anemia secondary to bleeding from the colon cancer versus renal insufficiency  4. history of tobacco use with COPD changes on the CT 04/07/2013  5. morbid obesity  6. status post gastric  banding surgery  7. renal insufficiency  8. anorexia-etiology unclear   Disposition:  Natalie Chapman appears stable. The abdominal wound has almost completely healed. I reviewed the PET images with Natalie Chapman and her family. The hypermetabolism at the abdominal wall and surgical anastomosis site are likely related to post operative changes. The mildly hypermetabolic small lymph nodes most likely do not represent metastatic colon cancer, but this is possible.  Natalie Chapman understands the high chance of developing recurrent colon cancer. I recommend adjuvant capecitabine. We reviewed potential toxicities associated with capecitabine including the chance for nausea, mucositis, alopecia, diarrhea, and hematologic toxicity. We discussed the hyperpigmentation, rash, and hand/foot syndrome associated with capecitabine. She agrees to proceed. Natalie Chapman will attend a chemotherapy teaching class.  The plan is to begin a first cycle of capecitabine on 09/05/2013. She will see the cancer Center nutritionist. The etiology of the anorexia is unclear. I am concerned this could be related to progressive colon cancer, but there is no clinical or PET evidence of metastatic disease. We will monitor her weight closely and further evaluate the anorexia if the weight loss persists.  Natalie Chapman will return for an office visit 09/21/2013.  Betsy Coder, MD  08/28/2013  3:44 PM

## 2013-08-28 NOTE — Telephone Encounter (Signed)
Pt confirmed labs/ov/nut/chemo class per 08/17 POF, gave pt AVS..Marland KitchenKJ

## 2013-08-28 NOTE — Progress Notes (Signed)
Met with patient and family.  Dr. Benay Spice is planning to treat with Xeloda.  Patient and family will attend a chemo teaching class.  Patient to see surgeon prior to starting treatment for clearance from surgical standpoint.  Referral made to dietician for weight loss.  Patient and family deny any further needs at this time.

## 2013-08-29 ENCOUNTER — Other Ambulatory Visit: Payer: Medicare Other

## 2013-08-29 ENCOUNTER — Encounter: Payer: Self-pay | Admitting: Oncology

## 2013-08-29 ENCOUNTER — Encounter: Payer: Self-pay | Admitting: *Deleted

## 2013-08-29 ENCOUNTER — Other Ambulatory Visit: Payer: Self-pay | Admitting: *Deleted

## 2013-08-29 ENCOUNTER — Ambulatory Visit: Payer: Medicare Other | Admitting: Nutrition

## 2013-08-29 MED ORDER — CAPECITABINE 500 MG PO TABS: ORAL_TABLET | ORAL | Status: DC

## 2013-08-29 NOTE — Progress Notes (Signed)
Faxed xeloda prescription to [facility]. °

## 2013-08-29 NOTE — Progress Notes (Signed)
73 year old female diagnosed with colon cancer. She is a patient of Dr. Benay Spice.  Past medical history includes tobacco, COPD, morbid obesity, gastric surgery in 1980, and renal insufficiency.  Medications include Lipitor, Xeloda, vitamin D3, vitamin A, vitamin C, Zinc.  Labs include creatinine 1.4 and albumin 3.1.  Height: 61 inches. Weight: 232.2 pounds August 17. Usual body weight: 280 pounds April 2015.  Patient reports a weight of 400 pounds 2 years ago. BMI: 43.9.  Patient is status post abdominal surgery for colon cancer.  Abdominal wound is healing.  Patient reports anorexia and taste alterations.  Patient admits to very little by mouth intake.  States she has not had anything to eat today.  Presents with son today in wheelchair.  Patient meets criteria for severe malnutrition in the context of acute illness secondary to greater than 7.5% weight loss in 3 months and less than 50% of energy intake for greater than 5 days.  Nutrition diagnosis: Unintended weight loss related to inadequate oral intake as evidenced by 17% weight loss in 4 months.  Intervention:  Patient educated to consume smaller, more frequent meals and snacks.  Recommended high-calorie, high-protein foods. Encouraged patient to consider adding oral nutrition supplements such as ensure, boost, Carnation breakfast essentials, and protein drinks.  Provided samples. Reviewed importance of weight maintenance during treatment. Questions answered and teach back method used.  Monitoring, evaluation, goals: Patient will increase calories and protein to support weight maintenance.  Next visit: Patient prefers to contact me by telephone for followup.  If needed.  **Disclaimer: This note was dictated with voice recognition software. Similar sounding words can inadvertently be transcribed and this note may contain transcription errors which may not have been corrected upon publication of note.**

## 2013-09-04 ENCOUNTER — Encounter (INDEPENDENT_AMBULATORY_CARE_PROVIDER_SITE_OTHER): Payer: Self-pay | Admitting: General Surgery

## 2013-09-04 ENCOUNTER — Ambulatory Visit (INDEPENDENT_AMBULATORY_CARE_PROVIDER_SITE_OTHER): Payer: Medicare Other | Admitting: General Surgery

## 2013-09-04 VITALS — BP 124/82 | HR 72 | Temp 98.0°F | Ht 61.0 in | Wt 241.0 lb

## 2013-09-04 DIAGNOSIS — Z4889 Encounter for other specified surgical aftercare: Secondary | ICD-10-CM

## 2013-09-04 NOTE — Progress Notes (Signed)
Procedure:  Laparoscopic assisted right colectomy  Date:  07/20/2013  Pathology:  Stage III adenocarcinoma of the colon  History:  She is here for another postoperative visit.  Her wound is almost completely healed.  She is starting Xeloda tomorrow.  She is eating well. Exam: General- Is in NAD. Abdomen-right upper quadrant incision demonstrates the lateral part of the wound is almost completely healed. Assessment:  Stage III colon cancer status post right colectomy-would almost completely healed.  Plan:  Return visit 3 months.

## 2013-09-04 NOTE — Patient Instructions (Signed)
Continue current wound care.  Call for wound problems.

## 2013-09-21 ENCOUNTER — Telehealth: Payer: Self-pay | Admitting: Nurse Practitioner

## 2013-09-21 ENCOUNTER — Other Ambulatory Visit (HOSPITAL_BASED_OUTPATIENT_CLINIC_OR_DEPARTMENT_OTHER): Payer: Medicare Other

## 2013-09-21 ENCOUNTER — Ambulatory Visit (HOSPITAL_BASED_OUTPATIENT_CLINIC_OR_DEPARTMENT_OTHER): Payer: Medicare Other | Admitting: Nurse Practitioner

## 2013-09-21 VITALS — BP 112/58 | HR 80 | Temp 97.8°F | Resp 18 | Ht 61.0 in | Wt 237.8 lb

## 2013-09-21 DIAGNOSIS — I4891 Unspecified atrial fibrillation: Secondary | ICD-10-CM

## 2013-09-21 DIAGNOSIS — C182 Malignant neoplasm of ascending colon: Secondary | ICD-10-CM

## 2013-09-21 DIAGNOSIS — C189 Malignant neoplasm of colon, unspecified: Secondary | ICD-10-CM

## 2013-09-21 LAB — COMPREHENSIVE METABOLIC PANEL (CC13)
ALBUMIN: 3.4 g/dL — AB (ref 3.5–5.0)
ALT: 7 U/L (ref 0–55)
ANION GAP: 9 meq/L (ref 3–11)
AST: 15 U/L (ref 5–34)
Alkaline Phosphatase: 64 U/L (ref 40–150)
BUN: 35.5 mg/dL — AB (ref 7.0–26.0)
CALCIUM: 9.3 mg/dL (ref 8.4–10.4)
CHLORIDE: 106 meq/L (ref 98–109)
CO2: 22 meq/L (ref 22–29)
Creatinine: 1.4 mg/dL — ABNORMAL HIGH (ref 0.6–1.1)
Glucose: 101 mg/dl (ref 70–140)
POTASSIUM: 5.3 meq/L — AB (ref 3.5–5.1)
Sodium: 137 mEq/L (ref 136–145)
Total Bilirubin: 0.7 mg/dL (ref 0.20–1.20)
Total Protein: 7.6 g/dL (ref 6.4–8.3)

## 2013-09-21 LAB — CBC WITH DIFFERENTIAL/PLATELET
BASO%: 0.5 % (ref 0.0–2.0)
Basophils Absolute: 0 10*3/uL (ref 0.0–0.1)
EOS%: 3.5 % (ref 0.0–7.0)
Eosinophils Absolute: 0.2 10*3/uL (ref 0.0–0.5)
HEMATOCRIT: 26.1 % — AB (ref 34.8–46.6)
HGB: 8.5 g/dL — ABNORMAL LOW (ref 11.6–15.9)
LYMPH#: 0.9 10*3/uL (ref 0.9–3.3)
LYMPH%: 14.3 % (ref 14.0–49.7)
MCH: 30.1 pg (ref 25.1–34.0)
MCHC: 32.6 g/dL (ref 31.5–36.0)
MCV: 92.2 fL (ref 79.5–101.0)
MONO#: 0.4 10*3/uL (ref 0.1–0.9)
MONO%: 6.5 % (ref 0.0–14.0)
NEUT#: 5 10*3/uL (ref 1.5–6.5)
NEUT%: 75.2 % (ref 38.4–76.8)
Platelets: 252 10*3/uL (ref 145–400)
RBC: 2.83 10*6/uL — ABNORMAL LOW (ref 3.70–5.45)
RDW: 17.6 % — AB (ref 11.2–14.5)
WBC: 6.6 10*3/uL (ref 3.9–10.3)

## 2013-09-21 MED ORDER — CAPECITABINE 500 MG PO TABS: ORAL_TABLET | ORAL | Status: DC

## 2013-09-21 NOTE — Progress Notes (Signed)
  [place] OFFICE PROGRESS NOTE   Diagnosis:  Colon cancer  INTERVAL HISTORY:   Natalie Chapman returns as scheduled. She completed cycle 1 adjuvant Xeloda beginning 09/05/2013. She denies nausea/vomiting. No mouth sores. No diarrhea. No hand or foot pain or redness. She denies pain. She has a good appetite. The abdominal wound continues to heal.  Objective:  Vital signs in last 24 hours:  Blood pressure 112/58, pulse 80, temperature 97.8 F (36.6 C), temperature source Oral, resp. rate 18, height _0  (1.549 m), weight 237 lb 12.8 oz (107.865 kg), SpO2 97.00%.    HEENT: No thrush or ulcers. Resp: Lungs clear bilaterally. Cardio: Irregular. GI: Abdomen soft and nontender. Surgical incision at the right mid abdomen appears healed. Vascular: Trace edema at the lower legs bilaterally with chronic stasis changes.  Skin: Palms without erythema.    Lab Results:  Lab Results  Component Value Date   WBC 6.6 09/21/2013   HGB 8.5* 09/21/2013   HCT 26.1* 09/21/2013   MCV 92.2 09/21/2013   PLT 252 09/21/2013   NEUTROABS 5.0 09/21/2013    Imaging:  No results found.  Medications: I have reviewed the patient's current medications.  Assessment/Plan: 1. 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 8 of 15 lymph nodes contained metastatic carcinoma, tumor deposits were present; microsatellite stable. PET scan 08/24/2013 with mildly hypermetabolic small left axillary and right inguinal nodes--most likely unrelated to colon cancer, no clear evidence of metastatic disease. Cycle 1 adjuvant Xeloda 09/05/2013. 2. Atrial fibrillation 3. Anemia secondary to bleeding from the colon cancer versus renal insufficiency. Stable.  4. History of tobacco use with COPD changes on the CT 04/07/2013  5. Morbid obesity  6. Status post gastric banding surgery  7. Renal insufficiency   8. Anorexia-etiology unclear. Improved.    Disposition: Natalie Chapman appears stable. She has completed one cycle of adjuvant Xeloda. Plan to proceed with cycle 2 as scheduled beginning 09/26/2013. She will return for a followup visit on 10/13/2013. She will contact the office in the interim with any problems.    Ned Card ANP/GNP-BC   09/21/2013  2:54 PM

## 2013-09-21 NOTE — Telephone Encounter (Signed)
, °

## 2013-10-13 ENCOUNTER — Ambulatory Visit (HOSPITAL_BASED_OUTPATIENT_CLINIC_OR_DEPARTMENT_OTHER): Payer: Medicare Other | Admitting: Nurse Practitioner

## 2013-10-13 ENCOUNTER — Other Ambulatory Visit (HOSPITAL_BASED_OUTPATIENT_CLINIC_OR_DEPARTMENT_OTHER): Payer: Medicare Other

## 2013-10-13 VITALS — BP 113/58 | HR 77 | Temp 97.6°F | Resp 17 | Ht 61.0 in | Wt 238.5 lb

## 2013-10-13 DIAGNOSIS — C182 Malignant neoplasm of ascending colon: Secondary | ICD-10-CM

## 2013-10-13 DIAGNOSIS — Z418 Encounter for other procedures for purposes other than remedying health state: Secondary | ICD-10-CM

## 2013-10-13 DIAGNOSIS — I4891 Unspecified atrial fibrillation: Secondary | ICD-10-CM

## 2013-10-13 DIAGNOSIS — R630 Anorexia: Secondary | ICD-10-CM

## 2013-10-13 DIAGNOSIS — C189 Malignant neoplasm of colon, unspecified: Secondary | ICD-10-CM

## 2013-10-13 DIAGNOSIS — Z87891 Personal history of nicotine dependence: Secondary | ICD-10-CM

## 2013-10-13 DIAGNOSIS — N289 Disorder of kidney and ureter, unspecified: Secondary | ICD-10-CM

## 2013-10-13 DIAGNOSIS — C779 Secondary and unspecified malignant neoplasm of lymph node, unspecified: Secondary | ICD-10-CM

## 2013-10-13 DIAGNOSIS — J449 Chronic obstructive pulmonary disease, unspecified: Secondary | ICD-10-CM

## 2013-10-13 DIAGNOSIS — D649 Anemia, unspecified: Secondary | ICD-10-CM

## 2013-10-13 LAB — CBC WITH DIFFERENTIAL/PLATELET
BASO%: 0.6 % (ref 0.0–2.0)
Basophils Absolute: 0 10*3/uL (ref 0.0–0.1)
EOS%: 3.2 % (ref 0.0–7.0)
Eosinophils Absolute: 0.2 10*3/uL (ref 0.0–0.5)
HCT: 23.9 % — ABNORMAL LOW (ref 34.8–46.6)
HGB: 8 g/dL — ABNORMAL LOW (ref 11.6–15.9)
LYMPH#: 0.9 10*3/uL (ref 0.9–3.3)
LYMPH%: 13.7 % — ABNORMAL LOW (ref 14.0–49.7)
MCH: 33.5 pg (ref 25.1–34.0)
MCHC: 33.2 g/dL (ref 31.5–36.0)
MCV: 100.7 fL (ref 79.5–101.0)
MONO#: 0.5 10*3/uL (ref 0.1–0.9)
MONO%: 7.3 % (ref 0.0–14.0)
NEUT#: 5.1 10*3/uL (ref 1.5–6.5)
NEUT%: 75.2 % (ref 38.4–76.8)
Platelets: 279 10*3/uL (ref 145–400)
RBC: 2.38 10*6/uL — AB (ref 3.70–5.45)
RDW: 30.3 % — AB (ref 11.2–14.5)
WBC: 6.8 10*3/uL (ref 3.9–10.3)

## 2013-10-13 LAB — COMPREHENSIVE METABOLIC PANEL (CC13)
ALK PHOS: 67 U/L (ref 40–150)
ALT: 7 U/L (ref 0–55)
AST: 11 U/L (ref 5–34)
Albumin: 3.4 g/dL — ABNORMAL LOW (ref 3.5–5.0)
Anion Gap: 6 mEq/L (ref 3–11)
BUN: 27.5 mg/dL — AB (ref 7.0–26.0)
CO2: 22 mEq/L (ref 22–29)
Calcium: 9.6 mg/dL (ref 8.4–10.4)
Chloride: 109 mEq/L (ref 98–109)
Creatinine: 1.4 mg/dL — ABNORMAL HIGH (ref 0.6–1.1)
Glucose: 114 mg/dl (ref 70–140)
POTASSIUM: 4.9 meq/L (ref 3.5–5.1)
SODIUM: 137 meq/L (ref 136–145)
TOTAL PROTEIN: 7.6 g/dL (ref 6.4–8.3)
Total Bilirubin: 0.59 mg/dL (ref 0.20–1.20)

## 2013-10-13 MED ORDER — CAPECITABINE 500 MG PO TABS: ORAL_TABLET | ORAL | Status: DC

## 2013-10-13 NOTE — Progress Notes (Signed)
  [place] OFFICE PROGRESS NOTE   Diagnosis:  Colon cancer  INTERVAL HISTORY:   [name] returns as scheduled. She completed cycle 2 adjuvant Xeloda beginning 09/26/2013. She denies nausea/vomiting. No mouth sores. No diarrhea. No hand or foot pain or redness. Energy level continues to be poor. Weight is stable.  Objective:  Vital signs in last 24 hours:  Blood pressure 113/58, pulse 77, temperature 97.6 F (36.4 C), temperature source Oral, resp. rate 17, height $RemoveBe'5\' 1"'EirYFRdqc$  (1.549 m), weight 238 lb 8 oz (108.183 kg), SpO2 100.00%.    HEENT: No thrush or ulcers. Resp: Lungs clear bilaterally. Cardio: Irregular. GI: Abdomen is obese. Soft and nontender. Well-healed incision right mid abdomen. Vascular: Trace edema at the lower legs bilaterally with chronic stasis changes. Skin: Palms without erythema.    Lab Results:  Lab Results  Component Value Date   WBC 6.8 10/13/2013   HGB 8.0* 10/13/2013   HCT 23.9* 10/13/2013   MCV 100.7 10/13/2013   PLT 279 10/13/2013   NEUTROABS 5.1 10/13/2013    Imaging:  No results found.  Medications: I have reviewed the patient's current medications.  Assessment/Plan: 1. 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 8 of 15 lymph nodes contained metastatic carcinoma, tumor deposits were present; microsatellite stable. PET scan 08/24/2013 with mildly hypermetabolic small left axillary and right inguinal nodes--most likely unrelated to colon cancer, no clear evidence of metastatic disease.  Cycle 1 adjuvant Xeloda 09/05/2013. Cycle 2 adjuvant Xeloda 09/26/2013. 2. Atrial fibrillation 3. Anemia secondary to bleeding from the colon cancer versus renal insufficiency. Stable.  4. History of tobacco use with COPD changes on the CT 04/07/2013  5. Morbid obesity  6. Status post gastric banding surgery  7. Renal insufficiency   8. Anorexia-etiology unclear.   Disposition: Natalie Chapman appears stable. She has completed 2 cycles of adjuvant Xeloda. Plan to proceed with cycle 3 as schedule beginning 10/17/2013. She will return for a followup visit on 11/02/2013. She will call the office in the interim with any problems.    Ned Card ANP/GNP-BC   10/13/2013  2:54 PM

## 2013-10-30 ENCOUNTER — Ambulatory Visit: Payer: Medicare Other | Admitting: Cardiovascular Disease

## 2013-11-02 ENCOUNTER — Other Ambulatory Visit: Payer: Self-pay | Admitting: *Deleted

## 2013-11-02 ENCOUNTER — Other Ambulatory Visit (HOSPITAL_BASED_OUTPATIENT_CLINIC_OR_DEPARTMENT_OTHER): Payer: Medicare Other

## 2013-11-02 ENCOUNTER — Ambulatory Visit (HOSPITAL_BASED_OUTPATIENT_CLINIC_OR_DEPARTMENT_OTHER): Payer: Medicare Other | Admitting: Oncology

## 2013-11-02 VITALS — BP 114/58 | HR 84 | Temp 97.6°F | Resp 18 | Ht 61.0 in | Wt 243.2 lb

## 2013-11-02 DIAGNOSIS — C182 Malignant neoplasm of ascending colon: Secondary | ICD-10-CM

## 2013-11-02 DIAGNOSIS — C189 Malignant neoplasm of colon, unspecified: Secondary | ICD-10-CM

## 2013-11-02 DIAGNOSIS — D649 Anemia, unspecified: Secondary | ICD-10-CM

## 2013-11-02 DIAGNOSIS — N289 Disorder of kidney and ureter, unspecified: Secondary | ICD-10-CM

## 2013-11-02 DIAGNOSIS — I4891 Unspecified atrial fibrillation: Secondary | ICD-10-CM

## 2013-11-02 LAB — CBC WITH DIFFERENTIAL/PLATELET
BASO%: 0.7 % (ref 0.0–2.0)
Basophils Absolute: 0 10*3/uL (ref 0.0–0.1)
EOS ABS: 0.2 10*3/uL (ref 0.0–0.5)
EOS%: 4.1 % (ref 0.0–7.0)
HCT: 24.5 % — ABNORMAL LOW (ref 34.8–46.6)
HGB: 8.1 g/dL — ABNORMAL LOW (ref 11.6–15.9)
LYMPH%: 18.5 % (ref 14.0–49.7)
MCH: 35.6 pg — ABNORMAL HIGH (ref 25.1–34.0)
MCHC: 33.1 g/dL (ref 31.5–36.0)
MCV: 107.6 fL — AB (ref 79.5–101.0)
MONO#: 0.4 10*3/uL (ref 0.1–0.9)
MONO%: 6.6 % (ref 0.0–14.0)
NEUT%: 70.1 % (ref 38.4–76.8)
NEUTROS ABS: 3.7 10*3/uL (ref 1.5–6.5)
PLATELETS: 246 10*3/uL (ref 145–400)
RBC: 2.27 10*6/uL — ABNORMAL LOW (ref 3.70–5.45)
RDW: 29.7 % — ABNORMAL HIGH (ref 11.2–14.5)
WBC: 5.3 10*3/uL (ref 3.9–10.3)
lymph#: 1 10*3/uL (ref 0.9–3.3)

## 2013-11-02 MED ORDER — CAPECITABINE 500 MG PO TABS: ORAL_TABLET | ORAL | Status: DC

## 2013-11-02 NOTE — Progress Notes (Signed)
  [place] OFFICE PROGRESS NOTE   Diagnosis: Colon cancer  INTERVAL HISTORY:   Natalie Chapman returns as scheduled. She completed another cycle of Xelodabeginning 10/17/2013. No mouth sores, diarrhea, or hand/foot pain. She has noted a firm area at the right abdominal scar. No other complaint.    Objective:  Vital signs in last 24 hours:  There were no vitals taken for this visit.    HEENT: No thrush or ulcers Resp: Lungs clear bilaterally Cardio: Regular in rhythm GI: Nontender. Nodular tissue at the upper mid aspect of the right abdominal scar Vascular: Chronic stasis change at the lower leg bilaterally  Skin: Palms and soles without skin thickening or breakdown   Lab Results:  Lab Results  Component Value Date   WBC 5.3 11/02/2013   HGB 8.1* 11/02/2013   HCT 24.5* 11/02/2013   MCV 107.6* 11/02/2013   PLT 246 11/02/2013   NEUTROABS 3.7 11/02/2013   Medications: I have reviewed the patient's current medications.  Assessment/Plan: 1. 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 8 of 15 lymph nodes contained metastatic carcinoma, tumor deposits were present; microsatellite stable. PET scan 08/24/2013 with mildly hypermetabolic small left axillary and right inguinal nodes--most likely unrelated to colon cancer, no clear evidence of metastatic disease.  Cycle 1 adjuvant Xeloda 09/05/2013. Cycle 2 adjuvant Xeloda 09/26/2013 Cycle 3 adjuvant Xeloda 10/17/2013 2. Atrial fibrillation 3. Anemia secondary to bleeding from the colon cancer versus renal insufficiency, and Xeloda. Stable.  4. History of tobacco use with COPD changes on the CT 04/07/2013  5. Morbid obesity  6. Status post gastric banding surgery  7. Renal insufficiency  8. Anorexia-etiology unclear. Improved.   Disposition:  Natalie Chapman has completed 3 cycles of Xeloda. She appears to be tolerating  the chemotherapy well. The plan is to proceed with cycle 4 11/07/2013. She will return for an office visit in 3 weeks. Natalie Chapman  appears symptomatic from the anemia. The anemia is likely related to the colon cancer, chemotherapy, and renal insufficiency. We will recommend a trial of erythropoietin therapy if the hemoglobin falls or does not improve when she is off of Xeloda.  Betsy Coder, MD  11/02/2013  3:29 PM

## 2013-11-03 ENCOUNTER — Telehealth: Payer: Self-pay | Admitting: Oncology

## 2013-11-03 LAB — COMPREHENSIVE METABOLIC PANEL (CC13)
ALK PHOS: 64 U/L (ref 40–150)
ALT: 10 U/L (ref 0–55)
AST: 12 U/L (ref 5–34)
Albumin: 3.4 g/dL — ABNORMAL LOW (ref 3.5–5.0)
Anion Gap: 6 mEq/L (ref 3–11)
BILIRUBIN TOTAL: 0.68 mg/dL (ref 0.20–1.20)
BUN: 28.9 mg/dL — AB (ref 7.0–26.0)
CO2: 23 mEq/L (ref 22–29)
Calcium: 9.4 mg/dL (ref 8.4–10.4)
Chloride: 107 mEq/L (ref 98–109)
Creatinine: 1.3 mg/dL — ABNORMAL HIGH (ref 0.6–1.1)
GLUCOSE: 100 mg/dL (ref 70–140)
Potassium: 4.5 mEq/L (ref 3.5–5.1)
SODIUM: 136 meq/L (ref 136–145)
TOTAL PROTEIN: 7.1 g/dL (ref 6.4–8.3)

## 2013-11-03 NOTE — Telephone Encounter (Signed)
lvm for pt regarding to NOV appt...mailed pt appt sched/avs and letter °

## 2013-11-06 ENCOUNTER — Telehealth: Payer: Self-pay | Admitting: *Deleted

## 2013-11-06 NOTE — Telephone Encounter (Signed)
Called for results of labs from last week. Computer went down while she was here. Gave her results and will mail copy to home. Also provided with date/time of her next appointment.

## 2013-11-07 ENCOUNTER — Encounter: Payer: Self-pay | Admitting: Cardiovascular Disease

## 2013-11-07 ENCOUNTER — Ambulatory Visit (INDEPENDENT_AMBULATORY_CARE_PROVIDER_SITE_OTHER): Payer: Medicare Other | Admitting: Cardiovascular Disease

## 2013-11-07 VITALS — BP 132/78 | HR 72 | Ht 61.0 in | Wt 244.1 lb

## 2013-11-07 DIAGNOSIS — C189 Malignant neoplasm of colon, unspecified: Secondary | ICD-10-CM

## 2013-11-07 DIAGNOSIS — I482 Chronic atrial fibrillation, unspecified: Secondary | ICD-10-CM

## 2013-11-07 DIAGNOSIS — D500 Iron deficiency anemia secondary to blood loss (chronic): Secondary | ICD-10-CM

## 2013-11-07 NOTE — Assessment & Plan Note (Signed)
Good rate control  No anticoagulation while on chemo due to severe anemia  Reassess in March

## 2013-11-07 NOTE — Assessment & Plan Note (Signed)
Continue iron Rx  Retic count per Dr Blair Promise  Consider erythropoietin

## 2013-11-07 NOTE — Patient Instructions (Signed)
Your physician wants you to follow-up in:   Natalie Chapman will receive a reminder letter in the mail two months in advance. If you don't receive a letter, please call our office to schedule the follow-up appointment. Your physician recommends that you continue on your current medications as directed. Please refer to the Current Medication list given to you today.

## 2013-11-07 NOTE — Progress Notes (Signed)
Patient ID: Natalie Chapman, female   DOB: 1940-08-04, 73 y.o.   MRN: [id number] 73 yo obese female referred by Dr Zella Richer for preop clearance in May  She has no history of CHF, CAD but has afib.  Was started on xarelto and had GI bleed Led to discovery of stage 3 colon CA  Had hemicolectomy and is getting Xeledo chemoRx.  Has been anemic last Hct 24.5  Discussed not restarted anticoagulation Until she is done with chemoRx which may be February  In wheelchair a lot.  No chest pain dyspnea or palpitations Rate control is good.  Recovered from surgery well       ROS: Denies fever, malais, weight loss, blurry vision, decreased visual acuity, cough, sputum, SOB, hemoptysis, pleuritic pain, palpitaitons, heartburn, abdominal pain, melena, lower extremity edema, claudication, or rash.  All other systems reviewed and negative  General: Pale  Obese white female  HEENT: normal Neck supple with no adenopathy JVP normal no bruits no thyromegaly Lungs clear with no wheezing and good diaphragmatic motion Heart:  S1/S2 SEM  murmur, no rub, gallop or click PMI normal Abdomen: benighn, BS positve, no tenderness, no AAA no bruit.  No HSM or HJR Distal pulses intact with no bruits Plus 2 LE lymphedema Neuro non-focal Skin warm and dry No muscular weakness   Current Outpatient Prescriptions  Medication Sig Dispense Refill  . acetaminophen (TYLENOL) 650 MG CR tablet Take 1,300 mg by mouth 2 (two) times daily as needed for pain.       Marland Kitchen aspirin EC 81 MG tablet Take 81 mg by mouth daily.      Marland Kitchen atorvastatin (LIPITOR) 10 MG tablet Take 5 mg by mouth at bedtime.      . capecitabine (XELODA) 500 MG tablet Take 4 tabs QAM (2,000 mg) Take 3 tabs QPM (1,500 mg) PO for 14 days then 7 days off. Start Date 11/07/13  98 tablet  0  . Cholecalciferol (VITAMIN D3) 400 UNITS tablet Take 800 Units by mouth daily.      . enalapril (VASOTEC) 2.5 MG tablet Take 2.5 mg by mouth daily.       . ferrous fumarate (HEMOCYTE  - 106 MG FE) 325 (106 FE) MG TABS tablet Take 2 tablets by mouth daily.       . metoprolol succinate (TOPROL-XL) 25 MG 24 hr tablet Take 25 mg by mouth daily.       Marland Kitchen VITAMIN A PO Take 1 tablet by mouth daily.      . vitamin C (ASCORBIC ACID) 500 MG tablet Take 500 mg by mouth daily.      . Zinc Sulfate (ZINC 15 PO) Take 1 tablet by mouth daily.       No current facility-administered medications for this visit.    Allergies  Review of patient's allergies indicates no known allergies.  Electrocardiogram:  afib rate 84 nonspecific ST changes    Assessment and Plan

## 2013-11-07 NOTE — Assessment & Plan Note (Signed)
Stage 3  Post colectomy with adjuvant chemo  PET scan 08/24/13 looked promising with ? Mild activity at surgical border but no real hypermetobolic nodes in abdomen

## 2013-11-23 ENCOUNTER — Other Ambulatory Visit (HOSPITAL_BASED_OUTPATIENT_CLINIC_OR_DEPARTMENT_OTHER): Payer: Medicare Other

## 2013-11-23 ENCOUNTER — Other Ambulatory Visit: Payer: Self-pay

## 2013-11-23 ENCOUNTER — Telehealth: Payer: Self-pay | Admitting: Nurse Practitioner

## 2013-11-23 ENCOUNTER — Ambulatory Visit (HOSPITAL_BASED_OUTPATIENT_CLINIC_OR_DEPARTMENT_OTHER): Payer: Medicare Other | Admitting: Nurse Practitioner

## 2013-11-23 VITALS — BP 111/64 | HR 88 | Temp 97.8°F | Resp 18 | Ht 61.0 in | Wt 243.6 lb

## 2013-11-23 DIAGNOSIS — N289 Disorder of kidney and ureter, unspecified: Secondary | ICD-10-CM

## 2013-11-23 DIAGNOSIS — D630 Anemia in neoplastic disease: Secondary | ICD-10-CM

## 2013-11-23 DIAGNOSIS — C189 Malignant neoplasm of colon, unspecified: Secondary | ICD-10-CM

## 2013-11-23 DIAGNOSIS — R630 Anorexia: Secondary | ICD-10-CM

## 2013-11-23 DIAGNOSIS — N189 Chronic kidney disease, unspecified: Secondary | ICD-10-CM

## 2013-11-23 LAB — COMPREHENSIVE METABOLIC PANEL (CC13)
ALT: <6 U/L (ref 0–55)
ANION GAP: 6 meq/L (ref 3–11)
AST: 12 U/L (ref 5–34)
Albumin: 3.7 g/dL (ref 3.5–5.0)
Alkaline Phosphatase: 63 U/L (ref 40–150)
BILIRUBIN TOTAL: 0.83 mg/dL (ref 0.20–1.20)
BUN: 28.6 mg/dL — ABNORMAL HIGH (ref 7.0–26.0)
CO2: 23 meq/L (ref 22–29)
CREATININE: 1.4 mg/dL — AB (ref 0.6–1.1)
Calcium: 9.5 mg/dL (ref 8.4–10.4)
Chloride: 108 mEq/L (ref 98–109)
GLUCOSE: 115 mg/dL (ref 70–140)
Potassium: 4.3 mEq/L (ref 3.5–5.1)
Sodium: 138 mEq/L (ref 136–145)
TOTAL PROTEIN: 7.2 g/dL (ref 6.4–8.3)

## 2013-11-23 LAB — CBC WITH DIFFERENTIAL/PLATELET
BASO%: 0.5 % (ref 0.0–2.0)
BASOS ABS: 0 10*3/uL (ref 0.0–0.1)
EOS ABS: 0.3 10*3/uL (ref 0.0–0.5)
EOS%: 6.3 % (ref 0.0–7.0)
HEMATOCRIT: 24.1 % — AB (ref 34.8–46.6)
HGB: 8.1 g/dL — ABNORMAL LOW (ref 11.6–15.9)
LYMPH%: 16 % (ref 14.0–49.7)
MCH: 35.8 pg — ABNORMAL HIGH (ref 25.1–34.0)
MCHC: 33.6 g/dL (ref 31.5–36.0)
MCV: 106.6 fL — ABNORMAL HIGH (ref 79.5–101.0)
MONO#: 0.3 10*3/uL (ref 0.1–0.9)
MONO%: 6.1 % (ref 0.0–14.0)
NEUT%: 71.1 % (ref 38.4–76.8)
NEUTROS ABS: 3.2 10*3/uL (ref 1.5–6.5)
PLATELETS: 170 10*3/uL (ref 145–400)
RBC: 2.26 10*6/uL — ABNORMAL LOW (ref 3.70–5.45)
RDW: 21.2 % — ABNORMAL HIGH (ref 11.2–14.5)
WBC: 4.4 10*3/uL (ref 3.9–10.3)
lymph#: 0.7 10*3/uL — ABNORMAL LOW (ref 0.9–3.3)

## 2013-11-23 MED ORDER — CAPECITABINE 500 MG PO TABS: ORAL_TABLET | ORAL | Status: DC

## 2013-11-23 NOTE — Telephone Encounter (Signed)
Gave avs & cal. °

## 2013-11-23 NOTE — Progress Notes (Signed)
  [place] OFFICE PROGRESS NOTE   Diagnosis:  Colon cancer  INTERVAL HISTORY:   Ms. Natalie Chapman returns as scheduled. She completed cycle 4 Xeloda beginning 11/07/2013. She denies nausea/vomiting. No mouth sores. No diarrhea. No hand or foot pain or redness. She reports stable chronic leg swelling and erythema at the lower legs.  Objective:  Vital signs in last 24 hours:  Blood pressure 111/64, pulse 88, temperature 97.8 F (36.6 C), temperature source Oral, resp. rate 18, height _0  (1.549 m), weight 243 lb 9.6 oz (110.496 kg), SpO2 100 %.    HEENT: no thrush or ulcers. Resp: lungs clear bilaterally. Cardio: regular rate and rhythm. GI: abdomen soft and nontender. Vascular: chronic stasis changes at the lower legs bilaterally.  Skin:palms without erythema. Soles with mild erythema; no skin breakdown.    Lab Results:  Lab Results  Component Value Date   WBC 4.4 11/23/2013   HGB 8.1* 11/23/2013   HCT 24.1* 11/23/2013   MCV 106.6* 11/23/2013   PLT 170 11/23/2013   NEUTROABS 3.2 11/23/2013      Medications: I have reviewed the patient's current medications.  Assessment/Plan: 1. 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 8 of 15 lymph nodes contained metastatic carcinoma, tumor deposits were present; microsatellite stable.  PET scan 08/24/2013 with mildly hypermetabolic small left axillary and right inguinal nodes--most likely unrelated to colon cancer, no clear evidence of metastatic disease.   Cycle 1 adjuvant Xeloda 09/05/2013.  Cycle 2 adjuvant Xeloda 09/26/2013  Cycle 3 adjuvant Xeloda 10/17/2013  Cycle 4 adjuvant Xeloda 11/07/2013 2. Atrial fibrillation 3. Anemia secondary to bleeding from the colon cancer versus renal insufficiency, and Xeloda. Stable.  4. History of tobacco use with COPD changes on the CT 04/07/2013  5. Morbid obesity   6. Status post gastric banding surgery  7. Renal insufficiency  8. Anorexia-etiology unclear. Improved.   Disposition: Natalie Chapman appears stable. She has completed 4 cycles of adjuvant Xeloda. Plan to proceed with cycle 5 beginning 11/28/2013. She will return for a followup visit in 3 weeks. She will contact the office in the interim with any problems.    Ned Card ANP/GNP-BC   11/23/2013  3:17 PM

## 2013-12-15 ENCOUNTER — Ambulatory Visit (HOSPITAL_BASED_OUTPATIENT_CLINIC_OR_DEPARTMENT_OTHER): Payer: Medicare Other | Admitting: Oncology

## 2013-12-15 ENCOUNTER — Telehealth: Payer: Self-pay | Admitting: Oncology

## 2013-12-15 ENCOUNTER — Other Ambulatory Visit (HOSPITAL_BASED_OUTPATIENT_CLINIC_OR_DEPARTMENT_OTHER): Payer: Medicare Other

## 2013-12-15 VITALS — BP 118/64 | HR 96 | Temp 97.4°F | Resp 18 | Ht 61.0 in | Wt 240.8 lb

## 2013-12-15 DIAGNOSIS — C189 Malignant neoplasm of colon, unspecified: Secondary | ICD-10-CM

## 2013-12-15 DIAGNOSIS — D649 Anemia, unspecified: Secondary | ICD-10-CM

## 2013-12-15 DIAGNOSIS — C182 Malignant neoplasm of ascending colon: Secondary | ICD-10-CM

## 2013-12-15 DIAGNOSIS — N289 Disorder of kidney and ureter, unspecified: Secondary | ICD-10-CM

## 2013-12-15 LAB — COMPREHENSIVE METABOLIC PANEL (CC13)
ALBUMIN: 3.9 g/dL (ref 3.5–5.0)
ALT: 8 U/L (ref 0–55)
ANION GAP: 10 meq/L (ref 3–11)
AST: 11 U/L (ref 5–34)
Alkaline Phosphatase: 65 U/L (ref 40–150)
BUN: 25.9 mg/dL (ref 7.0–26.0)
CO2: 18 meq/L — AB (ref 22–29)
Calcium: 9.9 mg/dL (ref 8.4–10.4)
Chloride: 111 mEq/L — ABNORMAL HIGH (ref 98–109)
Creatinine: 1.4 mg/dL — ABNORMAL HIGH (ref 0.6–1.1)
EGFR: 37 mL/min/{1.73_m2} — AB (ref >90)
GLUCOSE: 117 mg/dL (ref 70–140)
POTASSIUM: 5.4 meq/L — AB (ref 3.5–5.1)
SODIUM: 139 meq/L (ref 136–145)
TOTAL PROTEIN: 7.4 g/dL (ref 6.4–8.3)
Total Bilirubin: 1.21 mg/dL — ABNORMAL HIGH (ref 0.20–1.20)

## 2013-12-15 LAB — CBC WITH DIFFERENTIAL/PLATELET
BASO%: 0.2 % (ref 0.0–2.0)
BASOS ABS: 0 10*3/uL (ref 0.0–0.1)
EOS ABS: 0.1 10*3/uL (ref 0.0–0.5)
EOS%: 2.6 % (ref 0.0–7.0)
HEMATOCRIT: 24.2 % — AB (ref 34.8–46.6)
HGB: 8 g/dL — ABNORMAL LOW (ref 11.6–15.9)
LYMPH#: 0.3 10*3/uL — AB (ref 0.9–3.3)
LYMPH%: 7.4 % — ABNORMAL LOW (ref 14.0–49.7)
MCH: 36 pg — ABNORMAL HIGH (ref 25.1–34.0)
MCHC: 33.1 g/dL (ref 31.5–36.0)
MCV: 109 fL — ABNORMAL HIGH (ref 79.5–101.0)
MONO#: 0.3 10*3/uL (ref 0.1–0.9)
MONO%: 6 % (ref 0.0–14.0)
NEUT%: 83.8 % — AB (ref 38.4–76.8)
NEUTROS ABS: 3.5 10*3/uL (ref 1.5–6.5)
Platelets: 180 10*3/uL (ref 145–400)
RBC: 2.22 10*6/uL — ABNORMAL LOW (ref 3.70–5.45)
RDW: 19.6 % — AB (ref 11.2–14.5)
WBC: 4.2 10*3/uL (ref 3.9–10.3)
nRBC: 0 % (ref 0–0)

## 2013-12-15 MED ORDER — CAPECITABINE 500 MG PO TABS: ORAL_TABLET | ORAL | Status: DC

## 2013-12-15 NOTE — Telephone Encounter (Signed)
Pt confirmed labs/ov per 12/04 POF, gave pt AVS..... KJ

## 2013-12-15 NOTE — Progress Notes (Signed)
  [place] OFFICE PROGRESS NOTE   Diagnosis: Colon cancer  INTERVAL HISTORY:   Ms. Coulibaly returns as scheduled. She feels well. She completed another cycle of Xeloda beginning on 11/28/2013. No mouth sores, diarrhea, or hand/foot pain. She can ambulate in the home.  Objective:  Vital signs in last 24 hours:  Blood pressure 118/64, pulse 96, temperature 97.4 F (36.3 C), temperature source Oral, resp. rate 18, height $RemoveBe'5\' 1"'xLFethjnY$  (1.549 m), weight 240 lb 12.8 oz (109.226 kg), SpO2 100 %.    HEENT: No thrush or ulcers Resp: End inspiratory rales at the left posterior base, no respiratory distress Cardio: Irregular GI: Nontender Vascular: Chronic stasis change at the lower leg bilaterally  Skin: Palms without erythema. Mild erythema at the soles. No skin breakdown.     Lab Results:  Lab Results  Component Value Date   WBC 4.4 11/23/2013   HGB 8.1* 11/23/2013   HCT 24.1* 11/23/2013   MCV 106.6* 11/23/2013   PLT 170 11/23/2013   NEUTROABS 3.2 11/23/2013    Medications: I have reviewed the patient's current medications.  Assessment/Plan: 1. 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 8 of 15 lymph nodes contained metastatic carcinoma, tumor deposits were present; microsatellite stable.  PET scan 08/24/2013 with mildly hypermetabolic small left axillary and right inguinal nodes--most likely unrelated to colon cancer, no clear evidence of metastatic disease.   Cycle 1 adjuvant Xeloda 09/05/2013.  Cycle 2 adjuvant Xeloda 09/26/2013  Cycle 3 adjuvant Xeloda 10/17/2013  Cycle 4 adjuvant Xeloda 11/07/2013  Cycle 5 adjuvant Xeloda 11/28/2013 2. Atrial fibrillation 3. Anemia secondary to bleeding from the colon cancer versus renal insufficiency, and Xeloda. Stable.  4. History of tobacco use with COPD changes on the CT 04/07/2013  5. Morbid obesity  6. Status  post gastric banding surgery  7. Renal insufficiency  8. Anorexia-etiology unclear. Improved.   Disposition:  She continues to tolerate the Xeloda well. She will begin cycle 6 on 12/19/2013. Ms. Bivens will return for an office visit 01/09/2014. She has stable anemia. She does not appear symptomatic from the anemia. We will evaluate the anemia further at the completion of chemotherapy. The anemia is most likely related to renal insufficiency, but she could have another hematologic process such as myelodysplasia. We will check an erythropoietin level when she returns on 01/09/2014.  Betsy Coder, MD  12/15/2013  12:09 PM

## 2013-12-19 ENCOUNTER — Telehealth: Payer: Self-pay | Admitting: *Deleted

## 2013-12-19 NOTE — Telephone Encounter (Signed)
-----   Message from Ladell Pier, MD sent at 12/16/2013  8:14 AM EST ----- Please call patient,repeat cmet here or with pcp next 1 week Copy to pcp, may want to hold the ACE inhibitor

## 2013-12-19 NOTE — Telephone Encounter (Signed)
Per Dr. Benay Spice; notified pt that she needs to get labs re-checked this week here or at PCP.  Pt states that Dr. Fredderick Phenix will be coming tomorrow to check on her.  Information given to pt for MD to re-check CMET; copy of labs on 12/4 sent to PCP office.  Pt verbalized understanding and expressed appreciation for call.

## 2013-12-21 ENCOUNTER — Telehealth: Payer: Self-pay | Admitting: Oncology

## 2013-12-21 NOTE — Telephone Encounter (Signed)
Faxed pt labs to  [facility] [phone]

## 2014-01-09 ENCOUNTER — Other Ambulatory Visit (HOSPITAL_BASED_OUTPATIENT_CLINIC_OR_DEPARTMENT_OTHER): Payer: Medicare Other

## 2014-01-09 ENCOUNTER — Ambulatory Visit (HOSPITAL_BASED_OUTPATIENT_CLINIC_OR_DEPARTMENT_OTHER): Payer: Medicare Other | Admitting: Oncology

## 2014-01-09 VITALS — BP 121/55 | HR 79 | Temp 97.4°F | Resp 19 | Ht 61.0 in | Wt 241.7 lb

## 2014-01-09 DIAGNOSIS — D649 Anemia, unspecified: Secondary | ICD-10-CM

## 2014-01-09 DIAGNOSIS — C182 Malignant neoplasm of ascending colon: Secondary | ICD-10-CM

## 2014-01-09 DIAGNOSIS — I4891 Unspecified atrial fibrillation: Secondary | ICD-10-CM

## 2014-01-09 DIAGNOSIS — N289 Disorder of kidney and ureter, unspecified: Secondary | ICD-10-CM

## 2014-01-09 DIAGNOSIS — R630 Anorexia: Secondary | ICD-10-CM

## 2014-01-09 DIAGNOSIS — C189 Malignant neoplasm of colon, unspecified: Secondary | ICD-10-CM

## 2014-01-09 DIAGNOSIS — C779 Secondary and unspecified malignant neoplasm of lymph node, unspecified: Secondary | ICD-10-CM

## 2014-01-09 LAB — CBC WITH DIFFERENTIAL/PLATELET
BASO%: 0.8 % (ref 0.0–2.0)
Basophils Absolute: 0 10*3/uL (ref 0.0–0.1)
EOS%: 5 % (ref 0.0–7.0)
Eosinophils Absolute: 0.2 10*3/uL (ref 0.0–0.5)
HCT: 27.3 % — ABNORMAL LOW (ref 34.8–46.6)
HGB: 8.8 g/dL — ABNORMAL LOW (ref 11.6–15.9)
LYMPH%: 15.3 % (ref 14.0–49.7)
MCH: 36.5 pg — ABNORMAL HIGH (ref 25.1–34.0)
MCHC: 32.3 g/dL (ref 31.5–36.0)
MCV: 113.1 fL — ABNORMAL HIGH (ref 79.5–101.0)
MONO#: 0.3 10*3/uL (ref 0.1–0.9)
MONO%: 7.1 % (ref 0.0–14.0)
NEUT#: 2.6 10*3/uL (ref 1.5–6.5)
NEUT%: 71.8 % (ref 38.4–76.8)
Platelets: 207 10*3/uL (ref 145–400)
RBC: 2.41 10*6/uL — AB (ref 3.70–5.45)
RDW: 20.6 % — ABNORMAL HIGH (ref 11.2–14.5)
WBC: 3.6 10*3/uL — ABNORMAL LOW (ref 3.9–10.3)
lymph#: 0.6 10*3/uL — ABNORMAL LOW (ref 0.9–3.3)

## 2014-01-09 NOTE — Progress Notes (Signed)
  [place] OFFICE PROGRESS NOTE   Diagnosis: Colon cancer  INTERVAL HISTORY:   Ms. Gensel returns as scheduled. She completed another cycle of Xeloda beginning 12/19/2013. No mouth sores, diarrhea, or nausea. No new complaint. She feels well.  Objective:  Vital signs in last 24 hours:  Blood pressure 121/55, pulse 79, temperature 97.4 F (36.3 C), temperature source Oral, resp. rate 19, height $RemoveBe'5\' 1"'IyNLHkysc$  (1.549 m), weight 241 lb 11.2 oz (109.634 kg), SpO2 99 %.    HEENT: No thrush or ulcers Resp: Decreased breath sounds at the bases, no respiratory distress Cardio: Irregular GI: Soft, nontender, no hepatomegaly Vascular: Chronic stasis change with edema at the lower leg and foot bilaterally  Skin: Palms and soles without erythema     Lab Results:  Lab Results  Component Value Date   WBC 3.6* 01/09/2014   HGB 8.8* 01/09/2014   HCT 27.3* 01/09/2014   MCV 113.1* 01/09/2014   PLT 207 01/09/2014   NEUTROABS 2.6 01/09/2014     Medications: I have reviewed the patient's current medications.  Assessment/Plan: 1. 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 8 of 15 lymph nodes contained metastatic carcinoma, tumor deposits were present; microsatellite stable.  PET scan 08/24/2013 with mildly hypermetabolic small left axillary and right inguinal nodes--most likely unrelated to colon cancer, no clear evidence of metastatic disease.   Cycle 1 adjuvant Xeloda 09/05/2013.  Cycle 2 adjuvant Xeloda 09/26/2013  Cycle 3 adjuvant Xeloda 10/17/2013  Cycle 4 adjuvant Xeloda 11/07/2013  Cycle 5 adjuvant Xeloda 11/28/2013  Cycle 6 adjuvant Xeloda 12/19/2013 2. Atrial fibrillation 3. Anemia secondary to bleeding from the colon cancer versus renal insufficiency, and Xeloda. Stable.  4. History of tobacco use with COPD changes on the CT 04/07/2013  5. Morbid obesity   6. Status post gastric banding surgery  7. Renal insufficiency  8. Anorexia-etiology unclear. Improved.   Disposition:  She has completed 6 cycles of adjuvant Xeloda. She continues to tolerate the Xeloda well. The plan is to begin cycle 7 today. Ms. Abbs will return for an office and lab visit on 01/26/2014.  Betsy Coder, MD  01/09/2014  12:21 PM

## 2014-01-11 LAB — ERYTHROPOIETIN: Erythropoietin: 34.4 m[IU]/mL — ABNORMAL HIGH (ref 2.6–18.5)

## 2014-01-19 ENCOUNTER — Emergency Department (HOSPITAL_COMMUNITY)
Admission: EM | Admit: 2014-01-19 | Discharge: 2014-01-19 | Disposition: A | Discharge status: Home / Self Care | Payer: Medicare Other | Attending: Emergency Medicine | Admitting: Emergency Medicine

## 2014-01-19 ENCOUNTER — Emergency Department (HOSPITAL_COMMUNITY): Payer: Medicare Other

## 2014-01-19 ENCOUNTER — Encounter (HOSPITAL_COMMUNITY): Payer: Self-pay | Admitting: Nurse Practitioner

## 2014-01-19 DIAGNOSIS — R4182 Altered mental status, unspecified: Secondary | ICD-10-CM | POA: Diagnosis present

## 2014-01-19 DIAGNOSIS — R51 Headache: Secondary | ICD-10-CM | POA: Diagnosis not present

## 2014-01-19 DIAGNOSIS — N181 Chronic kidney disease, stage 1: Secondary | ICD-10-CM | POA: Insufficient documentation

## 2014-01-19 DIAGNOSIS — I129 Hypertensive chronic kidney disease with stage 1 through stage 4 chronic kidney disease, or unspecified chronic kidney disease: Secondary | ICD-10-CM | POA: Diagnosis not present

## 2014-01-19 DIAGNOSIS — N390 Urinary tract infection, site not specified: Secondary | ICD-10-CM | POA: Diagnosis not present

## 2014-01-19 DIAGNOSIS — Z8669 Personal history of other diseases of the nervous system and sense organs: Secondary | ICD-10-CM | POA: Diagnosis not present

## 2014-01-19 DIAGNOSIS — Z7982 Long term (current) use of aspirin: Secondary | ICD-10-CM | POA: Insufficient documentation

## 2014-01-19 DIAGNOSIS — Z85038 Personal history of other malignant neoplasm of large intestine: Secondary | ICD-10-CM | POA: Diagnosis not present

## 2014-01-19 DIAGNOSIS — R519 Headache, unspecified: Secondary | ICD-10-CM

## 2014-01-19 DIAGNOSIS — D649 Anemia, unspecified: Secondary | ICD-10-CM | POA: Insufficient documentation

## 2014-01-19 DIAGNOSIS — Z79899 Other long term (current) drug therapy: Secondary | ICD-10-CM | POA: Diagnosis not present

## 2014-01-19 DIAGNOSIS — Z87891 Personal history of nicotine dependence: Secondary | ICD-10-CM | POA: Insufficient documentation

## 2014-01-19 DIAGNOSIS — E780 Pure hypercholesterolemia: Secondary | ICD-10-CM | POA: Insufficient documentation

## 2014-01-19 LAB — CBC WITH DIFFERENTIAL/PLATELET
Basophils Absolute: 0 10*3/uL (ref 0.0–0.1)
Basophils Relative: 1 % (ref 0–1)
Eosinophils Absolute: 0.1 10*3/uL (ref 0.0–0.7)
Eosinophils Relative: 3 % (ref 0–5)
HCT: 25.9 % — ABNORMAL LOW (ref 36.0–46.0)
HEMOGLOBIN: 8.7 g/dL — AB (ref 12.0–15.0)
LYMPHS ABS: 0.9 10*3/uL (ref 0.7–4.0)
Lymphocytes Relative: 30 % (ref 12–46)
MCH: 36.7 pg — AB (ref 26.0–34.0)
MCHC: 33.6 g/dL (ref 30.0–36.0)
MCV: 109.3 fL — ABNORMAL HIGH (ref 78.0–100.0)
Monocytes Absolute: 0.2 10*3/uL (ref 0.1–1.0)
Monocytes Relative: 5 % (ref 3–12)
NEUTROS PCT: 61 % (ref 43–77)
Neutro Abs: 1.8 10*3/uL (ref 1.7–7.7)
Platelets: 182 10*3/uL (ref 150–400)
RBC: 2.37 MIL/uL — ABNORMAL LOW (ref 3.87–5.11)
RDW: 16.7 % — AB (ref 11.5–15.5)
WBC: 2.9 10*3/uL — ABNORMAL LOW (ref 4.0–10.5)

## 2014-01-19 LAB — BASIC METABOLIC PANEL
Anion gap: 3 — ABNORMAL LOW (ref 5–15)
BUN: 27 mg/dL — AB (ref 6–23)
CALCIUM: 9.1 mg/dL (ref 8.4–10.5)
CHLORIDE: 111 meq/L (ref 96–112)
CO2: 23 mmol/L (ref 19–32)
Creatinine, Ser: 1.26 mg/dL — ABNORMAL HIGH (ref 0.50–1.10)
GFR calc non Af Amer: 41 mL/min — ABNORMAL LOW (ref >90)
GFR, EST AFRICAN AMERICAN: 47 mL/min — AB (ref >90)
GLUCOSE: 123 mg/dL — AB (ref 70–99)
POTASSIUM: 4.7 mmol/L (ref 3.5–5.1)
SODIUM: 137 mmol/L (ref 135–145)

## 2014-01-19 LAB — URINALYSIS, ROUTINE W REFLEX MICROSCOPIC
BILIRUBIN URINE: NEGATIVE
Glucose, UA: NEGATIVE mg/dL
HGB URINE DIPSTICK: NEGATIVE
KETONES UR: NEGATIVE mg/dL
Nitrite: NEGATIVE
Protein, ur: NEGATIVE mg/dL
Specific Gravity, Urine: 1.016 (ref 1.005–1.030)
Urobilinogen, UA: 0.2 mg/dL (ref 0.0–1.0)
pH: 5 (ref 5.0–8.0)

## 2014-01-19 LAB — URINE MICROSCOPIC-ADD ON

## 2014-01-19 MED ORDER — NITROFURANTOIN MONOHYD MACRO 100 MG PO CAPS
100.0000 mg | ORAL_CAPSULE | Freq: Once | ORAL | Status: AC
  Administered 2014-01-19: 100 mg via ORAL
  Filled 2014-01-19: qty 1

## 2014-01-19 MED ORDER — NITROFURANTOIN MONOHYD MACRO 100 MG PO CAPS: 100.0000 mg | ORAL_CAPSULE | Freq: Two times a day (BID) | ORAL | Status: DC

## 2014-01-19 MED ORDER — ACETAMINOPHEN 500 MG PO TABS
1000.0000 mg | ORAL_TABLET | Freq: Once | ORAL | Status: AC
  Administered 2014-01-19: 1000 mg via ORAL
  Filled 2014-01-19: qty 2

## 2014-01-19 NOTE — ED Notes (Signed)
Pt presented from home, family report altered mental status, Pt AOx4 at this time, negative stroke screen, pt c/o of a headache 5/10, pt oncology pt on last round of chemo for Colon CA. Denies shortness of breath, chest pain or change in vision.

## 2014-01-19 NOTE — ED Notes (Signed)
Bed: WA04 Expected date:  Expected time:  Means of arrival:  Comments: EMS/AMS 

## 2014-01-19 NOTE — ED Notes (Signed)
Patient transported to CT 

## 2014-01-19 NOTE — ED Notes (Signed)
Per EMS daughter came by to see her mom today and that she was sitting with food in her mouth. When asked about why she had food in her mouth she denied having any and was not acting "normal" and her speech seemed slured. Daughter became concerned and wanted her evaluated.   Negative for Stroke in the field. CBG 206, BP 160/95, Pulse 90 and 98% on Room Air. Pt has not c/o at this time.

## 2014-01-19 NOTE — Discharge Instructions (Signed)
Increase fluids. Antibiotic twice a day. Tylenol for headache. Return if worse or follow-up your primary care doctor.

## 2014-01-19 NOTE — ED Provider Notes (Signed)
CSN: [id number]     Arrival date & time 01/19/14  1506 History   First MD Initiated Contact with Patient 01/19/14 1531     Chief Complaint  Patient presents with  . Headache  . Altered Mental Status     (Consider location/radiation/quality/duration/timing/severity/associated sxs/prior Treatment) HPI.... Daughter reports a change in mental status proximally to 14:30  this afternoon. She reports  patient was disoriented with slurred speech.  She now complains of right temporal headache. Otherwise symptoms have completely disappeared. No chest pain, dyspnea, fever, sweats, chills, dysuria, cough. Daughter says she is back to baseline.  Past Medical History  Diagnosis Date  . Hypertension   . Anemia   . Cataracts, bilateral 04-18-13    both -immmature  . Dysrhythmia 04-18-13    irregular heart beat "Atrial Fib"-Dr. Tripp,PCP follows  . Shortness of breath   . Venous stasis of both lower extremities     2010 - right worse left, but now healed  . H/O hiatal hernia   . Constipation   . High cholesterol   . Atrial fibrillation   . Arthritis     "left hip" (07/20/2013)  . Chronic kidney disease (CKD), stage I   . Colon cancer dx'd 2015   Past Surgical History  Procedure Laterality Date  . Tubal ligation    . Stomach stapling      [facility]- 80's  . Hernia repair      abdomen  . Tonsillectomy    . Colonoscopy N/A 05/02/2013    Procedure: COLONOSCOPY;  Surgeon: Jeryl Columbia, MD;  Location: [facility];  Service: Endoscopy;  Laterality: N/A;  . Cholecystectomy  1980's  . Esophagogastroduodenoscopy endoscopy    . Laparoscopic right colectomy  07/20/2013  . Incisional hernia repair    . Laparoscopic partial colectomy N/A 07/20/2013    Procedure: LAPAROSCOPIC  ASSISTED RIGHT COLECTOMY;  Surgeon: Odis Hollingshead, MD;  Location: [facility];  Service: General;  Laterality: N/A;   Family History  Problem Relation Age of Onset  . Heart disease Mother   . Heart disease Father    History  Substance  Use Topics  . Smoking status: Former Smoker -- 1.00 packs/day for 30 years    Types: Cigarettes    Quit date: 04/19/1990  . Smokeless tobacco: Never Used  . Alcohol Use: No   OB History    No data available     Review of Systems  All other systems reviewed and are negative.     Allergies  Review of patient's allergies indicates no known allergies.  Home Medications   Prior to Admission medications   Medication Sig Start Date End Date Taking? Authorizing Provider  acetaminophen (TYLENOL) 650 MG CR tablet Take 1,300 mg by mouth 2 (two) times daily as needed for pain.    Yes Historical Provider, MD  aspirin EC 81 MG tablet Take 81 mg by mouth daily.   Yes Historical Provider, MD  atorvastatin (LIPITOR) 10 MG tablet Take 5 mg by mouth daily with breakfast.    Yes Historical Provider, MD  capecitabine (XELODA) 500 MG tablet Take 4 tabs every AM (2,000 mg) Take 3 tabs  Every PM (1,500 mg) PO for 14 days then 7 days off. Start Date 11/28/13 Patient taking differently: Take 1,500-2,000 mg by mouth 2 (two) times daily. Take 4 tabs every AM (2,000 mg) Take 3 tabs  Every PM (1,500 mg) PO for 14 days then 7 days off. Start Date 11/28/13 12/15/13  Yes Izola Price  Sherrill, MD  Cholecalciferol (VITAMIN D3) 400 UNITS tablet Take 800 Units by mouth daily.   Yes Historical Provider, MD  docusate sodium (COLACE) 100 MG capsule Take 100 mg by mouth daily as needed for mild constipation.   Yes Historical Provider, MD  enalapril (VASOTEC) 2.5 MG tablet Take 2.5 mg by mouth daily.    Yes Historical Provider, MD  ferrous sulfate 325 (65 FE) MG tablet Take 650 mg by mouth daily with breakfast.   Yes Historical Provider, MD  metoprolol succinate (TOPROL-XL) 25 MG 24 hr tablet Take 25 mg by mouth daily.    Yes Historical Provider, MD  VITAMIN A PO Take 1 tablet by mouth daily.   Yes Historical Provider, MD  vitamin C (ASCORBIC ACID) 500 MG tablet Take 500 mg by mouth daily.   Yes Historical Provider, MD  Zinc  Sulfate (ZINC 15 PO) Take 1 tablet by mouth daily.   Yes Historical Provider, MD  nitrofurantoin, macrocrystal-monohydrate, (MACROBID) 100 MG capsule Take 1 capsule (100 mg total) by mouth 2 (two) times daily. 01/19/14   Nat Christen, MD   BP 103/52 mmHg  Pulse 77  Temp(Src) 97.5 F (36.4 C) (Oral)  Resp 16  SpO2 100% Physical Exam  Constitutional: She is oriented to person, place, and time. She appears well-developed and well-nourished.  Pleasant, conversant, alert and oriented 3  HENT:  Head: Normocephalic and atraumatic.  Eyes: Conjunctivae and EOM are normal. Pupils are equal, round, and reactive to light.  Neck: Normal range of motion. Neck supple.  Cardiovascular: Normal rate and regular rhythm.   Pulmonary/Chest: Effort normal and breath sounds normal.  Abdominal: Soft. Bowel sounds are normal.  Musculoskeletal: Normal range of motion.  Neurological: She is alert and oriented to person, place, and time.  Skin: Skin is warm and dry.  Psychiatric: She has a normal mood and affect. Her behavior is normal.  Nursing note and vitals reviewed.   ED Course  Procedures (including critical care time) Labs Review Labs Reviewed  BASIC METABOLIC PANEL - Abnormal; Notable for the following:    Glucose, Bld 123 (*)    BUN 27 (*)    Creatinine, Ser 1.26 (*)    GFR calc non Af Amer 41 (*)    GFR calc Af Amer 47 (*)    Anion gap 3 (*)    All other components within normal limits  CBC WITH DIFFERENTIAL - Abnormal; Notable for the following:    WBC 2.9 (*)    RBC 2.37 (*)    Hemoglobin 8.7 (*)    HCT 25.9 (*)    MCV 109.3 (*)    MCH 36.7 (*)    RDW 16.7 (*)    All other components within normal limits  URINALYSIS, ROUTINE W REFLEX MICROSCOPIC - Abnormal; Notable for the following:    Leukocytes, UA MODERATE (*)    All other components within normal limits  URINE CULTURE  URINE MICROSCOPIC-ADD ON    Imaging Review Ct Head Wo Contrast  01/19/2014   CLINICAL DATA:  Initial  encounter for headache and altered mental status thickening today.  EXAM: CT HEAD WITHOUT CONTRAST  TECHNIQUE: Contiguous axial images were obtained from the base of the skull through the vertex without intravenous contrast.  COMPARISON:  None.  FINDINGS: There is no evidence for acute hemorrhage, hydrocephalus, mass lesion, or abnormal extra-axial fluid collection. No definite CT evidence for acute infarction. Diffuse loss of parenchymal volume is consistent with atrophy. The visualized paranasal sinuses and mastoid  air cells are clear.  IMPRESSION: Mild atrophy without acute intracranial abnormality.   Electronically Signed   By: Misty Stanley M.D.   On: 01/19/2014 16:56     EKG Interpretation None      MDM   Final diagnoses:  Headache  UTI (lower urinary tract infection)    H and appears to be back to baseline. History suggests a possible TIA. Urinalysis shows evidence of infection. We'll start Macrobid. Patient will follow up with primary care for further workup.    Nat Christen, MD 01/19/14 1929

## 2014-01-19 NOTE — ED Notes (Signed)
Pt's family insisted on helping pt wit ADL for ambulating to the bathroom. Nurse tech on standby but family with pt in the bathroom.

## 2014-01-21 LAB — URINE CULTURE

## 2014-01-26 ENCOUNTER — Ambulatory Visit (HOSPITAL_BASED_OUTPATIENT_CLINIC_OR_DEPARTMENT_OTHER): Payer: Medicare Other | Admitting: Nurse Practitioner

## 2014-01-26 ENCOUNTER — Other Ambulatory Visit (HOSPITAL_BASED_OUTPATIENT_CLINIC_OR_DEPARTMENT_OTHER): Payer: Medicare Other

## 2014-01-26 ENCOUNTER — Other Ambulatory Visit: Payer: Self-pay | Admitting: *Deleted

## 2014-01-26 ENCOUNTER — Telehealth: Payer: Self-pay | Admitting: Nurse Practitioner

## 2014-01-26 VITALS — BP 115/41 | HR 43 | Temp 97.0°F | Resp 18 | Ht 61.0 in | Wt 241.6 lb

## 2014-01-26 DIAGNOSIS — R630 Anorexia: Secondary | ICD-10-CM

## 2014-01-26 DIAGNOSIS — C189 Malignant neoplasm of colon, unspecified: Secondary | ICD-10-CM

## 2014-01-26 DIAGNOSIS — N289 Disorder of kidney and ureter, unspecified: Secondary | ICD-10-CM

## 2014-01-26 DIAGNOSIS — I4891 Unspecified atrial fibrillation: Secondary | ICD-10-CM

## 2014-01-26 DIAGNOSIS — J449 Chronic obstructive pulmonary disease, unspecified: Secondary | ICD-10-CM

## 2014-01-26 DIAGNOSIS — D649 Anemia, unspecified: Secondary | ICD-10-CM

## 2014-01-26 DIAGNOSIS — C182 Malignant neoplasm of ascending colon: Secondary | ICD-10-CM

## 2014-01-26 DIAGNOSIS — C779 Secondary and unspecified malignant neoplasm of lymph node, unspecified: Secondary | ICD-10-CM

## 2014-01-26 LAB — COMPREHENSIVE METABOLIC PANEL (CC13)
ALBUMIN: 3.9 g/dL (ref 3.5–5.0)
ALK PHOS: 59 U/L (ref 40–150)
AST: 9 U/L (ref 5–34)
Anion Gap: 11 mEq/L (ref 3–11)
BUN: 19.4 mg/dL (ref 7.0–26.0)
CALCIUM: 8.8 mg/dL (ref 8.4–10.4)
CHLORIDE: 106 meq/L (ref 98–109)
CO2: 20 mEq/L — ABNORMAL LOW (ref 22–29)
CREATININE: 1.3 mg/dL — AB (ref 0.6–1.1)
EGFR: 42 mL/min/{1.73_m2} — AB (ref >90)
GLUCOSE: 102 mg/dL (ref 70–140)
Potassium: 4.1 mEq/L (ref 3.5–5.1)
Sodium: 137 mEq/L (ref 136–145)
Total Bilirubin: 1.13 mg/dL (ref 0.20–1.20)
Total Protein: 7.1 g/dL (ref 6.4–8.3)

## 2014-01-26 LAB — CBC WITH DIFFERENTIAL/PLATELET
BASO%: 0.6 % (ref 0.0–2.0)
BASOS ABS: 0 10*3/uL (ref 0.0–0.1)
EOS%: 5.8 % (ref 0.0–7.0)
Eosinophils Absolute: 0.2 10*3/uL (ref 0.0–0.5)
HCT: 22.5 % — ABNORMAL LOW (ref 34.8–46.6)
HEMOGLOBIN: 7.7 g/dL — AB (ref 11.6–15.9)
LYMPH%: 17.4 % (ref 14.0–49.7)
MCH: 36.7 pg — ABNORMAL HIGH (ref 25.1–34.0)
MCHC: 34.2 g/dL (ref 31.5–36.0)
MCV: 107.1 fL — AB (ref 79.5–101.0)
MONO#: 0.2 10*3/uL (ref 0.1–0.9)
MONO%: 7 % (ref 0.0–14.0)
NEUT%: 69.2 % (ref 38.4–76.8)
NEUTROS ABS: 2.3 10*3/uL (ref 1.5–6.5)
PLATELETS: 125 10*3/uL — AB (ref 145–400)
RBC: 2.1 10*6/uL — ABNORMAL LOW (ref 3.70–5.45)
RDW: 17.3 % — ABNORMAL HIGH (ref 11.2–14.5)
WBC: 3.3 10*3/uL — ABNORMAL LOW (ref 3.9–10.3)
lymph#: 0.6 10*3/uL — ABNORMAL LOW (ref 0.9–3.3)

## 2014-01-26 MED ORDER — CAPECITABINE 500 MG PO TABS: ORAL_TABLET | ORAL | Status: DC

## 2014-01-26 NOTE — Telephone Encounter (Signed)
Pt confirmed labs/ov per 01/15 POF, gave pt AVS.... KJ

## 2014-01-26 NOTE — Progress Notes (Signed)
  [place] OFFICE PROGRESS NOTE   Diagnosis:  Colon cancer  INTERVAL HISTORY:   Natalie Chapman returns as scheduled. She completed cycle 7 Xeloda beginning 01/09/2014. She denies nausea/vomiting. No mouth sores. She had loose stools earlier this week. She attributes the loose stools to an antibiotic for a urinary tract infection. She also had some right-sided abdominal pain which she thinks is related to the urinary tract infection. The pain is improving. No hand or foot pain or redness. Appetite is stable. No shortness of breath or chest pain. She denies any bleeding.  Objective:  Vital signs in last 24 hours:  Blood pressure 115/41, pulse 43, temperature 97 F (36.1 C), temperature source Oral, resp. rate 18, height $RemoveBe'5\' 1"'pbmBoFAIP$  (1.549 m), weight 241 lb 9.6 oz (109.589 kg), SpO2 100 %. repeat heart rate 80    HEENT: No thrush or ulcers. Resp: Lungs clear bilaterally. Cardio: Irregular. GI: Abdomen soft and nontender. No hepatomegaly. Vascular: Edema at the lower legs bilaterally with chronic stasis changes. Skin: Palms without erythema. No skin breakdown.    Lab Results:  Lab Results  Component Value Date   WBC 3.3* 01/26/2014   HGB 7.7* 01/26/2014   HCT 22.5* 01/26/2014   MCV 107.1* 01/26/2014   PLT 125* 01/26/2014   NEUTROABS 2.3 01/26/2014    Imaging:  No results found.  Medications: I have reviewed the patient's current medications.  Assessment/Plan: 1. 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 8 of 15 lymph nodes contained metastatic carcinoma, tumor deposits were present; microsatellite stable.  PET scan 08/24/2013 with mildly hypermetabolic small left axillary and right inguinal nodes--most likely unrelated to colon cancer, no clear evidence of metastatic disease.   Cycle 1 adjuvant Xeloda 09/05/2013  Cycle 2 adjuvant Xeloda 09/26/2013  Cycle 3  adjuvant Xeloda 10/17/2013  Cycle 4 adjuvant Xeloda 11/07/2013  Cycle 5 adjuvant Xeloda 11/28/2013  Cycle 6 adjuvant Xeloda 12/19/2013  Cycle 7 adjuvant Xeloda 01/09/2014 2. Atrial fibrillation 3. Anemia secondary to bleeding from the colon cancer versus renal insufficiency, and Xeloda.   4. History of tobacco use with COPD changes on the CT 04/07/2013  5. Morbid obesity  6. Status post gastric banding surgery  7. Renal insufficiency  8. Anorexia-etiology unclear. Improved.   Disposition: Natalie Chapman appears stable. She has completed 7 cycles of adjuvant Xeloda. Plan to proceed with eighth and final cycle beginning 01/30/2014.  She has persistent anemia. She appears asymptomatic. We will check an erythropoietin level at the time of her next visit.  She will return for labs and a follow-up visit on 02/22/2014. She will contact the office in the interim with any problems.  Plan reviewed with Dr. Benay Spice.  Ned Card ANP/GNP-BC   01/26/2014  3:01 PM

## 2014-01-27 LAB — CEA: CEA: 7.4 ng/mL — ABNORMAL HIGH (ref 0.0–5.0)

## 2014-01-30 ENCOUNTER — Telehealth: Payer: Self-pay | Admitting: *Deleted

## 2014-01-30 NOTE — Telephone Encounter (Signed)
Called pt with CEA results. Mildly elevated, per Dr. Benay Spice. Will repeat next visit. Pt had questions about what CEA results mean. All answered, encouraged her to call office with any questions prior to next visit. She voiced understanding.

## 2014-01-30 NOTE — Telephone Encounter (Signed)
-----   Message from Ladell Pier, MD sent at 01/29/2014  7:02 PM EST ----- Please call patient, Natalie Chapman is mildly elevated, repeat next visit, consider CT if continues to rise

## 2014-02-07 ENCOUNTER — Telehealth: Payer: Self-pay | Admitting: *Deleted

## 2014-02-07 ENCOUNTER — Telehealth: Payer: Self-pay | Admitting: Cardiovascular Disease

## 2014-02-07 NOTE — Telephone Encounter (Signed)
Nw MEssage                                                                                                                                                                                                                 Pt requested to speak w/ rn about future appt and starting a blood thiner regimen. Pt is doing chemo procedures and wants to know what to do going forward. Please call back and discuss

## 2014-02-07 NOTE — Telephone Encounter (Signed)
Message retrieved from daughter Vaughan Basta.  "Mom is upset.  She received a call that a new order for treatment medication has been sent and a new appointment." Called Vaughan Basta to notify her mom per last office visit, mom is currently on the "eighth and final cycle".  This appointment is for Lab, provider visit and any new plans will be discussed if needed.  Vaughan Basta will give her mom this news.

## 2014-02-08 ENCOUNTER — Other Ambulatory Visit: Payer: Self-pay

## 2014-02-08 DIAGNOSIS — D649 Anemia, unspecified: Secondary | ICD-10-CM

## 2014-02-08 DIAGNOSIS — C189 Malignant neoplasm of colon, unspecified: Secondary | ICD-10-CM

## 2014-02-08 NOTE — Telephone Encounter (Signed)
Pt called asking for Xeloda rx to be sent to [facility] and not [facility]. States she received a call from [facility] about the rx.(this seems to have been accidentally sent to this pharmacy on Jan 15th and resent to [place]) States she spoke with a nurse yesterday about her having one more cycle and needs the prescription at [place]. Called pt back, reviewed Suezanne Jacquet last office note. Pt is on her last cycle (8th cycle) Pt to finish Feb 1st and see Dr. Benay Spice Feb 11th. Clarified this information with the patient, she states she started this cycle on Jan 19th (picked the prescription up on Jan 15th at [facility]) Informed pt this was her last cycle.  Patient verbalized all understanding and denies any further concerns or questions at this time.

## 2014-02-12 ENCOUNTER — Encounter (INDEPENDENT_AMBULATORY_CARE_PROVIDER_SITE_OTHER): Payer: Self-pay | Admitting: General Surgery

## 2014-02-12 NOTE — Progress Notes (Signed)
Patient ID: Natalie Chapman, female   DOB: 30-Jul-1940, 74 y.o.   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 The wound nodule is still present. She states it waxes and wanes in size. She continues to tolerate her chemotherapy. Her last treatment is today. Her CEA was elevated tio 7.4 01/26/14. She is eating okay. Bowel are moving.  Allergies Natalie Chapman, [place]; 02/12/2014 3:10 PM) No Known Drug Allergies 11/14/2013  Medication History Natalie Chapman, [place]; 02/12/2014 3:11 PM) Medications Reconciled    Vitals Natalie Costa MA; 02/12/2014 3:09 PM) 02/12/2014 3:09 PM Temp.: 98.18F(Temporal)  Pulse: 80 (Regular)  Resp.: 18 (Unlabored)  BP: 108/70 (Sitting, Left Arm, Standard) unable to obtain weight (patient in wheelchair)    Physical Exam Natalie Hollingshead MD; 02/12/2014 3:47 PM)  The physical exam findings are as follows: Note:General-obese female in NAD in wheel chair; very pleasant.  Abd: soft, right sided indented wound with firm area in the middle that is unchanged.    Assessment & Plan Natalie Hollingshead MD; 02/12/2014 3:49 PM)  MALIGNANT NEOPLASM OF ASCENDING COLON (153.6  C18.2) Impression: Stage III disease. CEA rising. Completing chemotherapy. CEA to be repeated by Dr. Benay Chapman later this month. No change in wound nodule by exam.  Plan: RTC in 6 weeks or sooner if needed. Want to see what wound nodule does off chemotherapy. May need a needle biopsy.  Current Plans Free Text Instructions : discussed with patient and provided information. Follow up in 6 weeks or as needed  Natalie Confer, MD

## 2014-02-22 ENCOUNTER — Other Ambulatory Visit (HOSPITAL_BASED_OUTPATIENT_CLINIC_OR_DEPARTMENT_OTHER): Payer: Medicare Other

## 2014-02-22 ENCOUNTER — Ambulatory Visit (HOSPITAL_BASED_OUTPATIENT_CLINIC_OR_DEPARTMENT_OTHER): Payer: Medicare Other | Admitting: Oncology

## 2014-02-22 ENCOUNTER — Telehealth: Payer: Self-pay | Admitting: Oncology

## 2014-02-22 ENCOUNTER — Other Ambulatory Visit: Payer: Self-pay | Admitting: *Deleted

## 2014-02-22 VITALS — BP 102/55 | HR 63 | Temp 97.7°F | Resp 18 | Ht 60.0 in | Wt 236.1 lb

## 2014-02-22 DIAGNOSIS — C189 Malignant neoplasm of colon, unspecified: Secondary | ICD-10-CM

## 2014-02-22 DIAGNOSIS — N289 Disorder of kidney and ureter, unspecified: Secondary | ICD-10-CM

## 2014-02-22 DIAGNOSIS — C182 Malignant neoplasm of ascending colon: Secondary | ICD-10-CM

## 2014-02-22 DIAGNOSIS — I4891 Unspecified atrial fibrillation: Secondary | ICD-10-CM

## 2014-02-22 DIAGNOSIS — C787 Secondary malignant neoplasm of liver and intrahepatic bile duct: Secondary | ICD-10-CM

## 2014-02-22 LAB — CBC WITH DIFFERENTIAL/PLATELET
BASO%: 1.1 % (ref 0.0–2.0)
Basophils Absolute: 0 10*3/uL (ref 0.0–0.1)
EOS%: 3.4 % (ref 0.0–7.0)
Eosinophils Absolute: 0.1 10*3/uL (ref 0.0–0.5)
HCT: 28.1 % — ABNORMAL LOW (ref 34.8–46.6)
HGB: 9.2 g/dL — ABNORMAL LOW (ref 11.6–15.9)
LYMPH#: 0.7 10*3/uL — AB (ref 0.9–3.3)
LYMPH%: 18.9 % (ref 14.0–49.7)
MCH: 35.6 pg — AB (ref 25.1–34.0)
MCHC: 32.6 g/dL (ref 31.5–36.0)
MCV: 109.3 fL — AB (ref 79.5–101.0)
MONO#: 0.2 10*3/uL (ref 0.1–0.9)
MONO%: 6.4 % (ref 0.0–14.0)
NEUT%: 70.2 % (ref 38.4–76.8)
NEUTROS ABS: 2.5 10*3/uL (ref 1.5–6.5)
Platelets: 248 10*3/uL (ref 145–400)
RBC: 2.57 10*6/uL — AB (ref 3.70–5.45)
RDW: 20.3 % — AB (ref 11.2–14.5)
WBC: 3.6 10*3/uL — ABNORMAL LOW (ref 3.9–10.3)

## 2014-02-22 LAB — HOLD TUBE, BLOOD BANK

## 2014-02-22 LAB — CEA: CEA: 7.8 ng/mL — ABNORMAL HIGH (ref 0.0–5.0)

## 2014-02-22 NOTE — Progress Notes (Signed)
Pt requests office note to be sent to Dr. Fredderick Phenix.

## 2014-02-22 NOTE — Telephone Encounter (Signed)
Gave avs & calendar for [facility].

## 2014-02-22 NOTE — Progress Notes (Signed)
  [facility] OFFICE PROGRESS NOTE   Diagnosis: Colon cancer  INTERVAL HISTORY:   Ms. Natalie Chapman returns as scheduled. She completed a final cycle of adjuvant Xeloda beginning 01/30/2014. No mouth sores, diarrhea, or hand/foot pain. She saw Dr. Zella Richer and is being scheduled for a biopsy of the nodularity associated with the right abdomen scar. No bleeding.  Objective:  Vital signs in last 24 hours:  Blood pressure 102/55, pulse 63, temperature 97.7 F (36.5 C), temperature source Oral, resp. rate 18, height 5' (1.524 m), weight 236 lb 1.6 oz (107.094 kg), SpO2 100 %.    HEENT: Neck without mass Lymphatics: No cervical, supra-clavicular, axillary, or inguinal nodes Resp: Decreased breath sounds at the lower chest, no respiratory distress Cardio: Irregular GI: No hepatomegaly, firm nodular tissue at the needed and lateral portion of the right abdominal scar  Skin: Mild erythema at the soles, no skin breakdown     Lab Results:  Lab Results  Component Value Date   WBC 3.6* 02/22/2014   HGB 9.2* 02/22/2014   HCT 28.1* 02/22/2014   MCV 109.3* 02/22/2014   PLT 248 02/22/2014   NEUTROABS 2.5 02/22/2014      Lab Results  Component Value Date   CEA 7.4* 01/26/2014     Medications: I have reviewed the patient's current medications.  Assessment/Plan: 1. 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 8 of 15 lymph nodes contained metastatic carcinoma, tumor deposits were present; microsatellite stable.  PET scan 08/24/2013 with mildly hypermetabolic small left axillary and right inguinal nodes--most likely unrelated to colon cancer, no clear evidence of metastatic disease.   Cycle 1 adjuvant Xeloda 09/05/2013  Cycle 2 adjuvant Xeloda 09/26/2013  Cycle 3 adjuvant Xeloda 10/17/2013  Cycle 4 adjuvant Xeloda 11/07/2013  Cycle 5 adjuvant Xeloda 11/28/2013  Cycle  6 adjuvant Xeloda 12/19/2013  Cycle 7 adjuvant Xeloda 01/09/2014  Cycle 8 adjuvant Xeloda 01/30/2014 2. Atrial fibrillation 3. Anemia secondary to bleeding from the colon cancer versus renal insufficiency, and Xeloda. Improved. 4. History of tobacco use with COPD changes on the CT 04/07/2013  5. Morbid obesity  6. Status post gastric banding surgery  7. Renal insufficiency  8. Anorexia-etiology unclear. Improved.   Disposition:  She has completed the course of adjuvant Xeloda. The anemia may be related to Xeloda or renal insufficiency. The hemoglobin is improved today. We check an erythropoietin level today. The CEA was mildly elevated when she was here last month. We will repeat a CEA level today and consider obtaining CT scans for a rise in the CEA. She is scheduled for a biopsy by Dr. Zella Richer next month. She will return for an office visit here after the biopsy.  Betsy Coder, MD  02/22/2014  3:34 PM

## 2014-02-23 ENCOUNTER — Telehealth: Payer: Self-pay | Admitting: *Deleted

## 2014-02-23 NOTE — Telephone Encounter (Signed)
-----   Message from Ladell Pier, MD sent at 02/23/2014  1:46 PM EST ----- Please call patient , cea is stable, add cea next visit, f/u for biopsy with Dr. Zella Richer

## 2014-02-23 NOTE — Telephone Encounter (Signed)
Called pt with CEA results. Stable, per Dr. Benay Spice. Will recheck next visit. Pt to follow up with Dr. Zella Richer for biopsy. She voiced understanding.

## 2014-02-26 ENCOUNTER — Telehealth: Payer: Self-pay | Admitting: *Deleted

## 2014-02-26 DIAGNOSIS — C189 Malignant neoplasm of colon, unspecified: Secondary | ICD-10-CM

## 2014-02-26 LAB — ERYTHROPOIETIN: Erythropoietin: 23.7 m[IU]/mL — ABNORMAL HIGH (ref 2.6–18.5)

## 2014-02-26 NOTE — Telephone Encounter (Signed)
Per Dr. Benay Spice; notified pt that cea is stable, f/u with biopsy with Dr. Zella Richer.  Pt verbalized understanding and states she has appt with [facility] 03/27/14.  Confirmed appt with Dr. Benay Spice on 04/06/14.

## 2014-02-26 NOTE — Telephone Encounter (Signed)
-----   Message from Ladell Pier, MD sent at 02/23/2014  1:46 PM EST ----- Please call patient , cea is stable, add cea next visit, f/u for biopsy with Dr. Zella Richer

## 2014-03-22 NOTE — Progress Notes (Signed)
Patient ID: Natalie Chapman, female   DOB: 11-Nov-1940, 74 y.o.   MRN: [id number] 74 y.o. obese female referred by Dr Zella Richer for preop clearance in May 2015   She has no history of CHF, CAD but has afib.  Was started on xarelto and had GI bleed Led to discovery of stage 3 colon CA  Had hemicolectomy She completed a final cycle of adjuvant Xeloda beginning 01/30/2014 Is supporsed to have a biopsy with Dr Zella Richer Soon.  Seeing him 3/15    Poor functional status In wheelchair a lot.  No chest pain dyspnea or palpitations Rate control is good.  Recovered from surgery well  Echo 06/20/13 reviewed and has severe LAE Study Conclusions  - Left ventricle: The cavity size was normal. Wall thickness was normal. Systolic function was normal. The estimated ejection fraction was in the range of 55% to 60%. - Aortic valve: Possible lamble&'s excresense. - Mitral valve: There was mild regurgitation. - Left atrium: The atrium was severely dilated. - Atrial septum: No defect or patent foramen ovale was identified. - Tricuspid valve: There was moderate regurgitation.  Has been off diuretics and xarlto  She need to be back on fluid pill  Cr 1.3 in January    ROS: Denies fever, malais, weight loss, blurry vision, decreased visual acuity, cough, sputum, SOB, hemoptysis, pleuritic pain, palpitaitons, heartburn, abdominal pain, melena, lower extremity edema, claudication, or rash.  All other systems reviewed and negative  General: Pale  Obese white female  HEENT: normal Neck supple with no adenopathy JVP normal no bruits no thyromegaly Lungs clear with no wheezing and good diaphragmatic motion Heart:  S1/S2 SEM  murmur, no rub, gallop or click PMI normal Abdomen: benighn, BS positve, no tenderness, no AAA no bruit.  No HSM or HJR Distal pulses intact with no bruits Plus 3 LE lymphedema Neuro non-focal Skin warm and dry No muscular weakness   Current Outpatient Prescriptions  Medication Sig  Dispense Refill  . acetaminophen (TYLENOL) 650 MG CR tablet Take 1,300 mg by mouth 2 (two) times daily as needed for pain.     Marland Kitchen aspirin EC 81 MG tablet Take 81 mg by mouth daily.    Marland Kitchen atorvastatin (LIPITOR) 10 MG tablet Take 5 mg by mouth daily with breakfast.     . Cholecalciferol (VITAMIN D3) 400 UNITS tablet Take 800 Units by mouth daily.    Marland Kitchen docusate sodium (COLACE) 100 MG capsule Take 100 mg by mouth daily as needed for mild constipation.    . enalapril (VASOTEC) 2.5 MG tablet Take 2.5 mg by mouth daily.     . ferrous sulfate 325 (65 FE) MG tablet Take 325 mg by mouth 2 (two) times daily with a meal.     . metoprolol succinate (TOPROL-XL) 25 MG 24 hr tablet Take 25 mg by mouth daily.     . polyethylene glycol (MIRALAX / GLYCOLAX) packet Take 17 g by mouth daily.    Marland Kitchen VITAMIN A PO Take 1 tablet by mouth daily.    . Zinc Sulfate (ZINC 15 PO) Take 1 tablet by mouth daily.     No current facility-administered medications for this visit.    Allergies  Review of patient's allergies indicates no known allergies.  Electrocardiogram:   08/16/13  afib rate 84 nonspecific ST changes    Assessment and Plan

## 2014-03-23 ENCOUNTER — Encounter: Payer: Self-pay | Admitting: Cardiovascular Disease

## 2014-03-23 ENCOUNTER — Ambulatory Visit (INDEPENDENT_AMBULATORY_CARE_PROVIDER_SITE_OTHER): Payer: Medicare Other | Admitting: Cardiovascular Disease

## 2014-03-23 VITALS — BP 108/62 | HR 80 | Ht 60.0 in | Wt 240.0 lb

## 2014-03-23 DIAGNOSIS — C189 Malignant neoplasm of colon, unspecified: Secondary | ICD-10-CM

## 2014-03-23 MED ORDER — FUROSEMIDE 20 MG PO TABS: 20.0000 mg | ORAL_TABLET | Freq: Every day | ORAL | Status: DC

## 2014-03-23 NOTE — Assessment & Plan Note (Signed)
Oncologist Dr Penni Homans  Having biopsy of soft tissue sight after colectomy due to nodular density.  Needs to restart xarelto after biopsy at discretion of surgeon

## 2014-03-23 NOTE — Assessment & Plan Note (Signed)
With LE lymphedema  Restart lasix 20 mg  F/u primary in [place] Dr Fredderick Phenix for BMET and dosage adjustment

## 2014-03-23 NOTE — Patient Instructions (Addendum)
Your physician wants you to follow-up in:   [place] UP  ON KIDNEY  FUNCTIONS  AND  ADJUST   FUROSEMIDE  ACCORDINGLY  ALSO  AFTER  BIOPSY  PT  NEEDS  TO RESTART   XARELTO  You will receive a reminder letter in the mail two months in advance. If you don't receive a letter, please call our office to schedule the follow-up appointment. Your physician has recommended you make the following change in your medication:  START  FUROSEMIDE   20 MG   EVERY DAY

## 2014-03-23 NOTE — Assessment & Plan Note (Signed)
Rate is fine told her to ask Dr Zella Richer when she can restart xarelto after biopsy.  Will forward note to him

## 2014-03-23 NOTE — Telephone Encounter (Signed)
SEE OFFICE  NOTE  FROM   03-23-14 ./CY

## 2014-03-27 ENCOUNTER — Other Ambulatory Visit: Payer: Self-pay | Admitting: General Surgery

## 2014-03-30 ENCOUNTER — Other Ambulatory Visit: Payer: Self-pay | Admitting: General Surgery

## 2014-03-30 ENCOUNTER — Encounter: Payer: Self-pay | Admitting: General Surgery

## 2014-03-30 DIAGNOSIS — C189 Malignant neoplasm of colon, unspecified: Secondary | ICD-10-CM

## 2014-03-30 NOTE — Progress Notes (Unsigned)
Patient ID: Natalie Chapman, female   DOB: November 10, 1940, 74 y.o.   MRN: [id number] Biopsy of the right mid abdominal wall nodule is consistent with adenocarcinoma. I discussed this with her. We'll plan on doing a CT scan of the abdomen and pelvis to look at the extent of this disease. She may need excision of the area versus radiation depending on the extent. We'll discuss the results with her after the CT scan is done.

## 2014-04-01 NOTE — Addendum Note (Signed)
Addended by: Odis Hollingshead on: 04/01/2014 07:21 PM   Modules accepted: Orders

## 2014-04-02 ENCOUNTER — Other Ambulatory Visit: Payer: Self-pay

## 2014-04-02 DIAGNOSIS — C189 Malignant neoplasm of colon, unspecified: Secondary | ICD-10-CM

## 2014-04-05 ENCOUNTER — Ambulatory Visit
Admission: RE | Admit: 2014-04-05 | Discharge: 2014-04-05 | Disposition: A | Discharge status: Home / Self Care | Payer: Medicare Other | Source: Ambulatory Visit | Attending: General Surgery | Admitting: General Surgery

## 2014-04-06 ENCOUNTER — Ambulatory Visit (HOSPITAL_BASED_OUTPATIENT_CLINIC_OR_DEPARTMENT_OTHER): Payer: Medicare Other | Admitting: Oncology

## 2014-04-06 ENCOUNTER — Encounter: Payer: Self-pay | Admitting: *Deleted

## 2014-04-06 ENCOUNTER — Other Ambulatory Visit (HOSPITAL_BASED_OUTPATIENT_CLINIC_OR_DEPARTMENT_OTHER): Payer: Medicare Other

## 2014-04-06 ENCOUNTER — Telehealth: Payer: Self-pay | Admitting: Oncology

## 2014-04-06 VITALS — BP 128/64 | HR 80 | Temp 97.8°F | Resp 18 | Ht 60.0 in | Wt 233.8 lb

## 2014-04-06 DIAGNOSIS — C189 Malignant neoplasm of colon, unspecified: Secondary | ICD-10-CM

## 2014-04-06 DIAGNOSIS — C182 Malignant neoplasm of ascending colon: Secondary | ICD-10-CM

## 2014-04-06 DIAGNOSIS — N289 Disorder of kidney and ureter, unspecified: Secondary | ICD-10-CM

## 2014-04-06 DIAGNOSIS — N1330 Unspecified hydronephrosis: Secondary | ICD-10-CM

## 2014-04-06 DIAGNOSIS — D649 Anemia, unspecified: Secondary | ICD-10-CM | POA: Diagnosis not present

## 2014-04-06 DIAGNOSIS — R630 Anorexia: Secondary | ICD-10-CM

## 2014-04-06 DIAGNOSIS — I4891 Unspecified atrial fibrillation: Secondary | ICD-10-CM

## 2014-04-06 LAB — CBC WITH DIFFERENTIAL/PLATELET
BASO%: 0.6 % (ref 0.0–2.0)
Basophils Absolute: 0 10*3/uL (ref 0.0–0.1)
EOS ABS: 0.2 10*3/uL (ref 0.0–0.5)
EOS%: 4.3 % (ref 0.0–7.0)
HCT: 29.2 % — ABNORMAL LOW (ref 34.8–46.6)
HGB: 9.3 g/dL — ABNORMAL LOW (ref 11.6–15.9)
LYMPH%: 12 % — ABNORMAL LOW (ref 14.0–49.7)
MCH: 31.2 pg (ref 25.1–34.0)
MCHC: 31.8 g/dL (ref 31.5–36.0)
MCV: 98.1 fL (ref 79.5–101.0)
MONO#: 0.3 10*3/uL (ref 0.1–0.9)
MONO%: 7.4 % (ref 0.0–14.0)
NEUT#: 3.5 10*3/uL (ref 1.5–6.5)
NEUT%: 75.7 % (ref 38.4–76.8)
Platelets: 197 10*3/uL (ref 145–400)
RBC: 2.98 10*6/uL — ABNORMAL LOW (ref 3.70–5.45)
RDW: 13.5 % (ref 11.2–14.5)
WBC: 4.6 10*3/uL (ref 3.9–10.3)
lymph#: 0.5 10*3/uL — ABNORMAL LOW (ref 0.9–3.3)

## 2014-04-06 LAB — CEA: CEA: 11.9 ng/mL — ABNORMAL HIGH (ref 0.0–5.0)

## 2014-04-06 NOTE — Progress Notes (Signed)
[facility]atalie [facility] OFFICE PROGRESS NOTE   Diagnosis: Colon cancer  INTERVAL HISTORY:   Natalie Chapman returns for scheduled visit. No new complaint. She underwent a biopsy of the right abdominal wall mass by Dr. Zella Richer on 03/27/2014. The pathology confirmed metastatic adenocarcinoma consistent with colon cancer.  Should when a CT of the abdomen and pelvis 04/05/2014. This revealed new mild right hydronephrosis. Soft tissue. No noted at the lateral aspect of the surgical site with overlying subcutaneous soft tissue nodularity.  Objective:  Vital signs in last 24 hours:  Blood pressure 128/64, pulse 80, temperature 97.8 F (36.6 C), temperature source Oral, resp. rate 18, height 5' (1.524 m), weight 233 lb 12.8 oz (106.051 kg), SpO2 100 %.    HEENT: Neck without mass Lymphatics: No cervical, supra-clavicular, axillary, or inguinal nodes Resp: Lungs clear bilaterally Cardio: Regular rate and rhythm GI: No hepatomegaly, firm nodularity deep to the right lower quadrant surgical scar. Vascular: Chronic stasis change with edema at the low leg bilaterally  Skin: Blister with clear fluid at the right tibia   Portacath/PICC-without erythema  Lab Results:  Lab Results  Component Value Date   WBC 4.6 04/06/2014   HGB 9.3* 04/06/2014   HCT 29.2* 04/06/2014   MCV 98.1 04/06/2014   PLT 197 04/06/2014   NEUTROABS 3.5 04/06/2014      Lab Results  Component Value Date   CEA 7.8* 02/22/2014    Imaging:  Ct Abdomen Pelvis Wo Contrast  04/05/2014   CLINICAL DATA:  Right-sided abdominal pain. Cholecystectomy. Incisional hernia repair. Gastric stapling. Tubal ligation. Right colectomy for colon cancer with recurrence at abdominal wall. Needle biopsy 1 week ago.  EXAM: CT ABDOMEN AND PELVIS WITHOUT CONTRAST  TECHNIQUE: Multidetector CT imaging of the abdomen and pelvis was performed following the standard protocol without IV contrast.  COMPARISON:  PET of 08/24/2013.  Diagnostic  CT of 04/07/2013.  FINDINGS: Lower chest: Mild mosaic attenuation within the lung bases with a suggestion of architectural distortion, greater on the left. Similar to 08/24/2013. Moderate cardiomegaly with multivessel coronary artery atherosclerosis.  Exam is moderately degraded. Patient body habitus, lack of IV contrast, and beam hardening artifact from extensive surgical changes in the left upper quadrant.  Hepatobiliary: Hepatomegaly, without dominant liver lesion. Cholecystectomy, without biliary ductal dilatation.  Pancreas: Grossly within normal limits.  Spleen: Old granulomatous disease within.  Adrenals/Urinary Tract: Left adrenal not well visualized. Normal right adrenal gland. Renal cortical thinning bilaterally. Left extrarenal pelvis. Mild right-sided hydronephrosis is identified. The right ureter is difficult to follow. Calcifications within the right central pelvis on image 70 felt to be outside of the urinary bladder.  Stomach/Bowel: Surgical changes about the stomach with beam hardening artifact. No distal gastric abnormality identified. Status post right hemicolectomy. Soft tissue fullness about the lateral aspect of the surgical site is again identified, including on image 52 of series 3. No complicating obstruction. Normal small bowel caliber.  Vascular/Lymphatic: Advanced aortic and branch vessel atherosclerosis. No gross retroperitoneal adenopathy within the abdomen. Limited evaluation for pelvic adenopathy. Calcified nodes in the right inguinal region are unchanged and favored to be post infectious or inflammatory.  Reproductive: Probable uterine atrophy.  No gross adnexal mass.  Other: No significant free fluid.  Similar subcutaneous thickening within the pelvic pannus. No abscess. Soft tissue fullness about the right pelvic wall anteriorly on image 53. Grossly similar to the 08/24/2013 PET.  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 Lumbosacral spondylosis.   IMPRESSION: 1. Moderately degraded exam,  as detailed above. 2. Development of mild right-sided hydronephrosis. No definite cause identified. The right ureter is difficult to follow. Correlate with urinalysis to exclude distal ureteric stone. No dominant obstructive mass identified. 3. No other explanation for right-sided pain. 4. Soft tissue thickening adjacent the ileocolic anastomosis with overlying subcutaneous soft tissue nodularity. Given the clinical history, this is suspicious for locally recurrent disease. 5. Interstitial lung disease at the bases, similar to on the prior PET. Please see that report. 6. Hepatomegaly.   Electronically Signed   By: Abigail Miyamoto M.D.   On: 04/05/2014 15:56   CT images reviewed with Natalie Chapman and her family. Medications: I have reviewed the patient's current medications.  Assessment/Plan: 1. 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 8 of 15 lymph nodes contained metastatic carcinoma, tumor deposits were present; microsatellite stable.  PET scan 08/24/2013 with mildly hypermetabolic small left axillary and right inguinal nodes--most likely unrelated to colon cancer, no clear evidence of metastatic disease.   Cycle 1 adjuvant Xeloda 09/05/2013  Cycle 2 adjuvant Xeloda 09/26/2013  Cycle 3 adjuvant Xeloda 10/17/2013  Cycle 4 adjuvant Xeloda 11/07/2013  Cycle 5 adjuvant Xeloda 11/28/2013  Cycle 6 adjuvant Xeloda 12/19/2013  Cycle 7 adjuvant Xeloda 01/09/2014  Cycle 8 adjuvant Xeloda 01/30/2014  Status post biopsy of nodularity at the right lower quadrant scar on 03/27/2014 confirming metastatic colon cancer  CT 04/05/2014 with new right hydronephrosis, soft tissue thickening adjacent to the ileocolonic anastomosis and nodularity in the overlying subcutaneous tissue 2. Atrial fibrillation 3. Anemia secondary to bleeding from the colon cancer versus renal  insufficiency, and Xeloda. Improved. 4. History of tobacco use with COPD changes on the CT 04/07/2013  5. Morbid obesity  6. Status post gastric banding surgery  7. Renal insufficiency  8. Anorexia-etiology unclear. Improved 9. Right hydronephrosis on the CT 04/05/2014   Disposition:  Natalie Chapman appears unchanged. She completed the adjuvant course of Xeloda. The CEA is mildly elevated and a biopsy of the firm area at the right lower quadrant scar is consistent with recurrent colon cancer. Review of the imaging studies is consistent with local tumor recurrence. I will present her case at the GI tumor conference next week and discussed the case with Dr. Zella Richer. I doubt she will be a candidate for excision of these areas. I briefly discussed systemic therapy options with Natalie Chapman and her family. She will return for an office visit and further discussion in 2 weeks. She will be referred to urology to evaluate the right hydronephrosis and consider placement of a ureter stent.  Betsy Coder, MD  04/06/2014  9:55 AM

## 2014-04-06 NOTE — Telephone Encounter (Signed)
Gave avs & calendar for April. °

## 2014-04-06 NOTE — [facility] Oncology Navigator Note (Signed)
Faxed referral request and records to [facility] for "urgent" referral. Right hydronephrosis, evaluate for stent

## 2014-04-06 NOTE — [facility] Oncology Navigator Note (Signed)
Met with patient and her children during follow up visit with Dr. Benay Spice. Provided contact information and role of the GI Oncology Nurse Navigator. Requested they call for any questions or if they have not heard from urology practice by early next week and I will follow up.

## 2014-04-09 ENCOUNTER — Telehealth: Payer: Self-pay | Admitting: Oncology

## 2014-04-09 NOTE — Telephone Encounter (Signed)
Faxed pt medical records to [facility] °

## 2014-04-10 ENCOUNTER — Telehealth: Payer: Self-pay | Admitting: *Deleted

## 2014-04-10 NOTE — Telephone Encounter (Signed)
[facility] to follow up on status of appointment. Was informed she will see Dr. Jacinto Reap. Herrick on 04/19/14 at 3:30pm. Confirmed with Elaina she is aware of the appointment.

## 2014-04-11 ENCOUNTER — Other Ambulatory Visit (HOSPITAL_COMMUNITY)
Admission: RE | Admit: 2014-04-11 | Discharge: 2014-04-11 | Disposition: A | Discharge status: Home / Self Care | Payer: Medicare Other | Source: Ambulatory Visit | Attending: Oncology | Admitting: Oncology

## 2014-04-11 DIAGNOSIS — C189 Malignant neoplasm of colon, unspecified: Secondary | ICD-10-CM | POA: Diagnosis present

## 2014-04-16 ENCOUNTER — Telehealth: Payer: Self-pay | Admitting: *Deleted

## 2014-04-16 ENCOUNTER — Encounter (HOSPITAL_COMMUNITY): Payer: Self-pay

## 2014-04-16 DIAGNOSIS — C189 Malignant neoplasm of colon, unspecified: Secondary | ICD-10-CM

## 2014-04-16 NOTE — Telephone Encounter (Signed)
Notified patient that GI Tumor Board has suggested a repeat PET scan and colonoscopy. She is agreeable to PET scan (needs to be in afternoon), but wants to hold off on the colonoscopy referral and talk with Dr. Benay Spice further on 04/23/14.

## 2014-04-17 ENCOUNTER — Telehealth: Payer: Self-pay | Admitting: Oncology

## 2014-04-17 NOTE — Telephone Encounter (Signed)
Faxed pt medical records to [facility] [phone]

## 2014-04-23 ENCOUNTER — Other Ambulatory Visit: Payer: Self-pay | Admitting: Urology

## 2014-04-23 ENCOUNTER — Other Ambulatory Visit (HOSPITAL_BASED_OUTPATIENT_CLINIC_OR_DEPARTMENT_OTHER): Payer: Medicare Other

## 2014-04-23 ENCOUNTER — Telehealth: Payer: Self-pay | Admitting: Oncology

## 2014-04-23 ENCOUNTER — Ambulatory Visit (HOSPITAL_BASED_OUTPATIENT_CLINIC_OR_DEPARTMENT_OTHER): Payer: Medicare Other | Admitting: Nurse Practitioner

## 2014-04-23 ENCOUNTER — Other Ambulatory Visit: Payer: Self-pay | Admitting: Oncology

## 2014-04-23 VITALS — BP 122/76 | HR 69 | Temp 97.7°F | Resp 18 | Ht 60.0 in | Wt 230.9 lb

## 2014-04-23 DIAGNOSIS — E6609 Other obesity due to excess calories: Secondary | ICD-10-CM | POA: Diagnosis not present

## 2014-04-23 DIAGNOSIS — I4891 Unspecified atrial fibrillation: Secondary | ICD-10-CM | POA: Diagnosis not present

## 2014-04-23 DIAGNOSIS — N1330 Unspecified hydronephrosis: Secondary | ICD-10-CM

## 2014-04-23 DIAGNOSIS — N289 Disorder of kidney and ureter, unspecified: Secondary | ICD-10-CM

## 2014-04-23 DIAGNOSIS — C779 Secondary and unspecified malignant neoplasm of lymph node, unspecified: Secondary | ICD-10-CM

## 2014-04-23 DIAGNOSIS — D500 Iron deficiency anemia secondary to blood loss (chronic): Secondary | ICD-10-CM

## 2014-04-23 DIAGNOSIS — C189 Malignant neoplasm of colon, unspecified: Secondary | ICD-10-CM

## 2014-04-23 DIAGNOSIS — R599 Enlarged lymph nodes, unspecified: Secondary | ICD-10-CM | POA: Diagnosis not present

## 2014-04-23 DIAGNOSIS — C182 Malignant neoplasm of ascending colon: Secondary | ICD-10-CM | POA: Diagnosis present

## 2014-04-23 LAB — CBC WITH DIFFERENTIAL/PLATELET
BASO%: 0.8 % (ref 0.0–2.0)
Basophils Absolute: 0 10*3/uL (ref 0.0–0.1)
EOS%: 4.7 % (ref 0.0–7.0)
Eosinophils Absolute: 0.2 10*3/uL (ref 0.0–0.5)
HCT: 28.8 % — ABNORMAL LOW (ref 34.8–46.6)
HEMOGLOBIN: 9.4 g/dL — AB (ref 11.6–15.9)
LYMPH%: 16.1 % (ref 14.0–49.7)
MCH: 31.3 pg (ref 25.1–34.0)
MCHC: 32.6 g/dL (ref 31.5–36.0)
MCV: 96 fL (ref 79.5–101.0)
MONO#: 0.3 10*3/uL (ref 0.1–0.9)
MONO%: 7.2 % (ref 0.0–14.0)
NEUT%: 71.2 % (ref 38.4–76.8)
NEUTROS ABS: 3.2 10*3/uL (ref 1.5–6.5)
Platelets: 269 10*3/uL (ref 145–400)
RBC: 3 10*6/uL — ABNORMAL LOW (ref 3.70–5.45)
RDW: 13.6 % (ref 11.2–14.5)
WBC: 4.4 10*3/uL (ref 3.9–10.3)
lymph#: 0.7 10*3/uL — ABNORMAL LOW (ref 0.9–3.3)

## 2014-04-23 LAB — BASIC METABOLIC PANEL (CC13)
ANION GAP: 9 meq/L (ref 3–11)
BUN: 42.2 mg/dL — ABNORMAL HIGH (ref 7.0–26.0)
CO2: 24 mEq/L (ref 22–29)
Calcium: 9.4 mg/dL (ref 8.4–10.4)
Chloride: 105 mEq/L (ref 98–109)
Creatinine: 2.3 mg/dL — ABNORMAL HIGH (ref 0.6–1.1)
EGFR: 20 mL/min/{1.73_m2} — ABNORMAL LOW (ref >90)
GLUCOSE: 100 mg/dL (ref 70–140)
POTASSIUM: 5.7 meq/L — AB (ref 3.5–5.1)
Sodium: 138 mEq/L (ref 136–145)

## 2014-04-23 MED ORDER — SODIUM POLYSTYRENE SULFONATE 15 GM/60ML PO SUSP: ORAL | Status: DC

## 2014-04-23 NOTE — Progress Notes (Addendum)
[facility] OFFICE PROGRESS NOTE   Diagnosis:  Colon cancer  INTERVAL HISTORY:   Natalie Chapman returns as scheduled. She feels "bloated" after eating. She denies nausea/vomiting. Bowels moving regularly. She coughs periodically. No shortness of breath or fever. Leg swelling is better since resuming diuretic.  Objective:  Vital signs in last 24 hours:  Blood pressure 122/76, pulse 69, temperature 97.7 F (36.5 C), temperature source Oral, resp. rate 18, height 5' (1.524 m), weight 230 lb 14.4 oz (104.736 kg), SpO2 100 %.    HEENT: No thrush or ulcers. Resp: Lungs clear bilaterally. Cardio: Irregular. GI: Firm nodularity deep to the right lower quadrant surgical scar. Vascular: Lower leg edema bilaterally. Chronic stasis changes.   Lab Results:  Lab Results  Component Value Date   WBC 4.4 04/23/2014   HGB 9.4* 04/23/2014   HCT 28.8* 04/23/2014   MCV 96.0 04/23/2014   PLT 269 04/23/2014   NEUTROABS 3.2 04/23/2014    Imaging:  No results found.  Medications: I have reviewed the patient's current medications.  Assessment/Plan: 1. 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 8 of 15 lymph nodes contained metastatic carcinoma, tumor deposits were present; microsatellite stable.  PET scan 08/24/2013 with mildly hypermetabolic small left axillary and right inguinal nodes--most likely unrelated to colon cancer, no clear evidence of metastatic disease.   Cycle 1 adjuvant Xeloda 09/05/2013  Cycle 2 adjuvant Xeloda 09/26/2013  Cycle 3 adjuvant Xeloda 10/17/2013  Cycle 4 adjuvant Xeloda 11/07/2013  Cycle 5 adjuvant Xeloda 11/28/2013  Cycle 6 adjuvant Xeloda 12/19/2013  Cycle 7 adjuvant Xeloda 01/09/2014  Cycle 8 adjuvant Xeloda 01/30/2014  Status post biopsy of nodularity at the right lower quadrant scar on 03/27/2014 confirming metastatic colon  cancer  CT 04/05/2014 with new right hydronephrosis, soft tissue thickening adjacent to the ileocolonic anastomosis and nodularity in the overlying subcutaneous tissue 2. Atrial fibrillation 3. Anemia secondary to bleeding from the colon cancer versus renal insufficiency, and Xeloda. Improved. 4. History of tobacco use with COPD changes on the CT 04/07/2013  5. Morbid obesity  6. Status post gastric banding surgery  7. Renal insufficiency  8. Anorexia-etiology unclear. Improved 9. Right hydronephrosis on the CT 04/05/2014. She was referred to urology.   Disposition: Dr. Benay Spice presented her case at the recent GI tumor conference. The consensus was to proceed with a PET scan and colonoscopy. She would like to go ahead with the PET scan first and then will have the colonoscopy if necessary. The PET scan is scheduled on 05/02/2014.  She has seen Dr. Louis Meckel regarding the right hydronephrosis. Labs today show worsening renal function and hyperkalemia. Dr. Benay Spice spoke with Dr. Louis Meckel. She will be scheduled for stent placement later this week. She will take Kayexalate 30 g now, repeat at bedtime and tomorrow morning. She will return for a repeat basic metabolic panel on 66/44/0347 a.m. We instruction her to discontinue furosemide and enalapril.  We will see her in follow-up on 05/03/2014 to review the results of the PET scan.  Patient seen with Dr. Benay Spice. 25 minutes were spent face-to-face at today's visit with the majority of that time involved in counseling/coordination of care.    Ned Card ANP/GNP-BC   04/23/2014  2:41 PM   This was a shared visit with Ned Card. Natalie Chapman was interviewed and examined. She has developed renal insufficiency and hyperkalemia. I discussed the case with urology. She will undergo a cystoscopy/ureterogram's  this week. She was given Kayexalate for the hyperkalemia and the medical regimen was adjusted.  Natalie Chapman will undergo a staging PET  scan and return for an office visit next week. I doubt she will be a candidate for resection of the locally recurrent tumor. I suspect she has disease in the pelvis causing hydronephrosis.  Julieanne Manson, M.D.

## 2014-04-23 NOTE — Telephone Encounter (Signed)
Gave patient avs report and appointments for April  °

## 2014-04-23 NOTE — Patient Instructions (Signed)
STOP LASIX AND ENALAPRIL

## 2014-04-24 ENCOUNTER — Telehealth: Payer: Self-pay

## 2014-04-24 ENCOUNTER — Other Ambulatory Visit (HOSPITAL_BASED_OUTPATIENT_CLINIC_OR_DEPARTMENT_OTHER): Payer: Medicare Other

## 2014-04-24 DIAGNOSIS — C182 Malignant neoplasm of ascending colon: Secondary | ICD-10-CM | POA: Diagnosis present

## 2014-04-24 DIAGNOSIS — C779 Secondary and unspecified malignant neoplasm of lymph node, unspecified: Secondary | ICD-10-CM | POA: Diagnosis not present

## 2014-04-24 DIAGNOSIS — C189 Malignant neoplasm of colon, unspecified: Secondary | ICD-10-CM

## 2014-04-24 LAB — BASIC METABOLIC PANEL (CC13)
Anion Gap: 10 mEq/L (ref 3–11)
BUN: 38 mg/dL — ABNORMAL HIGH (ref 7.0–26.0)
CO2: 25 mEq/L (ref 22–29)
Calcium: 9.4 mg/dL (ref 8.4–10.4)
Chloride: 108 mEq/L (ref 98–109)
Creatinine: 2.4 mg/dL — ABNORMAL HIGH (ref 0.6–1.1)
EGFR: 19 mL/min/{1.73_m2} — ABNORMAL LOW (ref >90)
GLUCOSE: 111 mg/dL (ref 70–140)
POTASSIUM: 4.4 meq/L (ref 3.5–5.1)
Sodium: 142 mEq/L (ref 136–145)

## 2014-04-24 NOTE — Telephone Encounter (Signed)
Called and informed pt potassium in normal range, she can stop taking Kayexalate. And to f/u with urology about her stent. Pt verbalized understanding and states she has an appt with urology Friday. Informed pt to call with any questions or concerns.

## 2014-04-24 NOTE — Telephone Encounter (Signed)
-----   Message from Owens Shark, NP sent at 04/24/2014  3:46 PM EDT ----- Please let her know potassium is now in normal range. No further Kayexalate. Follow-up with urology regarding the stent.

## 2014-04-25 ENCOUNTER — Encounter (HOSPITAL_BASED_OUTPATIENT_CLINIC_OR_DEPARTMENT_OTHER): Payer: Self-pay | Admitting: *Deleted

## 2014-04-25 NOTE — Progress Notes (Signed)
NPO AFTER MN WITH EXCEPTION CLEAR LIQUIDS UNTIL 0830 (NO CREAM/ MILK PRODUCTS).  ARRIVE AT 1300. CURRENT LAB RESULTS AND EKG IN CHART AND EPIC.  WILL TAKE TOPROL AND TYLENOL AM DOS W/ SIPS OF WATER.

## 2014-04-27 ENCOUNTER — Ambulatory Visit (HOSPITAL_BASED_OUTPATIENT_CLINIC_OR_DEPARTMENT_OTHER): Payer: Medicare Other | Admitting: Anesthesiology

## 2014-04-27 ENCOUNTER — Encounter (HOSPITAL_BASED_OUTPATIENT_CLINIC_OR_DEPARTMENT_OTHER): Payer: Self-pay

## 2014-04-27 ENCOUNTER — Encounter (HOSPITAL_BASED_OUTPATIENT_CLINIC_OR_DEPARTMENT_OTHER)
Admission: RE | Disposition: A | Discharge status: Home / Self Care | Payer: Self-pay | Source: Ambulatory Visit | Attending: Urology

## 2014-04-27 ENCOUNTER — Ambulatory Visit (HOSPITAL_BASED_OUTPATIENT_CLINIC_OR_DEPARTMENT_OTHER)
Admission: RE | Admit: 2014-04-27 | Discharge: 2014-04-27 | Disposition: A | Discharge status: Home / Self Care | Payer: Medicare Other | Source: Ambulatory Visit | Attending: Urology | Admitting: Urology

## 2014-04-27 DIAGNOSIS — Z792 Long term (current) use of antibiotics: Secondary | ICD-10-CM | POA: Insufficient documentation

## 2014-04-27 DIAGNOSIS — Z7982 Long term (current) use of aspirin: Secondary | ICD-10-CM | POA: Insufficient documentation

## 2014-04-27 DIAGNOSIS — I4891 Unspecified atrial fibrillation: Secondary | ICD-10-CM | POA: Insufficient documentation

## 2014-04-27 DIAGNOSIS — M1990 Unspecified osteoarthritis, unspecified site: Secondary | ICD-10-CM | POA: Insufficient documentation

## 2014-04-27 DIAGNOSIS — N1330 Unspecified hydronephrosis: Secondary | ICD-10-CM | POA: Insufficient documentation

## 2014-04-27 DIAGNOSIS — Z6841 Body Mass Index (BMI) 40.0 and over, adult: Secondary | ICD-10-CM | POA: Diagnosis not present

## 2014-04-27 DIAGNOSIS — Z85038 Personal history of other malignant neoplasm of large intestine: Secondary | ICD-10-CM | POA: Insufficient documentation

## 2014-04-27 DIAGNOSIS — E780 Pure hypercholesterolemia: Secondary | ICD-10-CM | POA: Diagnosis not present

## 2014-04-27 DIAGNOSIS — D649 Anemia, unspecified: Secondary | ICD-10-CM | POA: Insufficient documentation

## 2014-04-27 DIAGNOSIS — I10 Essential (primary) hypertension: Secondary | ICD-10-CM | POA: Insufficient documentation

## 2014-04-27 DIAGNOSIS — I739 Peripheral vascular disease, unspecified: Secondary | ICD-10-CM | POA: Insufficient documentation

## 2014-04-27 DIAGNOSIS — Z87891 Personal history of nicotine dependence: Secondary | ICD-10-CM | POA: Insufficient documentation

## 2014-04-27 HISTORY — DX: Presence of spectacles and contact lenses: Z97.3

## 2014-04-27 HISTORY — DX: Frequency of micturition: R35.0

## 2014-04-27 HISTORY — DX: Complete loss of teeth, unspecified cause, unspecified class: Z97.2

## 2014-04-27 HISTORY — PX: CYSTOSCOPY W/ URETERAL STENT PLACEMENT: SHX1429

## 2014-04-27 HISTORY — DX: Anorexia: R63.0

## 2014-04-27 HISTORY — DX: Chronic atrial fibrillation, unspecified: I48.20

## 2014-04-27 HISTORY — DX: Secondary malignant neoplasm of unspecified site: C79.9

## 2014-04-27 HISTORY — DX: Malignant neoplasm of colon, unspecified: C18.9

## 2014-04-27 HISTORY — DX: Weakness: R53.1

## 2014-04-27 HISTORY — DX: Unspecified hydronephrosis: N13.30

## 2014-04-27 HISTORY — DX: Complete loss of teeth, unspecified cause, unspecified class: K08.109

## 2014-04-27 SURGERY — CYSTOSCOPY, WITH RETROGRADE PYELOGRAM AND URETERAL STENT INSERTION
Anesthesia: General | Site: Ureter | Laterality: Right

## 2014-04-27 MED ORDER — ACETAMINOPHEN-CODEINE #3 300-30 MG PO TABS: 1.0000 | ORAL_TABLET | ORAL | Status: DC | PRN

## 2014-04-27 MED ORDER — PROPOFOL 10 MG/ML IV BOLUS
INTRAVENOUS | Status: DC | PRN
  Administered 2014-04-27: 40 mg via INTRAVENOUS
  Administered 2014-04-27 (×2): 20 mg via INTRAVENOUS
  Administered 2014-04-27: 40 mg via INTRAVENOUS

## 2014-04-27 MED ORDER — STERILE WATER FOR IRRIGATION IR SOLN
Status: DC | PRN
  Administered 2014-04-27: 3000 mL

## 2014-04-27 MED ORDER — PROPOFOL INFUSION 10 MG/ML OPTIME
INTRAVENOUS | Status: DC | PRN
  Administered 2014-04-27: 50 ug/kg/min via INTRAVENOUS

## 2014-04-27 MED ORDER — IOTHALAMATE MEGLUMINE 17.2 % UR SOLN
URETHRAL | Status: DC | PRN
  Administered 2014-04-27: 18 mL

## 2014-04-27 MED ORDER — MIDAZOLAM HCL 2 MG/2ML IJ SOLN
INTRAMUSCULAR | Status: AC
  Filled 2014-04-27: qty 2

## 2014-04-27 MED ORDER — PHENAZOPYRIDINE HCL 200 MG PO TABS: 200.0000 mg | ORAL_TABLET | Freq: Three times a day (TID) | ORAL | Status: DC | PRN

## 2014-04-27 MED ORDER — CIPROFLOXACIN IN D5W 400 MG/200ML IV SOLN
INTRAVENOUS | Status: AC
  Filled 2014-04-27: qty 200

## 2014-04-27 MED ORDER — FENTANYL CITRATE (PF) 100 MCG/2ML IJ SOLN
25.0000 ug | INTRAMUSCULAR | Status: DC | PRN
  Filled 2014-04-27: qty 1

## 2014-04-27 MED ORDER — SODIUM CHLORIDE 0.9 % IV SOLN
INTRAVENOUS | Status: DC
Start: 2014-04-27 — End: 2014-04-27
  Administered 2014-04-27: 14:00:00 via INTRAVENOUS
  Filled 2014-04-27: qty 1000

## 2014-04-27 MED ORDER — CIPROFLOXACIN IN D5W 400 MG/200ML IV SOLN
400.0000 mg | INTRAVENOUS | Status: AC
  Administered 2014-04-27: 400 mg via INTRAVENOUS
  Filled 2014-04-27: qty 200

## 2014-04-27 MED ORDER — PROMETHAZINE HCL 25 MG/ML IJ SOLN
6.2500 mg | INTRAMUSCULAR | Status: DC | PRN
  Filled 2014-04-27: qty 1

## 2014-04-27 MED ORDER — FENTANYL CITRATE (PF) 100 MCG/2ML IJ SOLN
INTRAMUSCULAR | Status: DC | PRN
  Administered 2014-04-27 (×4): 25 ug via INTRAVENOUS

## 2014-04-27 MED ORDER — MEPERIDINE HCL 25 MG/ML IJ SOLN
6.2500 mg | INTRAMUSCULAR | Status: DC | PRN
  Filled 2014-04-27: qty 1

## 2014-04-27 MED ORDER — LACTATED RINGERS IV SOLN
INTRAVENOUS | Status: DC
  Administered 2014-04-27: 16:00:00 via INTRAVENOUS
  Filled 2014-04-27: qty 1000

## 2014-04-27 MED ORDER — FENTANYL CITRATE (PF) 100 MCG/2ML IJ SOLN
INTRAMUSCULAR | Status: AC
  Filled 2014-04-27: qty 4

## 2014-04-27 SURGICAL SUPPLY — 37 items
ADAPTER CATH URET PLST 4-6FR (CATHETERS) IMPLANT
BAG DRAIN URO-CYSTO SKYTR STRL (DRAIN) ×3 IMPLANT
BASKET LASER NITINOL 1.9FR (BASKET) IMPLANT
BASKET STNLS GEMINI 4WIRE 3FR (BASKET) IMPLANT
BASKET ZERO TIP NITINOL 2.4FR (BASKET) IMPLANT
CANISTER SUCT LVC 12 LTR MEDI- (MISCELLANEOUS) ×3 IMPLANT
CATH INTERMIT  6FR 70CM (CATHETERS) ×3 IMPLANT
CATH URET 5FR 28IN CONE TIP (BALLOONS)
CATH URET 5FR 28IN OPEN ENDED (CATHETERS) IMPLANT
CATH URET 5FR 70CM CONE TIP (BALLOONS) IMPLANT
CATH URET DUAL LUMEN 6-10FR 50 (CATHETERS) IMPLANT
CLOTH BEACON ORANGE TIMEOUT ST (SAFETY) ×3 IMPLANT
DRSG TEGADERM 2-3/8X2-3/4 SM (GAUZE/BANDAGES/DRESSINGS) IMPLANT
FIBER LASER FLEXIVA 200 (UROLOGICAL SUPPLIES) IMPLANT
FIBER LASER FLEXIVA 365 (UROLOGICAL SUPPLIES) IMPLANT
GLOVE BIO SURGEON STRL SZ 6.5 (GLOVE) ×2 IMPLANT
GLOVE BIO SURGEON STRL SZ7.5 (GLOVE) ×3 IMPLANT
GLOVE BIO SURGEONS STRL SZ 6.5 (GLOVE) ×1
GLOVE INDICATOR 6.5 STRL GRN (GLOVE) ×3 IMPLANT
GOWN STRL REUS W/ TWL LRG LVL3 (GOWN DISPOSABLE) ×1 IMPLANT
GOWN STRL REUS W/ TWL XL LVL3 (GOWN DISPOSABLE) ×1 IMPLANT
GOWN STRL REUS W/TWL LRG LVL3 (GOWN DISPOSABLE) ×2
GOWN STRL REUS W/TWL XL LVL3 (GOWN DISPOSABLE) ×2
GUIDEWIRE 0.038 PTFE COATED (WIRE) IMPLANT
GUIDEWIRE ANG ZIPWIRE 038X150 (WIRE) IMPLANT
GUIDEWIRE STR DUAL SENSOR (WIRE) ×3 IMPLANT
IV NS IRRIG 3000ML ARTHROMATIC (IV SOLUTION) ×6 IMPLANT
KIT BALLIN UROMAX 15FX10 (LABEL) IMPLANT
KIT BALLN UROMAX 15FX4 (MISCELLANEOUS) IMPLANT
KIT BALLN UROMAX 26 75X4 (MISCELLANEOUS)
NS IRRIG 500ML POUR BTL (IV SOLUTION) IMPLANT
PACK CYSTO (CUSTOM PROCEDURE TRAY) ×3 IMPLANT
SET HIGH PRES BAL DIL (LABEL)
SHEATH ACCESS URETERAL 38CM (SHEATH) IMPLANT
STENT 4.5X24 (STENTS) ×3 IMPLANT
STENT URET 6FRX22 CONTOUR (STENTS) IMPLANT
TUBE FEEDING 8FR 16IN STR KANG (MISCELLANEOUS) IMPLANT

## 2014-04-27 NOTE — Anesthesia Preprocedure Evaluation (Addendum)
Anesthesia Evaluation  Patient identified by MRN, date of birth, ID band Patient awake    Reviewed: Allergy & Precautions, H&P , NPO status , Patient's Chart, lab work & pertinent test results  Airway Mallampati: II  TM Distance: >3 FB Neck ROM: Full    Dental no notable dental hx.    Pulmonary former smoker,  breath sounds clear to auscultation  Pulmonary exam normal       Cardiovascular hypertension, + Peripheral Vascular Disease + dysrhythmias Atrial Fibrillation Rhythm:Regular Rate:Normal     Neuro/Psych negative neurological ROS  negative psych ROS   GI/Hepatic Neg liver ROS, hiatal hernia, Colon CA   Endo/Other  Morbid obesity  Renal/GU CRFRenal disease  negative genitourinary   Musculoskeletal negative musculoskeletal ROS (+)   Abdominal   Peds negative pediatric ROS (+)  Hematology negative hematology ROS (+) anemia ,   Anesthesia Other Findings Colon Ca  Reproductive/Obstetrics negative OB ROS                            Anesthesia Physical  Anesthesia Plan  ASA: III  Anesthesia Plan: General   Post-op Pain Management:    Induction: Intravenous  Airway Management Planned: Oral ETT and LMA  Additional Equipment:   Intra-op Plan:   Post-operative Plan: Extubation in OR  Informed Consent: I have reviewed the patients History and Physical, chart, labs and discussed the procedure including the risks, benefits and alternatives for the proposed anesthesia with the patient or authorized representative who has indicated his/her understanding and acceptance.     Plan Discussed with:   Anesthesia Plan Comments:         Anesthesia Quick Evaluation

## 2014-04-27 NOTE — Anesthesia Postprocedure Evaluation (Signed)
  Anesthesia Post-op Note  Patient: Natalie Chapman  Procedure(s) Performed: Procedure(s) (LRB): CYSTOSCOPY WITH RIGHT URETERAL STENT PLACEMENT (Right)  Patient Location: PACU  Anesthesia Type: MAC  Level of Consciousness: awake and alert   Airway and Oxygen Therapy: Patient Spontanous Breathing  Post-op Pain: mild  Post-op Assessment: Post-op Vital signs reviewed, Patient's Cardiovascular Status Stable, Respiratory Function Stable, Patent Airway and No signs of Nausea or vomiting  Last Vitals:  Filed Vitals:   04/27/14 1657  BP: 114/53  Pulse: 72  Temp:   Resp: 16    Post-op Vital Signs: stable   Complications: No apparent anesthesia complications

## 2014-04-27 NOTE — Op Note (Signed)
Preoperative diagnosis:  1. Right hydronephrosis secondary to extrinsic compression from malignancy   Postoperative diagnosis:  1. Same   Procedure:  1. Cystoscopy 2. right ureteral stent placement 3. right retrograde pyelography with interpretation   Surgeon: Ardis Hughs, MD  Anesthesia: General  Complications: None  Intraoperative findings: The patient's bladder was normal appearing.  15 cc of contrast was instilled into the patient's right ureteral orifice demonstrating compression of the ureter 5 cm proximal to the ureterovesical junction.  In addition, the ureter was tortuous and looped around proximally just before the ureteropelvic junction.  However, there are no filling defects.  Ultimately, I was able to straighten the ureter and placed a 4.9 cm 24 cm double-J ureteral stent without significant complication.I was unable to advance the 6 French double-J stent up the ureter because of the obstruction.  EBL: Minimal  Specimens: None  Indication: Natalie Chapman is a 74 y.o. patient with Recurrence of her colon cancer. A recent staging study demonstrated right hydronephrosis.  Further, she has demonstrated renal insufficiency.After reviewing the management options for treatment, he elected to proceed with the above surgical procedure(s). We have discussed the potential benefits and risks of the procedure, side effects of the proposed treatment, the likelihood of the patient achieving the goals of the procedure, and any potential problems that might occur during the procedure or recuperation. Informed consent has been obtained.  Description of procedure:  The patient was taken to the operating room and general anesthesia was induced.  The patient was placed in the dorsal lithotomy position, prepped and draped in the usual sterile fashion, and preoperative antibiotics were administered. However, because of the patient's hip issues we are unable to put the left leg in stirrups.   As such, leg holder was used.A preoperative time-out was performed.   Cystourethroscopy was performed.  . The bladder was then systematically examined in its entirety. There was no evidence for any bladder tumors, stones, or other mucosal pathology.    Attention then turned to the rightureteral orifice and a ureteral catheter was used to intubate the ureteral orifice.  Omnipaque contrast was injected through the ureteral catheter and a retrograde pyelogram was performed with findings as dictated above.  A 0.38 sensor guidewire was then advanced up the right ureter into the renal pelvis under fluoroscopic guidance.  The wire was then backloaded through the cystoscope and a ureteral stent was advance over the wire using Seldinger technique.  The stent was positioned appropriately under fluoroscopic and cystoscopic guidance.  The wire was then removed with an adequate stent curl noted in the renal pelvis as well as in the bladder.  The bladder was then emptied and the procedure ended.  The patient appeared to tolerate the procedure well and without complications.  The patient was able to be awakened and transferred to the recovery unit in satisfactory condition.    Ardis Hughs, M.D.  Cc: Dr. Julieanne Manson, M.D.

## 2014-04-27 NOTE — H&P (Signed)
Reason For Visit Right hydronephrosis   History of Present Illness This 74 year old female with a history of colon cancer who had resection in July 2015. She subsequently underwent chemotherapy. Her most recent CT scan demonstrated right hydronephrosis and some thickening of the anastomosis, suspicious for recurrence. This area was then biopsied and returned as recurrent adenocarcinoma. Tentatively, the patient is planning to get more systemic chemotherapy.. Given the right hydronephrosis and baseline compromise kidney function was suggested that the patient see a urologist for possible right stent placement.  The patient denies any symptoms in terms of right flank pain, suprapubic pain or progressive voiding symptoms. She's not had any gross hematuria. His no history of kidney stones. She tolerated her previous chemotherapy quite well.  The patient is morbidly obese and has atrial fibrillation but is otherwise reasonably healthy aside from her recurrent colon cancer.   Past Medical History Problems  1. History of arthritis (Z87.39) 2. History of atrial fibrillation (Z86.79) 3. History of hypercholesterolemia (Z86.39) 4. History of hypertension (Z86.79) 5. History of malignant neoplasm of colon (Y30.160)  Surgical History Problems  1. History of Colon Surgery 2. History of Gallbladder Surgery 3. History of Gastric Surgery 4. History of Hernia Repair 5. History of Tubal Ligation  Current Meds 1. Aspirin 81 MG Oral Tablet;  Therapy: (Recorded:07Apr2016) to Recorded 2. Atorvastatin Calcium 10 MG Oral Tablet;  Therapy: (Recorded:07Apr2016) to Recorded 3. Colace 100 MG Oral Capsule;  Therapy: (Recorded:07Apr2016) to Recorded 4. Ferrous Sulfate 325 MG CAPS;  Therapy: (Recorded:07Apr2016) to Recorded 5. Furosemide 20 MG Oral Tablet;  Therapy: (Recorded:07Apr2016) to Recorded 6. Metoprolol Succinate ER 25 MG Oral Tablet Extended Release 24 Hour;  Therapy: (Recorded:07Apr2016) to  Recorded 7. Multi Vitamin/Minerals TABS;  Therapy: (Recorded:07Apr2016) to Recorded 8. Polyethylene Glycol 3350 Oral Powder;  Therapy: (Recorded:07Apr2016) to Recorded 9. Tylenol Arthritis Pain 650 MG Oral Tablet Extended Release;  Therapy: (Recorded:07Apr2016) to Recorded 10. Vasotec 2.5 MG Oral Tablet (Enalapril Maleate);   Therapy: (Recorded:07Apr2016) to Recorded 11. Vitamin D3 400 UNIT Oral Tablet;   Therapy: (Recorded:07Apr2016) to Recorded  Allergies Medication  1. No Known Drug Allergies  Family History Problems  1. Family history of Death of family member : Mother, Father   Mother in late 46s; heart attackFather at age 25; heart attack 2. Family history of myocardial infarction (Z82.49) : Mother, Father 3. Family history of prostate cancer (Z80.42) : Brother  Social History Problems    Denied: History of Alcohol use   Caffeine use (F15.90)   1 every other day; occasional   Former smoker [id number])   2 ppd and quit 25 years ago   Number of children   1 son and 1 daughter   Widowed  Review of Systems  Genitourinary: urinary frequency and nocturia.  Gastrointestinal: diarrhea and constipation.  Cardiovascular: leg swelling.  Musculoskeletal: back pain and joint pain.    Vitals Vital Signs [Data Includes: Last 1 Day]  Recorded: 07Apr2016 03:55PM  Height: 5 ft 1 in Weight: 233 lb  BMI Calculated: 44.03 BSA Calculated: 2.02 Blood Pressure: 128 / 78 Temperature: 97.4 F Heart Rate: 82  Physical Exam Constitutional: Well nourished and well developed . No acute distress.  ENT:. The ears and nose are normal in appearance.  Neck: The appearance of the neck is normal and no neck mass is present.  Pulmonary: No respiratory distress and normal respiratory rhythm and effort.  Cardiovascular: Heart rate and rhythm are normal.  Abdomen: The abdomen is obese. The abdomen is soft and nontender.  Skin: Normal skin turgor, no visible rash and no visible skin  lesions.  Neuro/Psych:. Mood and affect are appropriate.    Results/Data Urine [Data Includes: Last 1 Day]   07Apr2016  COLOR YELLOW   APPEARANCE CLEAR   SPECIFIC GRAVITY 1.020   pH 5.0   GLUCOSE NEG mg/dL  BILIRUBIN NEG   KETONE NEG mg/dL  BLOOD NEG   PROTEIN NEG mg/dL  UROBILINOGEN 0.2 mg/dL  NITRITE NEG   LEUKOCYTE ESTERASE SMALL   SQUAMOUS EPITHELIAL/HPF FEW   WBC 3-6 WBC/hpf  RBC 0-2 RBC/hpf  BACTERIA FEW   CRYSTALS NONE SEEN   CASTS NONE SEEN    The patient's urinalysis today does not appear to be infected, there is some white blood cells and trace red blood cells. Urine culture was sent.  I have independently reviewed the patient's recent CT scan which demonstrates mild hydronephrosis and is dilated in the proximal ureter. There seems to be a transition point in the retroperitoneum.   Assessment Assessed  1. Hydronephrosis, right (N13.30)  Patient has right hydronephrosis likely secondary to extrinsic compression in the retroperitoneum from her recurrent cancer. She has mildly compromised renal function baseline. She is tentatively scheduled to get systemic chemotherapy in the near future.   Plan Health Maintenance  1. UA With REFLEX; [Do Not Release]; Status:Complete;   Done: 07Apr2016 03:05PM Hydronephrosis, right  2. Follow-up Schedule Surgery Office  Follow-up  Status: Hold For - Appointment   Requested for: 07Apr2016 3. URINE CULTURE; Status:In Progress - Specimen/Data Collected;   Done: 07Apr2016  Discussion/Summary Given the above, I recommended that we proceed with right ureteral stent placement. This will optimize the patient's renal function and hopefully allow her to get the chemotherapy necessary to treat her recurrent cancer. I went over the procedure with the patient and her son in great detail. She understands that she will need a stent exchange in 3 months. I described her symptoms a stent irritation including bladder pain, hematuria, urgency,  frequency, and incontinence. We'll work to get this scheduled as soon as possible.

## 2014-04-27 NOTE — Anesthesia Procedure Notes (Signed)
Performed by: Bethena Roys T Oxygen Delivery Method: Simple face mask

## 2014-04-27 NOTE — Transfer of Care (Signed)
Immediate Anesthesia Transfer of Care Note  Patient: Natalie Chapman  Procedure(s) Performed: Procedure(s): CYSTOSCOPY WITH RIGHT URETERAL STENT PLACEMENT (Right)  Patient Location: PACU  Anesthesia Type:MAC  Level of Consciousness: awake and oriented  Airway & Oxygen Therapy: Patient Spontanous Breathing and Patient connected to face mask oxygen  Post-op Assessment: Report given to RN  Post vital signs: Reviewed and stable  Last Vitals:  Filed Vitals:   04/27/14 1304  BP: 136/62  Pulse: 85  Temp: 36.3 C  Resp: 18    Complications: No apparent anesthesia complications

## 2014-04-27 NOTE — Discharge Instructions (Signed)
DISCHARGE INSTRUCTIONS FOR KIDNEY STONE/URETERAL STENT   MEDICATIONS:  1.  Resume all your other meds from home - except do not take any extra narcotic pain meds that you may have at home.  2. Pyridium is to help with the burning/stinging when you urinate. 3. Tylenol with codeine is for moderate/severe pain, otherwise taking upto 1000mg  every 6 hours of plainTylenol will help treat your pain.  Do not take both at the same time.   ACTIVITY:  1. No strenuous activity x 1week  2. No driving while on narcotic pain medications  3. Drink plenty of water  4. Continue to walk at home - you can still get blood clots when you are at home, so keep active, but don't over do it.  5. May return to work/school tomorrow or when you feel ready   BATHING:  1. You can shower and we recommend daily showers    SIGNS/SYMPTOMS TO CALL:  Please call us if you have a fever greater than 101.5, uncontrolled nausea/vomiting, uncontrolled pain, dizziness, unable to urinate, bloody urine, chest pain, shortness of breath, leg swelling, leg pain, redness around wound, drainage from wound, or any other concerns or questions.   You can reach [place] at [phone].   FOLLOW-UP:  You will be scheduled for a stent exchange in 3  months. Post Anesthesia Home Care Instructions  Activity: Get plenty of rest for the remainder of the day. A responsible adult should stay with you for 24 hours following the procedure.  For the next 24 hours, DO NOT: -Drive a car -Paediatric nurse -Drink alcoholic beverages -Take any medication unless instructed by your physician -Make any legal decisions or sign important papers.  Meals: Start with liquid foods such as gelatin or soup. Progress to regular foods as tolerated. Avoid greasy, spicy, heavy foods. If nausea and/or vomiting occur, drink only clear liquids until the nausea and/or vomiting subsides. Call your physician if vomiting continues.  Special Instructions/Symptoms: Your  throat may feel dry or sore from the anesthesia or the breathing tube placed in your throat during surgery. If this causes discomfort, gargle with warm salt water. The discomfort should disappear within 24 hours.

## 2014-04-30 ENCOUNTER — Encounter (HOSPITAL_BASED_OUTPATIENT_CLINIC_OR_DEPARTMENT_OTHER): Payer: Self-pay | Admitting: Urology

## 2014-05-02 ENCOUNTER — Other Ambulatory Visit (HOSPITAL_BASED_OUTPATIENT_CLINIC_OR_DEPARTMENT_OTHER): Payer: Medicare Other

## 2014-05-02 ENCOUNTER — Ambulatory Visit (HOSPITAL_COMMUNITY)
Admission: RE | Admit: 2014-05-02 | Discharge: 2014-05-02 | Disposition: A | Discharge status: Home / Self Care | Payer: Medicare Other | Source: Ambulatory Visit | Attending: Oncology | Admitting: Oncology

## 2014-05-02 DIAGNOSIS — C182 Malignant neoplasm of ascending colon: Secondary | ICD-10-CM

## 2014-05-02 DIAGNOSIS — R935 Abnormal findings on diagnostic imaging of other abdominal regions, including retroperitoneum: Secondary | ICD-10-CM | POA: Diagnosis not present

## 2014-05-02 DIAGNOSIS — Z08 Encounter for follow-up examination after completed treatment for malignant neoplasm: Secondary | ICD-10-CM | POA: Diagnosis not present

## 2014-05-02 DIAGNOSIS — C779 Secondary and unspecified malignant neoplasm of lymph node, unspecified: Secondary | ICD-10-CM | POA: Diagnosis not present

## 2014-05-02 DIAGNOSIS — C189 Malignant neoplasm of colon, unspecified: Secondary | ICD-10-CM | POA: Insufficient documentation

## 2014-05-02 DIAGNOSIS — Z9049 Acquired absence of other specified parts of digestive tract: Secondary | ICD-10-CM | POA: Insufficient documentation

## 2014-05-02 DIAGNOSIS — N1330 Unspecified hydronephrosis: Secondary | ICD-10-CM | POA: Diagnosis not present

## 2014-05-02 LAB — BASIC METABOLIC PANEL (CC13)
Anion Gap: 13 mEq/L — ABNORMAL HIGH (ref 3–11)
BUN: 29.2 mg/dL — AB (ref 7.0–26.0)
CALCIUM: 9.6 mg/dL (ref 8.4–10.4)
CHLORIDE: 108 meq/L (ref 98–109)
CO2: 20 meq/L — AB (ref 22–29)
CREATININE: 1.9 mg/dL — AB (ref 0.6–1.1)
EGFR: 26 mL/min/{1.73_m2} — ABNORMAL LOW (ref >90)
Glucose: 102 mg/dl (ref 70–140)
Potassium: 5 mEq/L (ref 3.5–5.1)
Sodium: 141 mEq/L (ref 136–145)

## 2014-05-02 LAB — GLUCOSE, CAPILLARY: Glucose-Capillary: 92 mg/dL (ref 70–99)

## 2014-05-02 MED ORDER — FLUDEOXYGLUCOSE F - 18 (FDG) INJECTION
11.5000 | Freq: Once | INTRAVENOUS | Status: AC | PRN
  Administered 2014-05-02: 11.5 via INTRAVENOUS

## 2014-05-03 ENCOUNTER — Telehealth: Payer: Self-pay | Admitting: Nurse Practitioner

## 2014-05-03 ENCOUNTER — Encounter (HOSPITAL_BASED_OUTPATIENT_CLINIC_OR_DEPARTMENT_OTHER): Payer: Self-pay | Admitting: Urology

## 2014-05-03 ENCOUNTER — Other Ambulatory Visit: Payer: Medicare Other

## 2014-05-03 ENCOUNTER — Ambulatory Visit (HOSPITAL_BASED_OUTPATIENT_CLINIC_OR_DEPARTMENT_OTHER): Payer: Medicare Other | Admitting: Nurse Practitioner

## 2014-05-03 VITALS — BP 115/81 | HR 74 | Temp 97.7°F | Resp 18 | Ht 60.0 in | Wt 230.8 lb

## 2014-05-03 DIAGNOSIS — C786 Secondary malignant neoplasm of retroperitoneum and peritoneum: Secondary | ICD-10-CM | POA: Diagnosis not present

## 2014-05-03 DIAGNOSIS — Z87891 Personal history of nicotine dependence: Secondary | ICD-10-CM

## 2014-05-03 DIAGNOSIS — N289 Disorder of kidney and ureter, unspecified: Secondary | ICD-10-CM | POA: Diagnosis not present

## 2014-05-03 DIAGNOSIS — J449 Chronic obstructive pulmonary disease, unspecified: Secondary | ICD-10-CM

## 2014-05-03 DIAGNOSIS — I4891 Unspecified atrial fibrillation: Secondary | ICD-10-CM

## 2014-05-03 DIAGNOSIS — C779 Secondary and unspecified malignant neoplasm of lymph node, unspecified: Secondary | ICD-10-CM

## 2014-05-03 DIAGNOSIS — D649 Anemia, unspecified: Secondary | ICD-10-CM

## 2014-05-03 DIAGNOSIS — R630 Anorexia: Secondary | ICD-10-CM

## 2014-05-03 DIAGNOSIS — C189 Malignant neoplasm of colon, unspecified: Secondary | ICD-10-CM

## 2014-05-03 MED ORDER — LIDOCAINE-PRILOCAINE 2.5-2.5 % EX CREA: 1.0000 "application " | TOPICAL_CREAM | CUTANEOUS | Status: AC | PRN

## 2014-05-03 MED ORDER — PROCHLORPERAZINE MALEATE 5 MG PO TABS: 5.0000 mg | ORAL_TABLET | Freq: Four times a day (QID) | ORAL | Status: DC | PRN

## 2014-05-03 NOTE — Progress Notes (Addendum)
[facility] OFF[facility] PROGRESS NOTE   Diagnosis:  Colon cancer  INTERVAL HISTORY:   Natalie Chapman returns as scheduled. She underwent placement of a right ureter stent on 04/27/2014. There was noted to be compression of the ureter 5 cm proximal to the ureterovesical junction.  She has intermittent pain at the right abdomen. The pain is not consistent and does not require pain medication. No nausea/vomiting. Bowels moving. She takes a laxative as needed. She denies baseline neuropathy symptoms.  Objective:  Vital signs in last 24 hours:  Blood pressure 115/81, pulse 74, temperature 97.7 F (36.5 C), temperature source Oral, resp. rate 18, height 5' (1.524 m), weight 230 lb 12.8 oz (104.69 kg), SpO2 99 %.    HEENT: No thrush or ulcers. Mucous membranes are moist. Resp: Lungs clear bilaterally. Cardio: Irregular. GI: Firm nodularity deep to the right lower quadrant surgical scar. Vascular: Lower leg edema bilaterally with chronic stasis changes.    Lab Results:  Lab Results  Component Value Date   WBC 4.4 04/23/2014   HGB 9.4* 04/23/2014   HCT 28.8* 04/23/2014   MCV 96.0 04/23/2014   PLT 269 04/23/2014   NEUTROABS 3.2 04/23/2014    Imaging:  Nm Pet Image Restag (ps) Skull Base To Thigh  05/02/2014   CLINICAL DATA:  Subsequent treatment strategy for colon cancer.  EXAM: NUCLEAR MEDICINE PET SKULL BASE TO THIGH  TECHNIQUE: 11.5 mCi F-18 FDG was injected intravenously. Full-ring PET imaging was performed from the skull base to thigh after the radiotracer. CT data was obtained and used for attenuation correction and anatomic localization.  FASTING BLOOD GLUCOSE:  Value: 92 mg/dl  COMPARISON:  CT abdomen pelvis dated 04/05/2014. PET-CT dated 08/24/2013.  FINDINGS: NECK  No hypermetabolic lymph nodes in the neck.  CHEST  Chronic interstitial lung disease/ fibrosis in the lungs bilaterally.  The heart is normal in size. Coronary atherosclerosis. Atherosclerotic  calcifications of the aortic arch.  No suspicious/hypermetabolic thoracic lymphadenopathy.  ABDOMEN/PELVIS  Status post right hemicolectomy.  Soft tissue/ stranding anterior to the ileocolic anastomosis in the right mid abdomen (series 4/ image 143), max SUV 10.2, worrisome for recurrent tumor.  Mild right hydronephrosis with indwelling double pigtail ureteral stent. Associated abnormal soft tissue in the right pelvis (series 4/ image 162), max SUV 5.4, worrisome for pelvic metastasis.  Additional soft tissue/fluid along the right liver (series 4/ image 106), max SUV 6.3, worrisome for peritoneal disease.  Soft tissue implant along the right mid/lower abdominal wall, series 4/ image 149, max SUV 7.3, worrisome for metastatic disease.  13 mm soft tissue nodule in the midline lower abdominal wall (series 4/image 171), max SUV 8.1, worrisome for lower pelvic nodal metastasis.  Postsurgical changes involving the stomach with surgical clips in the left upper abdomen.  SKELETON  No focal hypermetabolic activity to suggest skeletal metastasis.  IMPRESSION: Status post right hemicolectomy.  Hypermetabolic soft tissue adjacent to the ileocolic anastomosis beneath the right mid abdominal wall, worrisome for recurrent tumor.  Suspected soft tissue metastasis in the right mid abdominal wall, peritoneal disease along the right liver, and pelvic nodal metastases.  Suspected right pelvic implant. Associated mild right hydronephrosis with indwelling double pigtail ureteral stent.   Electronically Signed   By: Julian Hy M.D.   On: 05/02/2014 15:17    Medications: I have reviewed the patient's current medications.  Assessment/Plan: 1. 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 8 of  15 lymph nodes contained metastatic carcinoma, tumor deposits were present; microsatellite stable.  PET scan 08/24/2013 with mildly  hypermetabolic small left axillary and right inguinal nodes--most likely unrelated to colon cancer, no clear evidence of metastatic disease.   Cycle 1 adjuvant Xeloda 09/05/2013  Cycle 2 adjuvant Xeloda 09/26/2013  Cycle 3 adjuvant Xeloda 10/17/2013  Cycle 4 adjuvant Xeloda 11/07/2013  Cycle 5 adjuvant Xeloda 11/28/2013  Cycle 6 adjuvant Xeloda 12/19/2013  Cycle 7 adjuvant Xeloda 01/09/2014  Cycle 8 adjuvant Xeloda 01/30/2014  Status post biopsy of nodularity at the right lower quadrant scar on 03/27/2014 confirming metastatic colon cancer  CT 04/05/2014 with new right hydronephrosis, soft tissue thickening adjacent to the ileocolonic anastomosis and nodularity in the overlying subcutaneous tissue  PET scan 05/02/2014 with hypermetabolic soft tissue adjacent to the ileocolic anastomosis beneath the right abdominal wall. Suspected soft tissue metastasis in the right abdominal wall, peritoneal disease along the right liver and pelvic nodal metastases. Suspected right pelvic implant. Associated mild right hydronephrosis with indwelling ureteral stent. 2. Atrial fibrillation 3. Anemia secondary to bleeding from the colon cancer versus renal insufficiency, and Xeloda. Improved. 4. History of tobacco use with COPD changes on the CT 04/07/2013  5. Morbid obesity  6. Status post gastric banding surgery  7. Renal insufficiency  8. Anorexia-etiology unclear. Improved 9. Right hydronephrosis on the CT 04/05/2014. She was referred to urology and underwent placement of a ureter stent on 04/27/2014. Renal function has improved.   Disposition: Natalie Chapman appears stable. Dr. Benay Spice reviewed the PET scan result with her and her family. She understands she has metastatic disease and no therapy will be curative. Dr. Benay Spice discussed systemic therapy versus a supportive care approach. She would like to proceed with systemic therapy.   Dr. Benay Spice recommends the FOLFOX regimen. We reviewed  potential toxicities including myelosuppression, nausea, allergic reaction. We discussed the potential for mucositis, diarrhea, hand-foot syndrome, rash with 5 fluorouracil. We discussed the neurotoxicity associated with oxaliplatin including cold sensitivity, peripheral neuropathy, acute laryngopharyngeal dysesthesia and more rare occurrences such as diplopia, ataxia, incontinence. She was provided with written information on the drugs as well.  She understands she will need a Port-A-Cath placed. We made a referral to Dr. Zella Richer.  She will return for cycle 1 FOLFOX on 05/17/2014. We will see her prior to cycle 2 on 05/31/2014. She will contact the office in the interim with any problems.  Patient seen with Dr. Benay Spice. 25 minutes were spent face-to-face at today's visit with the majority of that time involved in counseling/coordination of care.    Ned Card ANP/GNP-BC   05/03/2014  2:42 PM  This was a shared visit with Ned Card. We reviewed the PET images with Natalie Chapman and her family. 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 She will not be a candidate for surgery. We discussed supportive care versus a trial of FOLFOX chemotherapy. No therapy will be curative. She would like to proceed with FOLFOX. We reviewed the potential toxicities associated with this regimen and she agrees to proceed.  We will refer Natalie Chapman to Dr. Zella Richer for placement of a Port-A-Cath.  Julieanne Manson, M.D.

## 2014-05-03 NOTE — Telephone Encounter (Signed)
Gave avs & calendar for May. Sent message to schedule treatment. °

## 2014-05-03 NOTE — Patient Instructions (Signed)
Fluorouracil, 5-FU injection What is this medicine? FLUOROURACIL, 5-FU (flure oh YOOR a sil) is a chemotherapy drug. It slows the growth of cancer cells. This medicine is used to treat many types of cancer like breast cancer, colon or rectal cancer, pancreatic cancer, and stomach cancer. This medicine may be used for other purposes; ask your health care provider or pharmacist if you have questions. COMMON BRAND NAME(S): Adrucil What should I tell my health care provider before I take this medicine? They need to know if you have any of these conditions: -blood disorders -dihydropyrimidine dehydrogenase (DPD) deficiency -infection (especially a virus infection such as chickenpox, cold sores, or herpes) -kidney disease -liver disease -malnourished, poor nutrition -recent or ongoing radiation therapy -an unusual or allergic reaction to fluorouracil, other chemotherapy, other medicines, foods, dyes, or preservatives -pregnant or trying to get pregnant -breast-feeding How should I use this medicine? This drug is given as an infusion or injection into a vein. It is administered in a hospital or clinic by a specially trained health care professional. Talk to your pediatrician regarding the use of this medicine in children. Special care may be needed. Overdosage: If you think you have taken too much of this medicine contact a poison control center or emergency room at once. NOTE: This medicine is only for you. Do not share this medicine with others. What if I miss a dose? It is important not to miss your dose. Call your doctor or health care professional if you are unable to keep an appointment. What may interact with this medicine? -allopurinol -cimetidine -dapsone -digoxin -hydroxyurea -leucovorin -levamisole -medicines for seizures like ethotoin, fosphenytoin, phenytoin -medicines to increase blood counts like filgrastim, pegfilgrastim, sargramostim -medicines that treat or prevent blood  clots like warfarin, enoxaparin, and dalteparin -methotrexate -metronidazole -pyrimethamine -some other chemotherapy drugs like busulfan, cisplatin, estramustine, vinblastine -trimethoprim -trimetrexate -vaccines Talk to your doctor or health care professional before taking any of these medicines: -acetaminophen -aspirin -ibuprofen -ketoprofen -naproxen This list may not describe all possible interactions. Give your health care provider a list of all the medicines, herbs, non-prescription drugs, or dietary supplements you use. Also tell them if you smoke, drink alcohol, or use illegal drugs. Some items may interact with your medicine. What should I watch for while using this medicine? Visit your doctor for checks on your progress. This drug may make you feel generally unwell. This is not uncommon, as chemotherapy can affect healthy cells as well as cancer cells. Report any side effects. Continue your course of treatment even though you feel ill unless your doctor tells you to stop. In some cases, you may be given additional medicines to help with side effects. Follow all directions for their use. Call your doctor or health care professional for advice if you get a fever, chills or sore throat, or other symptoms of a cold or flu. Do not treat yourself. This drug decreases your body's ability to fight infections. Try to avoid being around people who are sick. This medicine may increase your risk to bruise or bleed. Call your doctor or health care professional if you notice any unusual bleeding. Be careful brushing and flossing your teeth or using a toothpick because you may get an infection or bleed more easily. If you have any dental work done, tell your dentist you are receiving this medicine. Avoid taking products that contain aspirin, acetaminophen, ibuprofen, naproxen, or ketoprofen unless instructed by your doctor. These medicines may hide a fever. Do not become pregnant while taking this  medicine. Women should inform their doctor if they wish to become pregnant or think they might be pregnant. There is a potential for serious side effects to an unborn child. Talk to your health care professional or pharmacist for more information. Do not breast-feed an infant while taking this medicine. Men should inform their doctor if they wish to father a child. This medicine may lower sperm counts. Do not treat diarrhea with over the counter products. Contact your doctor if you have diarrhea that lasts more than 2 days or if it is severe and watery. This medicine can make you more sensitive to the sun. Keep out of the sun. If you cannot avoid being in the sun, wear protective clothing and use sunscreen. Do not use sun lamps or tanning beds/booths. What side effects may I notice from receiving this medicine? Side effects that you should report to your doctor or health care professional as soon as possible: -allergic reactions like skin rash, itching or hives, swelling of the face, lips, or tongue -low blood counts - this medicine may decrease the number of white blood cells, red blood cells and platelets. You may be at increased risk for infections and bleeding. -signs of infection - fever or chills, cough, sore throat, pain or difficulty passing urine -signs of decreased platelets or bleeding - bruising, pinpoint red spots on the skin, black, tarry stools, blood in the urine -signs of decreased red blood cells - unusually weak or tired, fainting spells, lightheadedness -breathing problems -changes in vision -chest pain -mouth sores -nausea and vomiting -pain, swelling, redness at site where injected -pain, tingling, numbness in the hands or feet -redness, swelling, or sores on hands or feet -stomach pain -unusual bleeding Side effects that usually do not require medical attention (report to your doctor or health care professional if they continue or are bothersome): -changes in finger or  toe nails -diarrhea -dry or itchy skin -hair loss -headache -loss of appetite -sensitivity of eyes to the light -stomach upset -unusually teary eyes This list may not describe all possible side effects. Call your doctor for medical advice about side effects. You may report side effects to FDA at 1-800-FDA-1088. Where should I keep my medicine? This drug is given in a hospital or clinic and will not be stored at home. NOTE: This sheet is a summary. It may not cover all possible information. If you have questions about this medicine, talk to your doctor, pharmacist, or health care provider.  2015, Elsevier/Gold Standard. (2007-05-04 13:53:16) Oxaliplatin Injection What is this medicine? OXALIPLATIN (ox AL i PLA tin) is a chemotherapy drug. It targets fast dividing cells, like cancer cells, and causes these cells to die. This medicine is used to treat cancers of the colon and rectum, and many other cancers. This medicine may be used for other purposes; ask your health care provider or pharmacist if you have questions. COMMON BRAND NAME(S): Eloxatin What should I tell my health care provider before I take this medicine? They need to know if you have any of these conditions: -kidney disease -an unusual or allergic reaction to oxaliplatin, other chemotherapy, other medicines, foods, dyes, or preservatives -pregnant or trying to get pregnant -breast-feeding How should I use this medicine? This drug is given as an infusion into a vein. It is administered in a hospital or clinic by a specially trained health care professional. Talk to your pediatrician regarding the use of this medicine in children. Special care may be needed. Overdosage: If you think you  have taken too much of this medicine contact a poison control center or emergency room at once. NOTE: This medicine is only for you. Do not share this medicine with others. What if I miss a dose? It is important not to miss a dose. Call your  doctor or health care professional if you are unable to keep an appointment. What may interact with this medicine? -medicines to increase blood counts like filgrastim, pegfilgrastim, sargramostim -probenecid -some antibiotics like amikacin, gentamicin, neomycin, polymyxin B, streptomycin, tobramycin -zalcitabine Talk to your doctor or health care professional before taking any of these medicines: -acetaminophen -aspirin -ibuprofen -ketoprofen -naproxen This list may not describe all possible interactions. Give your health care provider a list of all the medicines, herbs, non-prescription drugs, or dietary supplements you use. Also tell them if you smoke, drink alcohol, or use illegal drugs. Some items may interact with your medicine. What should I watch for while using this medicine? Your condition will be monitored carefully while you are receiving this medicine. You will need important blood work done while you are taking this medicine. This medicine can make you more sensitive to cold. Do not drink cold drinks or use ice. Cover exposed skin before coming in contact with cold temperatures or cold objects. When out in cold weather wear warm clothing and cover your mouth and nose to warm the air that goes into your lungs. Tell your doctor if you get sensitive to the cold. This drug may make you feel generally unwell. This is not uncommon, as chemotherapy can affect healthy cells as well as cancer cells. Report any side effects. Continue your course of treatment even though you feel ill unless your doctor tells you to stop. In some cases, you may be given additional medicines to help with side effects. Follow all directions for their use. Call your doctor or health care professional for advice if you get a fever, chills or sore throat, or other symptoms of a cold or flu. Do not treat yourself. This drug decreases your body's ability to fight infections. Try to avoid being around people who are  sick. This medicine may increase your risk to bruise or bleed. Call your doctor or health care professional if you notice any unusual bleeding. Be careful brushing and flossing your teeth or using a toothpick because you may get an infection or bleed more easily. If you have any dental work done, tell your dentist you are receiving this medicine. Avoid taking products that contain aspirin, acetaminophen, ibuprofen, naproxen, or ketoprofen unless instructed by your doctor. These medicines may hide a fever. Do not become pregnant while taking this medicine. Women should inform their doctor if they wish to become pregnant or think they might be pregnant. There is a potential for serious side effects to an unborn child. Talk to your health care professional or pharmacist for more information. Do not breast-feed an infant while taking this medicine. Call your doctor or health care professional if you get diarrhea. Do not treat yourself. What side effects may I notice from receiving this medicine? Side effects that you should report to your doctor or health care professional as soon as possible: -allergic reactions like skin rash, itching or hives, swelling of the face, lips, or tongue -low blood counts - This drug may decrease the number of white blood cells, red blood cells and platelets. You may be at increased risk for infections and bleeding. -signs of infection - fever or chills, cough, sore throat, pain or difficulty  passing urine -signs of decreased platelets or bleeding - bruising, pinpoint red spots on the skin, black, tarry stools, nosebleeds -signs of decreased red blood cells - unusually weak or tired, fainting spells, lightheadedness -breathing problems -chest pain, pressure -cough -diarrhea -jaw tightness -mouth sores -nausea and vomiting -pain, swelling, redness or irritation at the injection site -pain, tingling, numbness in the hands or feet -problems with balance, talking,  walking -redness, blistering, peeling or loosening of the skin, including inside the mouth -trouble passing urine or change in the amount of urine Side effects that usually do not require medical attention (report to your doctor or health care professional if they continue or are bothersome): -changes in vision -constipation -hair loss -loss of appetite -metallic taste in the mouth or changes in taste -stomach pain This list may not describe all possible side effects. Call your doctor for medical advice about side effects. You may report side effects to FDA at 1-800-FDA-1088. Where should I keep my medicine? This drug is given in a hospital or clinic and will not be stored at home. NOTE: This sheet is a summary. It may not cover all possible information. If you have questions about this medicine, talk to your doctor, pharmacist, or health care provider.  2015, Elsevier/Gold Standard. (2007-07-26 17:22:47)

## 2014-05-08 ENCOUNTER — Other Ambulatory Visit: Payer: Self-pay | Admitting: General Surgery

## 2014-05-13 ENCOUNTER — Other Ambulatory Visit: Payer: Self-pay | Admitting: Oncology

## 2014-05-14 ENCOUNTER — Encounter (HOSPITAL_COMMUNITY): Payer: Self-pay | Admitting: *Deleted

## 2014-05-14 MED ORDER — CEFAZOLIN SODIUM-DEXTROSE 2-3 GM-% IV SOLR
2.0000 g | INTRAVENOUS | Status: AC
  Administered 2014-05-15: 2 g via INTRAVENOUS
  Filled 2014-05-14: qty 50

## 2014-05-15 ENCOUNTER — Encounter (HOSPITAL_COMMUNITY): Payer: Self-pay | Admitting: Anesthesiology

## 2014-05-15 ENCOUNTER — Ambulatory Visit (HOSPITAL_COMMUNITY): Payer: Medicare Other

## 2014-05-15 ENCOUNTER — Encounter (HOSPITAL_COMMUNITY)
Admission: RE | Disposition: A | Discharge status: Home / Self Care | Payer: Self-pay | Source: Ambulatory Visit | Attending: General Surgery

## 2014-05-15 ENCOUNTER — Ambulatory Visit (HOSPITAL_COMMUNITY): Payer: Medicare Other | Admitting: Anesthesiology

## 2014-05-15 ENCOUNTER — Ambulatory Visit (HOSPITAL_COMMUNITY)
Admission: RE | Admit: 2014-05-15 | Discharge: 2014-05-15 | Disposition: A | Discharge status: Home / Self Care | Payer: Medicare Other | Source: Ambulatory Visit | Attending: General Surgery | Admitting: General Surgery

## 2014-05-15 DIAGNOSIS — I129 Hypertensive chronic kidney disease with stage 1 through stage 4 chronic kidney disease, or unspecified chronic kidney disease: Secondary | ICD-10-CM | POA: Insufficient documentation

## 2014-05-15 DIAGNOSIS — N181 Chronic kidney disease, stage 1: Secondary | ICD-10-CM | POA: Insufficient documentation

## 2014-05-15 DIAGNOSIS — M1990 Unspecified osteoarthritis, unspecified site: Secondary | ICD-10-CM | POA: Diagnosis not present

## 2014-05-15 DIAGNOSIS — C189 Malignant neoplasm of colon, unspecified: Secondary | ICD-10-CM | POA: Insufficient documentation

## 2014-05-15 DIAGNOSIS — I482 Chronic atrial fibrillation: Secondary | ICD-10-CM | POA: Diagnosis not present

## 2014-05-15 DIAGNOSIS — Z95828 Presence of other vascular implants and grafts: Secondary | ICD-10-CM

## 2014-05-15 DIAGNOSIS — Z419 Encounter for procedure for purposes other than remedying health state, unspecified: Secondary | ICD-10-CM

## 2014-05-15 HISTORY — PX: OPERATIVE ULTRASOUND: SHX5996

## 2014-05-15 HISTORY — PX: PORTACATH PLACEMENT: SHX2246

## 2014-05-15 LAB — CBC WITH DIFFERENTIAL/PLATELET
BASOS PCT: 1 % (ref 0–1)
Basophils Absolute: 0 10*3/uL (ref 0.0–0.1)
EOS ABS: 0.4 10*3/uL (ref 0.0–0.7)
Eosinophils Relative: 5 % (ref 0–5)
HCT: 31.5 % — ABNORMAL LOW (ref 36.0–46.0)
HEMOGLOBIN: 10.2 g/dL — AB (ref 12.0–15.0)
Lymphocytes Relative: 21 % (ref 12–46)
Lymphs Abs: 1.5 10*3/uL (ref 0.7–4.0)
MCH: 30.5 pg (ref 26.0–34.0)
MCHC: 32.4 g/dL (ref 30.0–36.0)
MCV: 94.3 fL (ref 78.0–100.0)
Monocytes Absolute: 0.6 10*3/uL (ref 0.1–1.0)
Monocytes Relative: 8 % (ref 3–12)
NEUTROS ABS: 4.7 10*3/uL (ref 1.7–7.7)
Neutrophils Relative %: 65 % (ref 43–77)
Platelets: 350 10*3/uL (ref 150–400)
RBC: 3.34 MIL/uL — ABNORMAL LOW (ref 3.87–5.11)
RDW: 14.2 % (ref 11.5–15.5)
WBC: 7.2 10*3/uL (ref 4.0–10.5)

## 2014-05-15 LAB — COMPREHENSIVE METABOLIC PANEL
ALT: 12 U/L — ABNORMAL LOW (ref 14–54)
ANION GAP: 14 (ref 5–15)
AST: 22 U/L (ref 15–41)
Albumin: 3.9 g/dL (ref 3.5–5.0)
Alkaline Phosphatase: 71 U/L (ref 38–126)
BILIRUBIN TOTAL: 0.7 mg/dL (ref 0.3–1.2)
BUN: 23 mg/dL — AB (ref 6–20)
CALCIUM: 9.6 mg/dL (ref 8.9–10.3)
CO2: 22 mmol/L (ref 22–32)
CREATININE: 2.09 mg/dL — AB (ref 0.44–1.00)
Chloride: 100 mmol/L — ABNORMAL LOW (ref 101–111)
GFR calc Af Amer: 26 mL/min — ABNORMAL LOW (ref >60)
GFR calc non Af Amer: 22 mL/min — ABNORMAL LOW (ref >60)
Glucose, Bld: 106 mg/dL — ABNORMAL HIGH (ref 70–99)
Potassium: 3.9 mmol/L (ref 3.5–5.1)
SODIUM: 136 mmol/L (ref 135–145)
Total Protein: 8.1 g/dL (ref 6.5–8.1)

## 2014-05-15 LAB — PROTIME-INR
INR: 1.15 (ref 0.00–1.49)
PROTHROMBIN TIME: 14.8 s (ref 11.6–15.2)

## 2014-05-15 SURGERY — INSERTION, TUNNELED CENTRAL VENOUS DEVICE, WITH PORT
Anesthesia: General | Site: Chest | Laterality: Left

## 2014-05-15 MED ORDER — GLYCOPYRROLATE 0.2 MG/ML IJ SOLN
INTRAMUSCULAR | Status: AC
  Filled 2014-05-15: qty 3

## 2014-05-15 MED ORDER — LIDOCAINE HCL (CARDIAC) 20 MG/ML IV SOLN
INTRAVENOUS | Status: AC
  Filled 2014-05-15: qty 5

## 2014-05-15 MED ORDER — LIDOCAINE HCL (CARDIAC) 20 MG/ML IV SOLN
INTRAVENOUS | Status: DC | PRN
  Administered 2014-05-15: 30 mg via INTRAVENOUS

## 2014-05-15 MED ORDER — LACTATED RINGERS IV SOLN: INTRAVENOUS | Status: DC | PRN

## 2014-05-15 MED ORDER — HEPARIN SOD (PORK) LOCK FLUSH 100 UNIT/ML IV SOLN
INTRAVENOUS | Status: AC
  Filled 2014-05-15: qty 5

## 2014-05-15 MED ORDER — PROPOFOL 10 MG/ML IV BOLUS
INTRAVENOUS | Status: DC | PRN
  Administered 2014-05-15: 80 mg via INTRAVENOUS
  Administered 2014-05-15: 20 mg via INTRAVENOUS

## 2014-05-15 MED ORDER — MEPERIDINE HCL 25 MG/ML IJ SOLN: 6.2500 mg | INTRAMUSCULAR | Status: DC | PRN

## 2014-05-15 MED ORDER — ONDANSETRON HCL 4 MG/2ML IJ SOLN
INTRAMUSCULAR | Status: AC
  Filled 2014-05-15: qty 2

## 2014-05-15 MED ORDER — FENTANYL CITRATE (PF) 100 MCG/2ML IJ SOLN: 25.0000 ug | INTRAMUSCULAR | Status: DC | PRN

## 2014-05-15 MED ORDER — FENTANYL CITRATE (PF) 250 MCG/5ML IJ SOLN
INTRAMUSCULAR | Status: AC
  Filled 2014-05-15: qty 5

## 2014-05-15 MED ORDER — HEPARIN SOD (PORK) LOCK FLUSH 100 UNIT/ML IV SOLN
INTRAVENOUS | Status: DC | PRN
  Administered 2014-05-15: 500 [IU] via INTRAVENOUS

## 2014-05-15 MED ORDER — LIDOCAINE HCL (PF) 1 % IJ SOLN
INTRAMUSCULAR | Status: AC
  Filled 2014-05-15: qty 30

## 2014-05-15 MED ORDER — NEOSTIGMINE METHYLSULFATE 10 MG/10ML IV SOLN
INTRAVENOUS | Status: AC
  Filled 2014-05-15: qty 1

## 2014-05-15 MED ORDER — SODIUM CHLORIDE 0.9 % IR SOLN
Status: DC | PRN
  Administered 2014-05-15: 500 mL

## 2014-05-15 MED ORDER — LIDOCAINE HCL (PF) 1 % IJ SOLN
INTRAMUSCULAR | Status: DC | PRN
  Administered 2014-05-15: 30 mL

## 2014-05-15 MED ORDER — PHENYLEPHRINE HCL 10 MG/ML IJ SOLN
INTRAMUSCULAR | Status: DC | PRN
  Administered 2014-05-15: 120 ug via INTRAVENOUS
  Administered 2014-05-15 (×2): 80 ug via INTRAVENOUS

## 2014-05-15 MED ORDER — FENTANYL CITRATE (PF) 100 MCG/2ML IJ SOLN
INTRAMUSCULAR | Status: DC | PRN
  Administered 2014-05-15: 50 ug via INTRAVENOUS
  Administered 2014-05-15: 100 ug via INTRAVENOUS

## 2014-05-15 MED ORDER — SODIUM CHLORIDE 0.9 % IV SOLN
INTRAVENOUS | Status: DC
  Administered 2014-05-15: 08:00:00 via INTRAVENOUS

## 2014-05-15 MED ORDER — MIDAZOLAM HCL 2 MG/2ML IJ SOLN
INTRAMUSCULAR | Status: AC
  Filled 2014-05-15: qty 2

## 2014-05-15 MED ORDER — 0.9 % SODIUM CHLORIDE (POUR BTL) OPTIME
TOPICAL | Status: DC | PRN
  Administered 2014-05-15: 1000 mL

## 2014-05-15 MED ORDER — MIDAZOLAM HCL 2 MG/2ML IJ SOLN: 0.5000 mg | Freq: Once | INTRAMUSCULAR | Status: DC | PRN

## 2014-05-15 MED ORDER — ONDANSETRON HCL 4 MG/2ML IJ SOLN
INTRAMUSCULAR | Status: DC | PRN
  Administered 2014-05-15: 4 mg via INTRAVENOUS

## 2014-05-15 MED ORDER — PROMETHAZINE HCL 25 MG/ML IJ SOLN: 6.2500 mg | INTRAMUSCULAR | Status: DC | PRN

## 2014-05-15 SURGICAL SUPPLY — 47 items
BAG DECANTER FOR FLEXI CONT (MISCELLANEOUS) ×3 IMPLANT
BENZOIN TINCTURE PRP APPL 2/3 (GAUZE/BANDAGES/DRESSINGS) ×3 IMPLANT
BLADE SURG 11 STRL SS (BLADE) ×3 IMPLANT
BLADE SURG 15 STRL LF DISP TIS (BLADE) ×1 IMPLANT
BLADE SURG 15 STRL SS (BLADE) ×2
CHLORAPREP W/TINT 26ML (MISCELLANEOUS) ×3 IMPLANT
CLOSURE STERI-STRIP 1/2X4 (GAUZE/BANDAGES/DRESSINGS) ×1
CLOSURE WOUND 1/4X4 (GAUZE/BANDAGES/DRESSINGS) ×1
CLSR STERI-STRIP ANTIMIC 1/2X4 (GAUZE/BANDAGES/DRESSINGS) ×2 IMPLANT
COVER SURGICAL LIGHT HANDLE (MISCELLANEOUS) ×3 IMPLANT
COVER TRANSDUCER ULTRASND GEL (DRAPE) ×3 IMPLANT
CRADLE DONUT ADULT HEAD (MISCELLANEOUS) ×3 IMPLANT
DRAPE C-ARM 42X72 X-RAY (DRAPES) ×3 IMPLANT
DRAPE LAPAROSCOPIC ABDOMINAL (DRAPES) ×3 IMPLANT
DRAPE UTILITY XL STRL (DRAPES) ×3 IMPLANT
DRSG TEGADERM 2-3/8X2-3/4 SM (GAUZE/BANDAGES/DRESSINGS) ×3 IMPLANT
DRSG TEGADERM 4X4.75 (GAUZE/BANDAGES/DRESSINGS) ×3 IMPLANT
ELECT CAUTERY BLADE 6.4 (BLADE) ×3 IMPLANT
ELECT REM PT RETURN 9FT ADLT (ELECTROSURGICAL) ×3
ELECTRODE REM PT RTRN 9FT ADLT (ELECTROSURGICAL) ×1 IMPLANT
GAUZE SPONGE 2X2 8PLY STRL LF (GAUZE/BANDAGES/DRESSINGS) ×1 IMPLANT
GAUZE SPONGE 4X4 16PLY XRAY LF (GAUZE/BANDAGES/DRESSINGS) ×3 IMPLANT
GEL ULTRASOUND 20GR AQUASONIC (MISCELLANEOUS) ×3 IMPLANT
GLOVE BIOGEL PI IND STRL 8 (GLOVE) ×1 IMPLANT
GLOVE BIOGEL PI INDICATOR 8 (GLOVE) ×2
GLOVE ECLIPSE 8.0 STRL XLNG CF (GLOVE) ×3 IMPLANT
GOWN STRL REUS W/ TWL LRG LVL3 (GOWN DISPOSABLE) ×3 IMPLANT
GOWN STRL REUS W/TWL LRG LVL3 (GOWN DISPOSABLE) ×6
KIT BASIN OR (CUSTOM PROCEDURE TRAY) ×3 IMPLANT
KIT PORT POWER 8FR ISP CVUE (Catheter) ×3 IMPLANT
KIT ROOM TURNOVER OR (KITS) ×3 IMPLANT
NEEDLE 22X1 1/2 (OR ONLY) (NEEDLE) ×3 IMPLANT
NEEDLE HYPO 25GX1X1/2 BEV (NEEDLE) ×3 IMPLANT
NS IRRIG 1000ML POUR BTL (IV SOLUTION) ×3 IMPLANT
PACK SURGICAL SETUP 50X90 (CUSTOM PROCEDURE TRAY) ×3 IMPLANT
PAD ARMBOARD 7.5X6 YLW CONV (MISCELLANEOUS) ×6 IMPLANT
PENCIL BUTTON HOLSTER BLD 10FT (ELECTRODE) ×3 IMPLANT
SPONGE GAUZE 2X2 STER 10/PKG (GAUZE/BANDAGES/DRESSINGS) ×2
STRIP CLOSURE SKIN 1/4X4 (GAUZE/BANDAGES/DRESSINGS) ×2 IMPLANT
SUT MON AB 4-0 PC3 18 (SUTURE) ×3 IMPLANT
SUT VIC AB 2-0 SH 18 (SUTURE) ×3 IMPLANT
SUT VIC AB 3-0 SH 27 (SUTURE) ×2
SUT VIC AB 3-0 SH 27X BRD (SUTURE) ×1 IMPLANT
SYR 20ML ECCENTRIC (SYRINGE) ×6 IMPLANT
SYR 5ML LUER SLIP (SYRINGE) ×3 IMPLANT
SYR CONTROL 10ML LL (SYRINGE) ×3 IMPLANT
TOWEL OR 17X26 10 PK STRL BLUE (TOWEL DISPOSABLE) ×3 IMPLANT

## 2014-05-15 NOTE — Anesthesia Postprocedure Evaluation (Signed)
  Anesthesia Post-op Note  Patient: Natalie Chapman  Procedure(s) Performed: Procedure(s): INSERTION PORT-A-CATH WITH ULTRASOUND (Left) OPERATIVE ULTRASOUND (Left)  Patient Location: PACU  Anesthesia Type:General  Level of Consciousness: awake, alert , oriented, patient cooperative and responds to stimulation  Airway and Oxygen Therapy: Patient Spontanous Breathing  Post-op Pain: none  Post-op Assessment: Post-op Vital signs reviewed, Patient's Cardiovascular Status Stable, Respiratory Function Stable, Patent Airway, No signs of Nausea or vomiting and Pain level controlled  Post-op Vital Signs: Reviewed and stable  Last Vitals:  Filed Vitals:   05/15/14 1213  BP: 120/69  Pulse: 74  Temp:   Resp: 18    Complications: No apparent anesthesia complications

## 2014-05-15 NOTE — Discharge Instructions (Addendum)
° ° °  PORT-A-CATH: POST OP INSTRUCTIONS  Always review your discharge instruction sheet given to you by the facility where your surgery was performed.   1. A prescription for pain medication may be given to you upon discharge. Take your pain medication as prescribed, if needed. If narcotic pain medicine is not needed, then you make take acetaminophen (Tylenol) or ibuprofen (Advil) as needed.  2. Take your usually prescribed medications unless otherwise directed. 3. If you need a refill on your pain medication, please contact our office. All narcotic pain medicine now requires a paper prescription.  Phoned in and fax refills are no longer allowed by law.  Prescriptions will not be filled after 5 pm or on weekends.  4. You should follow a light diet for the remainder of the day after your procedure. 5. Most patients will experience some mild swelling and/or bruising in the area of the incision. It may take several days to resolve. 6. It is common to experience some constipation if taking pain medication after surgery. Increasing fluid intake and taking a stool softener (such as Colace) will usually help or prevent this problem from occurring. A mild laxative (Milk of Magnesia or Miralax) should be taken according to package directions if there are no bowel movements after 48 hours.  7. Unless discharge instructions indicate otherwise, you may remove your bandages 72 hours after surgery, and you may shower at that time. You may have steri-strips (small white skin tapes) in place directly over the incision.  These strips should be left on the skin until they fall off.  If your surgeon used Dermabond (skin glue) on the incision, you may shower in 24 hours.  The glue will flake off over the next 2-3 weeks.  8. If your port is left accessed at the end of surgery (needle left in port), the dressing cannot get wet and should only by changed by a healthcare professional. When the port is no longer accessed (when  the needle has been removed), follow step 7.   9. ACTIVITIES:  Limit activity involving your right arm for the next week. Do no strenuous exercise or activity for 1 week. You may drive when you are no longer taking prescription pain medication, you can comfortably wear a seatbelt, and you can maneuver your car. 10.You may need to see your doctor in the office for a follow-up appointment.  Please       check with your doctor.  11.When you receive a new Port-a-Cath, you will get a product guide and        ID card.  Please keep them in case you need them.  WHEN TO CALL YOUR DOCTOR [phone]): 1. Fever over 101.0 2. Chills 3. Continued bleeding from incision 4. Increased redness and tenderness at the site 5. Shortness of breath, difficulty breathing   The clinic staff is available to answer your questions during regular business hours. Please dont hesitate to call and ask to speak to one of the nurses or medical assistants for clinical concerns. If you have a medical emergency, go to the nearest emergency room or call 911.  A surgeon from [facility] is always on call at the hospital.     For further information, please visit [email or web address]

## 2014-05-15 NOTE — H&P (Signed)
Natalie Chapman is an 74 y.o. female.   Chief Complaint: Here for elective Port-a-cath placement HPI:   She has metastatic colon cancer and needs long-term venous access for chemotherapy.  Her first treatment is in 2 days.   Past Medical History  Diagnosis Date  . Hypertension   . Anemia   . H/O hiatal hernia   . Chronic atrial fibrillation     CARDIOLOGIST-- DR Johnsie Cancel  . Arthritis     LEFT HIP  . Generalized weakness   . Colon cancer dx mar  2015    Stage IIIc, pT4a  pN2b, moderate differentiated adenocarcinoma right colon with 8of15 +lymph nodes for metastatic carcinoma--  s/p  right colectomy 07-20-2013;  chemotherapy 09-05-2013 to 01-30-2014  . 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 Poor appetite   . Frequency of urination   . Wears glasses   . Full dentures   . Dysrhythmia     AFIB  . Chronic kidney disease (CKD), stage I   . Hydronephrosis, right     Past Surgical History  Procedure Laterality Date  . Colonoscopy N/A 05/02/2013    Procedure: COLONOSCOPY;  Surgeon: Jeryl Columbia, MD;  Location: [facility];  Service: Endoscopy;  Laterality: N/A;  . Esophagogastroduodenoscopy endoscopy    . Incisional hernia repair  1980's  . Laparoscopic partial colectomy N/A 07/20/2013    Procedure: LAPAROSCOPIC  ASSISTED RIGHT COLECTOMY;  Surgeon: Odis Hollingshead, MD;  Location: [facility];  Service: General;  Laterality: N/A;  . Gastric restriction surgery  [phone]   [facility]  . Transthoracic echocardiogram  06-20-2013   dr Johnsie Cancel    severe LAE/  ef 55-60%/  mild MR and PR/  moderate TR/  moderate AV calcification without stenosis  . Cholecystectomy  1980's  . Tubal ligation  1973  . Tonsillectomy  as child  . Cystoscopy w/ ureteral stent placement Right 04/27/2014    Procedure: CYSTOSCOPY WITH RIGHT URETERAL STENT PLACEMENT;  Surgeon: Ardis Hughs, MD;  Location: [facility];  Service: Urology;  Laterality: Right;    Family History  Problem Relation Age of Onset  . Heart  disease Mother   . Heart disease Father    Social History:  reports that she quit smoking about 24 years ago. Her smoking use included Cigarettes. She has a 30 pack-year smoking history. She has never used smokeless tobacco. She reports that she does not drink alcohol or use illicit drugs.  Allergies: No Known Allergies  Medications Prior to Admission  Medication Sig Dispense Refill  . acetaminophen (TYLENOL) 650 MG CR tablet Take 1,300 mg by mouth every 8 (eight) hours as needed for pain.     Marland Kitchen aspirin EC 81 MG tablet Take 81 mg by mouth daily.    Marland Kitchen atorvastatin (LIPITOR) 10 MG tablet Take 5 mg by mouth daily with breakfast.     . docusate sodium (COLACE) 100 MG capsule Take 100 mg by mouth daily as needed for mild constipation.    . enalapril (VASOTEC) 2.5 MG tablet Take 2.5 mg by mouth every morning.     . lidocaine-prilocaine (EMLA) cream Apply 1 application topically as needed. APPLY TO PORTACATH 1-2 HOURS PRIOR TO USE 30 g 0  . metoprolol succinate (TOPROL-XL) 25 MG 24 hr tablet Take 25 mg by mouth every morning.     . Multiple Vitamins-Iron (MULTIVITAMIN/IRON PO) Take 1 tablet by mouth daily.    . polyethylene glycol (MIRALAX / GLYCOLAX) packet Take 17 g by mouth  daily as needed.     . prochlorperazine (COMPAZINE) 5 MG tablet Take 1 tablet (5 mg total) by mouth every 6 (six) hours as needed for nausea or vomiting. 30 tablet 3  . acetaminophen-codeine (TYLENOL #3) 300-30 MG per tablet Take 1 tablet by mouth every 4 (four) hours as needed. (Patient not taking: Reported on 05/03/2014) 20 tablet 0  . phenazopyridine (PYRIDIUM) 200 MG tablet Take 1 tablet (200 mg total) by mouth 3 (three) times daily as needed for pain. (Patient not taking: Reported on 05/03/2014) 10 tablet 0    Results for orders placed or performed during the hospital encounter of 05/15/14 (from the past 48 hour(s))  CBC WITH DIFFERENTIAL     Status: Abnormal   Collection Time: 05/15/14  7:24 AM  Result Value Ref Range    WBC 7.2 4.0 - 10.5 K/uL   RBC 3.34 (L) 3.87 - 5.11 MIL/uL   Hemoglobin 10.2 (L) 12.0 - 15.0 g/dL   HCT 31.5 (L) 36.0 - 46.0 %   MCV 94.3 78.0 - 100.0 fL   MCH 30.5 26.0 - 34.0 pg   MCHC 32.4 30.0 - 36.0 g/dL   RDW 14.2 11.5 - 15.5 %   Platelets 350 150 - 400 K/uL   Neutrophils Relative % 65 43 - 77 %   Neutro Abs 4.7 1.7 - 7.7 K/uL   Lymphocytes Relative 21 12 - 46 %   Lymphs Abs 1.5 0.7 - 4.0 K/uL   Monocytes Relative 8 3 - 12 %   Monocytes Absolute 0.6 0.1 - 1.0 K/uL   Eosinophils Relative 5 0 - 5 %   Eosinophils Absolute 0.4 0.0 - 0.7 K/uL   Basophils Relative 1 0 - 1 %   Basophils Absolute 0.0 0.0 - 0.1 K/uL  Comprehensive metabolic panel     Status: Abnormal   Collection Time: 05/15/14  7:24 AM  Result Value Ref Range   Sodium 136 135 - 145 mmol/L   Potassium 3.9 3.5 - 5.1 mmol/L   Chloride 100 (L) 101 - 111 mmol/L   CO2 22 22 - 32 mmol/L   Glucose, Bld 106 (H) 70 - 99 mg/dL   BUN 23 (H) 6 - 20 mg/dL   Creatinine, Ser 2.09 (H) 0.44 - 1.00 mg/dL   Calcium 9.6 8.9 - 10.3 mg/dL   Total Protein 8.1 6.5 - 8.1 g/dL   Albumin 3.9 3.5 - 5.0 g/dL   AST 22 15 - 41 U/L   ALT 12 (L) 14 - 54 U/L   Alkaline Phosphatase 71 38 - 126 U/L   Total Bilirubin 0.7 0.3 - 1.2 mg/dL   GFR calc non Af Amer 22 (L) >60 mL/min   GFR calc Af Amer 26 (L) >60 mL/min    Comment: (NOTE) The eGFR has been calculated using the CKD EPI equation. This calculation has not been validated in all clinical situations. eGFR's persistently <90 mL/min signify possible Chronic Kidney Disease.    Anion gap 14 5 - 15  Protime-INR     Status: None   Collection Time: 05/15/14  7:24 AM  Result Value Ref Range   Prothrombin Time 14.8 11.6 - 15.2 seconds   INR 1.15 0.00 - 1.49   No results found.  ROS  Blood pressure 123/45, pulse 90, temperature 97.7 F (36.5 C), SpO2 100 %. Physical Exam  Constitutional: No distress.  Overweight female.    HENT:  Head: Normocephalic and atraumatic.  Respiratory:  Effort normal and breath sounds normal.  GI: Soft. There is tenderness.  Right midabdominal scar with nodule present.  Skin: Skin is warm and dry. There is pallor.     Assessment/Plan Metastatic colon cancer  Plan:  US guided PAC insertion.  .The procedure risks and aftercare been explained. Risks include but are not limited to bleeding, infection, malfunction, pneumothorax, wound problems, DVT.  Trayden Brandy J 05/15/2014, 9:17 AM

## 2014-05-15 NOTE — Addendum Note (Signed)
Addendum  created 05/15/14 1312 by Jenne Campus, CRNA   Modules edited: Anesthesia Attestations

## 2014-05-15 NOTE — Op Note (Signed)
Preoperative diagnosis:  Metastatic colon cancer  Postoperative diagnosis:  same  Procedure: Ultrasound-guided Port-A-Cath insertion into right internal jugular vein under fluoroscopy.  Surgeon: Jackolyn Confer M.D.  Anesthesia: Local (Xylocaine) with General/LMA.  Indication:  This is a 75 year old female with metastatic colon cancer who needs long-term venous access for chemotherapy.  She presents now for Port-a-cath insertion.  Technique: She was brought to the operating room, placed supine on the operating table, and intravenous sedation was given. A roll was placed under the back and the arms were tucked. Hair on the chest wall was clipped as was necessary. The upper chest wall and neck were sterilely prepped and draped.  Her head was rotated to the left. Using the ultrasound, the right internal jugular vein was identified. Local anesthetic was infiltrated in the skin and subcutaneous tissue just anterior to it. A 16-gauge needle was used to cannulate the right internal jugular vein under ultrasound guidance. A wire was then threaded through the needle into the internal jugular vein and down into the right heart under ultrasound and fluoroscopic guidance.  Local anesthetic was infiltrated into the right upper chest wall. A right upper chest wall incision was made and a pocket was created for the Portacath.  An incision was made around the wire in the neck. The catheter was then tunneled from the chest wall incision up through the neck incision.  A dilator- introducer complex was placed over the wire into the superior vena cava. The dilator and wire were then removed and the catheter was threaded through the peel-away sheath introducer into the right heart. The introducer was then peeled away and removed. Under fluoroscopic guidance, the tip of the catheter was then pulled back until it was at the junction of the superior vena cava and right atrium. The catheter was then connected to the port.   The port aspirated blood and flushed easily.  The port was then anchored to the chest wall with 2-0 Vicryl suture. Concentrated heparin solution was then placed into the port. The port and catheter position were then verified using fluoroscopy. The subcutaneous tissue was then closed over the port with running 3-0 Vicryl suture. The skin incisions were then closed with 4-0 Monocryl subcuticular stitches. Steri-Strips and sterile dressings were applied.  The procedure was well-tolerated without any apparent complications. She was taken to the recovery room in satisfactory condition where a portable chest x-ray is pending.

## 2014-05-15 NOTE — Anesthesia Preprocedure Evaluation (Addendum)
Anesthesia Evaluation  Patient identified by MRN, date of birth, ID band Patient awake    Reviewed: Allergy & Precautions, NPO status , Patient's Chart, lab work & pertinent test results, reviewed documented beta blocker date and time   History of Anesthesia Complications Negative for: history of anesthetic complications  Airway Mallampati: II  TM Distance: >3 FB Neck ROM: Full    Dental  (+) Edentulous Upper, Edentulous Lower, Dental Advisory Given   Pulmonary former smoker (quit 1992),  breath sounds clear to auscultation        Cardiovascular hypertension, Pt. on medications and Pt. on home beta blockers - angina+ dysrhythmias Atrial Fibrillation Rhythm:Irregular Rate:Normal  3/16 EF 55-60% mild MR   Neuro/Psych negative neurological ROS     GI/Hepatic Neg liver ROS, hiatal hernia, Colon cancer   Endo/Other  Morbid obesity  Renal/GU Renal InsufficiencyRenal disease (creat 2.09)     Musculoskeletal  (+) Arthritis -,   Abdominal (+) + obese,   Peds  Hematology   Anesthesia Other Findings   Reproductive/Obstetrics                         Anesthesia Physical Anesthesia Plan  ASA: III  Anesthesia Plan: General   Post-op Pain Management:    Induction: Intravenous  Airway Management Planned: LMA  Additional Equipment:   Intra-op Plan:   Post-operative Plan:   Informed Consent: I have reviewed the patients History and Physical, chart, labs and discussed the procedure including the risks, benefits and alternatives for the proposed anesthesia with the patient or authorized representative who has indicated his/her understanding and acceptance.   Dental advisory given  Plan Discussed with: CRNA and Surgeon  Anesthesia Plan Comments: (Plan routine monitors, GA- LMA OK)        Anesthesia Quick Evaluation

## 2014-05-15 NOTE — Transfer of Care (Signed)
Immediate Anesthesia Transfer of Care Note  Patient: Natalie Chapman  Procedure(s) Performed: Procedure(s): INSERTION PORT-A-CATH WITH ULTRASOUND (Left) OPERATIVE ULTRASOUND (Left)  Patient Location: PACU  Anesthesia Type:General  Level of Consciousness: awake, oriented and patient cooperative  Airway & Oxygen Therapy: Patient Spontanous Breathing and Patient connected to nasal cannula oxygen  Post-op Assessment: Report given to RN and Post -op Vital signs reviewed and stable  Post vital signs: Reviewed  Last Vitals:  Filed Vitals:   05/15/14 0751  BP:   Pulse:   Temp: 35.2 C    Complications: No apparent anesthesia complications

## 2014-05-15 NOTE — Anesthesia Procedure Notes (Signed)
Procedure Name: LMA Insertion Date/Time: 05/15/2014 9:40 AM Performed by: Jenne Campus Pre-anesthesia Checklist: Patient identified, Emergency Drugs available, Suction available, Patient being monitored and Timeout performed Patient Re-evaluated:Patient Re-evaluated prior to inductionOxygen Delivery Method: Circle system utilized Preoxygenation: Pre-oxygenation with 100% oxygen Intubation Type: IV induction Ventilation: Mask ventilation without difficulty LMA: LMA inserted LMA Size: 4.0 Number of attempts: 1 Placement Confirmation: positive ETCO2,  CO2 detector and breath sounds checked- equal and bilateral Tube secured with: Tape Dental Injury: Teeth and Oropharynx as per pre-operative assessment

## 2014-05-16 ENCOUNTER — Encounter (HOSPITAL_COMMUNITY): Payer: Self-pay | Admitting: General Surgery

## 2014-05-17 ENCOUNTER — Other Ambulatory Visit (HOSPITAL_BASED_OUTPATIENT_CLINIC_OR_DEPARTMENT_OTHER): Payer: Medicare Other

## 2014-05-17 ENCOUNTER — Ambulatory Visit (HOSPITAL_BASED_OUTPATIENT_CLINIC_OR_DEPARTMENT_OTHER): Payer: Medicare Other

## 2014-05-17 VITALS — BP 130/55 | HR 55 | Temp 97.7°F | Resp 18

## 2014-05-17 DIAGNOSIS — C189 Malignant neoplasm of colon, unspecified: Secondary | ICD-10-CM

## 2014-05-17 DIAGNOSIS — C786 Secondary malignant neoplasm of retroperitoneum and peritoneum: Secondary | ICD-10-CM

## 2014-05-17 DIAGNOSIS — C182 Malignant neoplasm of ascending colon: Secondary | ICD-10-CM | POA: Diagnosis present

## 2014-05-17 DIAGNOSIS — Z5111 Encounter for antineoplastic chemotherapy: Secondary | ICD-10-CM | POA: Diagnosis present

## 2014-05-17 LAB — COMPREHENSIVE METABOLIC PANEL (CC13)
ALK PHOS: 67 U/L (ref 40–150)
ALT: 7 U/L (ref 0–55)
AST: 13 U/L (ref 5–34)
Albumin: 3.5 g/dL (ref 3.5–5.0)
Anion Gap: 12 mEq/L — ABNORMAL HIGH (ref 3–11)
BILIRUBIN TOTAL: 0.6 mg/dL (ref 0.20–1.20)
BUN: 27.1 mg/dL — ABNORMAL HIGH (ref 7.0–26.0)
CO2: 22 mEq/L (ref 22–29)
Calcium: 9.4 mg/dL (ref 8.4–10.4)
Chloride: 105 mEq/L (ref 98–109)
Creatinine: 2 mg/dL — ABNORMAL HIGH (ref 0.6–1.1)
EGFR: 24 mL/min/{1.73_m2} — ABNORMAL LOW (ref >90)
GLUCOSE: 100 mg/dL (ref 70–140)
Potassium: 4.7 mEq/L (ref 3.5–5.1)
Sodium: 140 mEq/L (ref 136–145)
Total Protein: 7.4 g/dL (ref 6.4–8.3)

## 2014-05-17 LAB — CBC WITH DIFFERENTIAL/PLATELET
BASO%: 0.7 % (ref 0.0–2.0)
Basophils Absolute: 0 10*3/uL (ref 0.0–0.1)
EOS%: 5.1 % (ref 0.0–7.0)
Eosinophils Absolute: 0.2 10*3/uL (ref 0.0–0.5)
HEMATOCRIT: 27.7 % — AB (ref 34.8–46.6)
HGB: 8.9 g/dL — ABNORMAL LOW (ref 11.6–15.9)
LYMPH%: 11.9 % — ABNORMAL LOW (ref 14.0–49.7)
MCH: 30.7 pg (ref 25.1–34.0)
MCHC: 32.1 g/dL (ref 31.5–36.0)
MCV: 95.5 fL (ref 79.5–101.0)
MONO#: 0.4 10*3/uL (ref 0.1–0.9)
MONO%: 9.5 % (ref 0.0–14.0)
NEUT#: 3.3 10*3/uL (ref 1.5–6.5)
NEUT%: 72.8 % (ref 38.4–76.8)
Platelets: 212 10*3/uL (ref 145–400)
RBC: 2.9 10*6/uL — ABNORMAL LOW (ref 3.70–5.45)
RDW: 14.2 % (ref 11.2–14.5)
WBC: 4.5 10*3/uL (ref 3.9–10.3)
lymph#: 0.5 10*3/uL — ABNORMAL LOW (ref 0.9–3.3)

## 2014-05-17 LAB — CEA: CEA: 25.8 ng/mL — ABNORMAL HIGH (ref 0.0–5.0)

## 2014-05-17 MED ORDER — OXALIPLATIN CHEMO INJECTION 100 MG/20ML
85.0000 mg/m2 | Freq: Once | INTRAVENOUS | Status: AC
  Administered 2014-05-17: 180 mg via INTRAVENOUS
  Filled 2014-05-17: qty 36

## 2014-05-17 MED ORDER — LEUCOVORIN CALCIUM INJECTION 350 MG
400.0000 mg/m2 | Freq: Once | INTRAVENOUS | Status: AC
  Administered 2014-05-17: 844 mg via INTRAVENOUS
  Filled 2014-05-17: qty 42.2

## 2014-05-17 MED ORDER — DEXTROSE 5 % IV SOLN
Freq: Once | INTRAVENOUS | Status: AC
  Administered 2014-05-17: 10:00:00 via INTRAVENOUS

## 2014-05-17 MED ORDER — SODIUM CHLORIDE 0.9 % IV SOLN
Freq: Once | INTRAVENOUS | Status: AC
  Administered 2014-05-17: 11:00:00 via INTRAVENOUS
  Filled 2014-05-17: qty 4

## 2014-05-17 MED ORDER — FLUOROURACIL CHEMO INJECTION 5 GM/100ML
2380.0000 mg/m2 | INTRAVENOUS | Status: DC
  Administered 2014-05-17: 5000 mg via INTRAVENOUS
  Filled 2014-05-17: qty 100

## 2014-05-17 MED ORDER — FLUOROURACIL CHEMO INJECTION 2.5 GM/50ML
400.0000 mg/m2 | Freq: Once | INTRAVENOUS | Status: AC
  Administered 2014-05-17: 850 mg via INTRAVENOUS
  Filled 2014-05-17: qty 17

## 2014-05-17 MED ORDER — SODIUM CHLORIDE 0.9 % IV SOLN: 2400.0000 mg/m2 | INTRAVENOUS | Status: DC

## 2014-05-17 NOTE — Patient Instructions (Signed)
[facility] Discharge Instructions for Patients Receiving Chemotherapy  Today you received the following chemotherapy agents 5 Fu/Oxaliplatin/Leucovorin To help prevent nausea and vomiting after your treatment, we encourage you to take your nausea medication as prescribed.  If you develop nausea and vomiting that is not controlled by your nausea medication, call the clinic.   BELOW ARE SYMPTOMS THAT SHOULD BE REPORTED IMMEDIATELY:  *FEVER GREATER THAN 100.5 F  *CHILLS WITH OR WITHOUT FEVER  NAUSEA AND VOMITING THAT IS NOT CONTROLLED WITH YOUR NAUSEA MEDICATION  *UNUSUAL SHORTNESS OF BREATH  *UNUSUAL BRUISING OR BLEEDING  TENDERNESS IN MOUTH AND THROAT WITH OR WITHOUT PRESENCE OF ULCERS  *URINARY PROBLEMS  *BOWEL PROBLEMS  UNUSUAL RASH Items with * indicate a potential emergency and should be followed up as soon as possible.  Feel free to call the clinic you have any questions or concerns. The clinic phone number is [phone].  Please show the [facility] at check-in to the Emergency Department and triage nurse.  Leucovorin injection What is this medicine? LEUCOVORIN (loo koe VOR in) is used to prevent or treat the harmful effects of some medicines. This medicine is used to treat anemia caused by a low amount of folic acid in the body. It is also used with 5-fluorouracil (5-FU) to treat colon cancer. This medicine may be used for other purposes; ask your health care provider or pharmacist if you have questions. What should I tell my health care provider before I take this medicine? They need to know if you have any of these conditions: -anemia from low levels of vitamin B-12 in the blood -an unusual or allergic reaction to leucovorin, folic acid, other medicines, foods, dyes, or preservatives -pregnant or trying to get pregnant -breast-feeding How should I use this medicine? This medicine is for injection into a muscle or into a vein. It is given by a  health care professional in a hospital or clinic setting. Talk to your pediatrician regarding the use of this medicine in children. Special care may be needed. Overdosage: If you think you have taken too much of this medicine contact a poison control center or emergency room at once. NOTE: This medicine is only for you. Do not share this medicine with others. What if I miss a dose? This does not apply. What may interact with this medicine? -capecitabine -fluorouracil -phenobarbital -phenytoin -primidone -trimethoprim-sulfamethoxazole This list may not describe all possible interactions. Give your health care provider a list of all the medicines, herbs, non-prescription drugs, or dietary supplements you use. Also tell them if you smoke, drink alcohol, or use illegal drugs. Some items may interact with your medicine. What should I watch for while using this medicine? Your condition will be monitored carefully while you are receiving this medicine. This medicine may increase the side effects of 5-fluorouracil, 5-FU. Tell your doctor or health care professional if you have diarrhea or mouth sores that do not get better or that get worse. What side effects may I notice from receiving this medicine? Side effects that you should report to your doctor or health care professional as soon as possible: -allergic reactions like skin rash, itching or hives, swelling of the face, lips, or tongue -breathing problems -fever, infection -mouth sores -unusual bleeding or bruising -unusually weak or tired Side effects that usually do not require medical attention (report to your doctor or health care professional if they continue or are bothersome): -constipation or diarrhea -loss of appetite -nausea, vomiting This list may  not describe all possible side effects. Call your doctor for medical advice about side effects. You may report side effects to FDA at 1-800-FDA-1088. Where should I keep my  medicine? This drug is given in a hospital or clinic and will not be stored at home. NOTE: This sheet is a summary. It may not cover all possible information. If you have questions about this medicine, talk to your doctor, pharmacist, or health care provider.  2015, Elsevier/Gold Standard. (2007-07-05 16:50:29)   Oxaliplatin Injection What is this medicine? OXALIPLATIN (ox AL i PLA tin) is a chemotherapy drug. It targets fast dividing cells, like cancer cells, and causes these cells to die. This medicine is used to treat cancers of the colon and rectum, and many other cancers. This medicine may be used for other purposes; ask your health care provider or pharmacist if you have questions. COMMON BRAND NAME(S): Eloxatin What should I tell my health care provider before I take this medicine? They need to know if you have any of these conditions: -kidney disease -an unusual or allergic reaction to oxaliplatin, other chemotherapy, other medicines, foods, dyes, or preservatives -pregnant or trying to get pregnant -breast-feeding How should I use this medicine? This drug is given as an infusion into a vein. It is administered in a hospital or clinic by a specially trained health care professional. Talk to your pediatrician regarding the use of this medicine in children. Special care may be needed. Overdosage: If you think you have taken too much of this medicine contact a poison control center or emergency room at once. NOTE: This medicine is only for you. Do not share this medicine with others. What if I miss a dose? It is important not to miss a dose. Call your doctor or health care professional if you are unable to keep an appointment. What may interact with this medicine? -medicines to increase blood counts like filgrastim, pegfilgrastim, sargramostim -probenecid -some antibiotics like amikacin, gentamicin, neomycin, polymyxin B, streptomycin, tobramycin -zalcitabine Talk to your doctor or  health care professional before taking any of these medicines: -acetaminophen -aspirin -ibuprofen -ketoprofen -naproxen This list may not describe all possible interactions. Give your health care provider a list of all the medicines, herbs, non-prescription drugs, or dietary supplements you use. Also tell them if you smoke, drink alcohol, or use illegal drugs. Some items may interact with your medicine. What should I watch for while using this medicine? Your condition will be monitored carefully while you are receiving this medicine. You will need important blood work done while you are taking this medicine. This medicine can make you more sensitive to cold. Do not drink cold drinks or use ice. Cover exposed skin before coming in contact with cold temperatures or cold objects. When out in cold weather wear warm clothing and cover your mouth and nose to warm the air that goes into your lungs. Tell your doctor if you get sensitive to the cold. This drug may make you feel generally unwell. This is not uncommon, as chemotherapy can affect healthy cells as well as cancer cells. Report any side effects. Continue your course of treatment even though you feel ill unless your doctor tells you to stop. In some cases, you may be given additional medicines to help with side effects. Follow all directions for their use. Call your doctor or health care professional for advice if you get a fever, chills or sore throat, or other symptoms of a cold or flu. Do not treat yourself. This drug  decreases your body's ability to fight infections. Try to avoid being around people who are sick. This medicine may increase your risk to bruise or bleed. Call your doctor or health care professional if you notice any unusual bleeding. Be careful brushing and flossing your teeth or using a toothpick because you may get an infection or bleed more easily. If you have any dental work done, tell your dentist you are receiving this  medicine. Avoid taking products that contain aspirin, acetaminophen, ibuprofen, naproxen, or ketoprofen unless instructed by your doctor. These medicines may hide a fever. Do not become pregnant while taking this medicine. Women should inform their doctor if they wish to become pregnant or think they might be pregnant. There is a potential for serious side effects to an unborn child. Talk to your health care professional or pharmacist for more information. Do not breast-feed an infant while taking this medicine. Call your doctor or health care professional if you get diarrhea. Do not treat yourself. What side effects may I notice from receiving this medicine? Side effects that you should report to your doctor or health care professional as soon as possible: -allergic reactions like skin rash, itching or hives, swelling of the face, lips, or tongue -low blood counts - This drug may decrease the number of white blood cells, red blood cells and platelets. You may be at increased risk for infections and bleeding. -signs of infection - fever or chills, cough, sore throat, pain or difficulty passing urine -signs of decreased platelets or bleeding - bruising, pinpoint red spots on the skin, black, tarry stools, nosebleeds -signs of decreased red blood cells - unusually weak or tired, fainting spells, lightheadedness -breathing problems -chest pain, pressure -cough -diarrhea -jaw tightness -mouth sores -nausea and vomiting -pain, swelling, redness or irritation at the injection site -pain, tingling, numbness in the hands or feet -problems with balance, talking, walking -redness, blistering, peeling or loosening of the skin, including inside the mouth -trouble passing urine or change in the amount of urine Side effects that usually do not require medical attention (report to your doctor or health care professional if they continue or are bothersome): -changes in vision -constipation -hair  loss -loss of appetite -metallic taste in the mouth or changes in taste -stomach pain This list may not describe all possible side effects. Call your doctor for medical advice about side effects. You may report side effects to FDA at 1-800-FDA-1088. Where should I keep my medicine? This drug is given in a hospital or clinic and will not be stored at home. NOTE: This sheet is a summary. It may not cover all possible information. If you have questions about this medicine, talk to your doctor, pharmacist, or health care provider.  2015, Elsevier/Gold Standard. (2007-07-26 17:22:47)  Fluorouracil, 5-FU injection What is this medicine? FLUOROURACIL, 5-FU (flure oh YOOR a sil) is a chemotherapy drug. It slows the growth of cancer cells. This medicine is used to treat many types of cancer like breast cancer, colon or rectal cancer, pancreatic cancer, and stomach cancer. This medicine may be used for other purposes; ask your health care provider or pharmacist if you have questions. COMMON BRAND NAME(S): Adrucil What should I tell my health care provider before I take this medicine? They need to know if you have any of these conditions: -blood disorders -dihydropyrimidine dehydrogenase (DPD) deficiency -infection (especially a virus infection such as chickenpox, cold sores, or herpes) -kidney disease -liver disease -malnourished, poor nutrition -recent or ongoing radiation therapy -an  unusual or allergic reaction to fluorouracil, other chemotherapy, other medicines, foods, dyes, or preservatives -pregnant or trying to get pregnant -breast-feeding How should I use this medicine? This drug is given as an infusion or injection into a vein. It is administered in a hospital or clinic by a specially trained health care professional. Talk to your pediatrician regarding the use of this medicine in children. Special care may be needed. Overdosage: If you think you have taken too much of this medicine  contact a poison control center or emergency room at once. NOTE: This medicine is only for you. Do not share this medicine with others. What if I miss a dose? It is important not to miss your dose. Call your doctor or health care professional if you are unable to keep an appointment. What may interact with this medicine? -allopurinol -cimetidine -dapsone -digoxin -hydroxyurea -leucovorin -levamisole -medicines for seizures like ethotoin, fosphenytoin, phenytoin -medicines to increase blood counts like filgrastim, pegfilgrastim, sargramostim -medicines that treat or prevent blood clots like warfarin, enoxaparin, and dalteparin -methotrexate -metronidazole -pyrimethamine -some other chemotherapy drugs like busulfan, cisplatin, estramustine, vinblastine -trimethoprim -trimetrexate -vaccines Talk to your doctor or health care professional before taking any of these medicines: -acetaminophen -aspirin -ibuprofen -ketoprofen -naproxen This list may not describe all possible interactions. Give your health care provider a list of all the medicines, herbs, non-prescription drugs, or dietary supplements you use. Also tell them if you smoke, drink alcohol, or use illegal drugs. Some items may interact with your medicine. What should I watch for while using this medicine? Visit your doctor for checks on your progress. This drug may make you feel generally unwell. This is not uncommon, as chemotherapy can affect healthy cells as well as cancer cells. Report any side effects. Continue your course of treatment even though you feel ill unless your doctor tells you to stop. In some cases, you may be given additional medicines to help with side effects. Follow all directions for their use. Call your doctor or health care professional for advice if you get a fever, chills or sore throat, or other symptoms of a cold or flu. Do not treat yourself. This drug decreases your body's ability to fight  infections. Try to avoid being around people who are sick. This medicine may increase your risk to bruise or bleed. Call your doctor or health care professional if you notice any unusual bleeding. Be careful brushing and flossing your teeth or using a toothpick because you may get an infection or bleed more easily. If you have any dental work done, tell your dentist you are receiving this medicine. Avoid taking products that contain aspirin, acetaminophen, ibuprofen, naproxen, or ketoprofen unless instructed by your doctor. These medicines may hide a fever. Do not become pregnant while taking this medicine. Women should inform their doctor if they wish to become pregnant or think they might be pregnant. There is a potential for serious side effects to an unborn child. Talk to your health care professional or pharmacist for more information. Do not breast-feed an infant while taking this medicine. Men should inform their doctor if they wish to father a child. This medicine may lower sperm counts. Do not treat diarrhea with over the counter products. Contact your doctor if you have diarrhea that lasts more than 2 days or if it is severe and watery. This medicine can make you more sensitive to the sun. Keep out of the sun. If you cannot avoid being in the sun, wear protective clothing and  use sunscreen. Do not use sun lamps or tanning beds/booths. What side effects may I notice from receiving this medicine? Side effects that you should report to your doctor or health care professional as soon as possible: -allergic reactions like skin rash, itching or hives, swelling of the face, lips, or tongue -low blood counts - this medicine may decrease the number of white blood cells, red blood cells and platelets. You may be at increased risk for infections and bleeding. -signs of infection - fever or chills, cough, sore throat, pain or difficulty passing urine -signs of decreased platelets or bleeding - bruising,  pinpoint red spots on the skin, black, tarry stools, blood in the urine -signs of decreased red blood cells - unusually weak or tired, fainting spells, lightheadedness -breathing problems -changes in vision -chest pain -mouth sores -nausea and vomiting -pain, swelling, redness at site where injected -pain, tingling, numbness in the hands or feet -redness, swelling, or sores on hands or feet -stomach pain -unusual bleeding Side effects that usually do not require medical attention (report to your doctor or health care professional if they continue or are bothersome): -changes in finger or toe nails -diarrhea -dry or itchy skin -hair loss -headache -loss of appetite -sensitivity of eyes to the light -stomach upset -unusually teary eyes This list may not describe all possible side effects. Call your doctor for medical advice about side effects. You may report side effects to FDA at 1-800-FDA-1088. Where should I keep my medicine? This drug is given in a hospital or clinic and will not be stored at home. NOTE: This sheet is a summary. It may not cover all possible information. If you have questions about this medicine, talk to your doctor, pharmacist, or health care provider.  2015, [employer]vier/Gold Standard. (2007-05-04 13:53:16)

## 2014-05-17 NOTE — Progress Notes (Signed)
Ok to treat with decrease in hgb from 10.2 on 05/15/2014 to 8.9 and with Creatinine 2.0  today per Dr. Benay Spice.

## 2014-05-19 ENCOUNTER — Ambulatory Visit (HOSPITAL_BASED_OUTPATIENT_CLINIC_OR_DEPARTMENT_OTHER): Payer: Medicare Other

## 2014-05-19 VITALS — BP 112/53 | HR 95 | Temp 97.5°F | Resp 18

## 2014-05-19 DIAGNOSIS — Z452 Encounter for adjustment and management of vascular access device: Secondary | ICD-10-CM | POA: Diagnosis present

## 2014-05-19 DIAGNOSIS — C779 Secondary and unspecified malignant neoplasm of lymph node, unspecified: Secondary | ICD-10-CM

## 2014-05-19 DIAGNOSIS — C786 Secondary malignant neoplasm of retroperitoneum and peritoneum: Secondary | ICD-10-CM

## 2014-05-19 DIAGNOSIS — C189 Malignant neoplasm of colon, unspecified: Secondary | ICD-10-CM | POA: Diagnosis not present

## 2014-05-19 MED ORDER — HEPARIN SOD (PORK) LOCK FLUSH 100 UNIT/ML IV SOLN
500.0000 [IU] | Freq: Once | INTRAVENOUS | Status: AC | PRN
  Administered 2014-05-19: 500 [IU]
  Filled 2014-05-19: qty 5

## 2014-05-19 MED ORDER — SODIUM CHLORIDE 0.9 % IJ SOLN
10.0000 mL | INTRAMUSCULAR | Status: DC | PRN
  Administered 2014-05-19: 10 mL
  Filled 2014-05-19: qty 10

## 2014-05-19 NOTE — Patient Instructions (Signed)
[facility] Discharge Instructions for Patients Receiving Chemotherapy  Today you received the following chemotherapy agents 15fu,leucovorin and oxaliplatin. To help prevent nausea and vomiting after your treatment, we encourage you to take your nausea medication as prescribed.   If you develop nausea and vomiting that is not controlled by your nausea medication, call the clinic.   BELOW ARE SYMPTOMS THAT SHOULD BE REPORTED IMMEDIATELY:  *FEVER GREATER THAN 100.5 F  *CHILLS WITH OR WITHOUT FEVER  NAUSEA AND VOMITING THAT IS NOT CONTROLLED WITH YOUR NAUSEA MEDICATION  *UNUSUAL SHORTNESS OF BREATH  *UNUSUAL BRUISING OR BLEEDING  TENDERNESS IN MOUTH AND THROAT WITH OR WITHOUT PRESENCE OF ULCERS  *URINARY PROBLEMS  *BOWEL PROBLEMS  UNUSUAL RASH Items with * indicate a potential emergency and should be followed up as soon as possible.  Feel free to call the clinic you have any questions or concerns. The clinic phone number is [phone].  Please show the [place] [facility] at check-in to the Emergency Department and triage nurse.

## 2014-05-22 ENCOUNTER — Telehealth: Payer: Self-pay

## 2014-05-22 NOTE — Telephone Encounter (Signed)
-----   Message from Perlie Gold sent at 05/17/2014 12:50 PM EDT ----- Regarding: Chemo Follow up Call-Dr.Sherrill Contact: [phone] First time FOLFOX. Dr. Benay Spice patient. Please call.  Thanks,  Barnetta Chapel, RN

## 2014-05-22 NOTE — Telephone Encounter (Signed)
Chemo f/u call. "fantastic, no reactions what so ever". Clarified is eating and drinking, no nausea. Having BMs "as normal as I am".

## 2014-05-27 ENCOUNTER — Other Ambulatory Visit: Payer: Self-pay | Admitting: Oncology

## 2014-05-31 ENCOUNTER — Telehealth: Payer: Self-pay | Admitting: Oncology

## 2014-05-31 ENCOUNTER — Encounter: Payer: Self-pay | Admitting: *Deleted

## 2014-05-31 ENCOUNTER — Other Ambulatory Visit: Payer: Self-pay | Admitting: Oncology

## 2014-05-31 ENCOUNTER — Ambulatory Visit (HOSPITAL_BASED_OUTPATIENT_CLINIC_OR_DEPARTMENT_OTHER): Payer: Medicare Other | Admitting: Nurse Practitioner

## 2014-05-31 ENCOUNTER — Telehealth: Payer: Self-pay | Admitting: *Deleted

## 2014-05-31 ENCOUNTER — Ambulatory Visit (HOSPITAL_BASED_OUTPATIENT_CLINIC_OR_DEPARTMENT_OTHER): Payer: Medicare Other

## 2014-05-31 ENCOUNTER — Other Ambulatory Visit (HOSPITAL_BASED_OUTPATIENT_CLINIC_OR_DEPARTMENT_OTHER): Payer: Medicare Other

## 2014-05-31 VITALS — BP 120/66 | HR 88 | Temp 97.7°F | Resp 18 | Ht 60.0 in | Wt 233.5 lb

## 2014-05-31 DIAGNOSIS — C7989 Secondary malignant neoplasm of other specified sites: Secondary | ICD-10-CM

## 2014-05-31 DIAGNOSIS — C182 Malignant neoplasm of ascending colon: Secondary | ICD-10-CM | POA: Diagnosis present

## 2014-05-31 DIAGNOSIS — C189 Malignant neoplasm of colon, unspecified: Secondary | ICD-10-CM

## 2014-05-31 DIAGNOSIS — N289 Disorder of kidney and ureter, unspecified: Secondary | ICD-10-CM | POA: Diagnosis not present

## 2014-05-31 DIAGNOSIS — C787 Secondary malignant neoplasm of liver and intrahepatic bile duct: Secondary | ICD-10-CM

## 2014-05-31 DIAGNOSIS — R630 Anorexia: Secondary | ICD-10-CM

## 2014-05-31 DIAGNOSIS — C786 Secondary malignant neoplasm of retroperitoneum and peritoneum: Secondary | ICD-10-CM

## 2014-05-31 DIAGNOSIS — I4891 Unspecified atrial fibrillation: Secondary | ICD-10-CM

## 2014-05-31 DIAGNOSIS — N1330 Unspecified hydronephrosis: Secondary | ICD-10-CM | POA: Diagnosis not present

## 2014-05-31 DIAGNOSIS — D500 Iron deficiency anemia secondary to blood loss (chronic): Secondary | ICD-10-CM

## 2014-05-31 DIAGNOSIS — Z5111 Encounter for antineoplastic chemotherapy: Secondary | ICD-10-CM

## 2014-05-31 DIAGNOSIS — Z87891 Personal history of nicotine dependence: Secondary | ICD-10-CM

## 2014-05-31 LAB — COMPREHENSIVE METABOLIC PANEL (CC13)
ALK PHOS: 74 U/L (ref 40–150)
ALT: 7 U/L (ref 0–55)
ANION GAP: 13 meq/L — AB (ref 3–11)
AST: 17 U/L (ref 5–34)
Albumin: 3.7 g/dL (ref 3.5–5.0)
BILIRUBIN TOTAL: 0.6 mg/dL (ref 0.20–1.20)
BUN: 34.8 mg/dL — ABNORMAL HIGH (ref 7.0–26.0)
CHLORIDE: 106 meq/L (ref 98–109)
CO2: 20 meq/L — AB (ref 22–29)
Calcium: 8.9 mg/dL (ref 8.4–10.4)
Creatinine: 2.1 mg/dL — ABNORMAL HIGH (ref 0.6–1.1)
EGFR: 22 mL/min/{1.73_m2} — ABNORMAL LOW (ref >90)
Glucose: 106 mg/dl (ref 70–140)
Potassium: 4.6 mEq/L (ref 3.5–5.1)
SODIUM: 138 meq/L (ref 136–145)
Total Protein: 7.5 g/dL (ref 6.4–8.3)

## 2014-05-31 LAB — CBC WITH DIFFERENTIAL/PLATELET
BASO%: 0.7 % (ref 0.0–2.0)
BASOS ABS: 0 10*3/uL (ref 0.0–0.1)
EOS ABS: 0.1 10*3/uL (ref 0.0–0.5)
EOS%: 4.8 % (ref 0.0–7.0)
HEMATOCRIT: 25.5 % — AB (ref 34.8–46.6)
HEMOGLOBIN: 8.4 g/dL — AB (ref 11.6–15.9)
LYMPH#: 0.6 10*3/uL — AB (ref 0.9–3.3)
LYMPH%: 19.2 % (ref 14.0–49.7)
MCH: 30.6 pg (ref 25.1–34.0)
MCHC: 32.7 g/dL (ref 31.5–36.0)
MCV: 93.3 fL (ref 79.5–101.0)
MONO#: 0.3 10*3/uL (ref 0.1–0.9)
MONO%: 10.7 % (ref 0.0–14.0)
NEUT#: 1.9 10*3/uL (ref 1.5–6.5)
NEUT%: 64.6 % (ref 38.4–76.8)
Platelets: 207 10*3/uL (ref 145–400)
RBC: 2.74 10*6/uL — ABNORMAL LOW (ref 3.70–5.45)
RDW: 14.3 % (ref 11.2–14.5)
WBC: 2.9 10*3/uL — AB (ref 3.9–10.3)

## 2014-05-31 MED ORDER — SODIUM CHLORIDE 0.9 % IV SOLN
Freq: Once | INTRAVENOUS | Status: AC
  Administered 2014-05-31: 13:00:00 via INTRAVENOUS
  Filled 2014-05-31: qty 4

## 2014-05-31 MED ORDER — LEUCOVORIN CALCIUM INJECTION 350 MG
400.0000 mg/m2 | Freq: Once | INTRAMUSCULAR | Status: AC
  Administered 2014-05-31: 844 mg via INTRAVENOUS
  Filled 2014-05-31: qty 42.2

## 2014-05-31 MED ORDER — DEXTROSE 5 % IV SOLN
Freq: Once | INTRAVENOUS | Status: AC
  Administered 2014-05-31: 13:00:00 via INTRAVENOUS

## 2014-05-31 MED ORDER — SODIUM CHLORIDE 0.9 % IV SOLN
2380.0000 mg/m2 | INTRAVENOUS | Status: DC
  Administered 2014-05-31: 5000 mg via INTRAVENOUS
  Filled 2014-05-31: qty 100

## 2014-05-31 MED ORDER — OXALIPLATIN CHEMO INJECTION 100 MG/20ML
65.0000 mg/m2 | Freq: Once | INTRAVENOUS | Status: AC
  Administered 2014-05-31: 135 mg via INTRAVENOUS
  Filled 2014-05-31: qty 27

## 2014-05-31 MED ORDER — FLUOROURACIL CHEMO INJECTION 2.5 GM/50ML
400.0000 mg/m2 | Freq: Once | INTRAVENOUS | Status: AC
  Administered 2014-05-31: 850 mg via INTRAVENOUS
  Filled 2014-05-31: qty 17

## 2014-05-31 NOTE — Telephone Encounter (Signed)
Per Dr. Benay Spice: Will recheck CBC in 2 weeks with Type and Hold for possible transfusion.

## 2014-05-31 NOTE — Telephone Encounter (Signed)
Gave and printed appt sched and avs for pt for May and JUNE °

## 2014-05-31 NOTE — Progress Notes (Signed)
Oncology Nurse Navigator Documentation  Oncology Nurse Navigator Flowsheets 05/31/2014  Navigator Encounter Type Treatment-  Patient Visit Type Medonc  Treatment Phase Treatment Folfox #2  Barriers/Navigation Needs Family concerns-fatigue,feels cold all the time  Interventions Consult w/MD regarding Hgb 8.4  Time Spent with Patient 15  Hgb has been drifting down-now 8.4. She reports extreme fatigue and "I feel cold all the time". She is at home alone most of the time. ? CBC in a week.

## 2014-05-31 NOTE — [facility] Oncology Navigator Note (Signed)
Oncology Nurse Navigator Documentation  Oncology Nurse Navigator Flowsheets 05/31/2014  Navigator Encounter Type Treatment #2 Folfox  Patient Visit Type Medonc  Treatment Phase Treatment  Barriers/Navigation Needs Family concerns-fatigue,feels cold all the time  Interventions Consult w/MD about labs-hgb down 8.  Time Spent with Patient 15

## 2014-05-31 NOTE — Progress Notes (Signed)
  [place] OFFICE PROGRESS NOTE   Diagnosis:  Colon cancer  INTERVAL HISTORY:   Ms. Jimenez returns as scheduled. She completed cycle 1 FOLFOX 05/17/2014. She denies nausea/vomiting. No mouth sores. No diarrhea. She avoided cold following treatment. No numbness or tingling today. She continues to have intermittent pain at the right abdomen. She developed a "rash" on the buttocks recently. The rash has resolved.  Objective:  Vital signs in last 24 hours:  Blood pressure 120/66, pulse 88, temperature 97.7 F (36.5 C), temperature source Oral, resp. rate 18, height 5' (1.524 m), weight 233 lb 8 oz (105.915 kg).    HEENT: No thrush or ulcers. Resp: Lungs clear bilaterally. Cardio: Irregular. GI: Firm nodularity deep to the right lower quadrant surgical scar. Vascular: Bilateral lower leg edema with chronic stasis changes.  Port-A-Cath without erythema.    Lab Results:  Lab Results  Component Value Date   WBC 2.9* 05/31/2014   HGB 8.4* 05/31/2014   HCT 25.5* 05/31/2014   MCV 93.3 05/31/2014   PLT 207 05/31/2014   NEUTROABS 1.9 05/31/2014    Imaging:  No results found.  Medications: I have reviewed the patient's current medications.  Assessment/Plan: 1. 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 8 of 15 lymph nodes contained metastatic carcinoma, tumor deposits were present; microsatellite stable.  PET scan 08/24/2013 with mildly hypermetabolic small left axillary and right inguinal nodes--most likely unrelated to colon cancer, no clear evidence of metastatic disease.   Cycle 1 adjuvant Xeloda 09/05/2013  Cycle 2 adjuvant Xeloda 09/26/2013  Cycle 3 adjuvant Xeloda 10/17/2013  Cycle 4 adjuvant Xeloda 11/07/2013  Cycle 5 adjuvant Xeloda 11/28/2013  Cycle 6 adjuvant Xeloda 12/19/2013  Cycle 7 adjuvant Xeloda 01/09/2014  Cycle 8 adjuvant Xeloda  01/30/2014  Status post biopsy of nodularity at the right lower quadrant scar on 03/27/2014 confirming metastatic colon cancer  CT 04/05/2014 with new right hydronephrosis, soft tissue thickening adjacent to the ileocolonic anastomosis and nodularity in the overlying subcutaneous tissue  PET scan 05/02/2014 with hypermetabolic soft tissue adjacent to the ileocolic anastomosis beneath the right abdominal wall. Suspected soft tissue metastasis in the right abdominal wall, peritoneal disease along the right liver and pelvic nodal metastases. Suspected right pelvic implant. Associated mild right hydronephrosis with indwelling ureteral stent.  Cycle 1 FOLFOX 05/17/2014  Cycle 2 FOLFOX 05/31/2014 2. Atrial fibrillation 3. Anemia secondary to bleeding from the colon cancer versus renal insufficiency, and Xeloda. Improved. 4. History of tobacco use with COPD changes on the CT 04/07/2013  5. Morbid obesity  6. Status post gastric banding surgery  7. Renal insufficiency  8. Anorexia-etiology unclear. Improved 9. Right hydronephrosis on the CT 04/05/2014. She was referred to urology and underwent placement of a ureter stent on 04/27/2014. Renal function has improved. 10. Port-A-Cath placement 05/15/2014   Disposition: Ms. Forge appears stable. She has completed 1 cycle of FOLFOX. Plan to proceed with cycle 2 today as scheduled. She will return for a follow-up visit and cycle 3 in 2 weeks. She will contact the office in the interim with any problems.   Plan reviewed with Dr. Benay Spice.   Ned Card ANP/GNP-BC   05/31/2014  12:30 PM

## 2014-05-31 NOTE — Patient Instructions (Signed)
[facility] Discharge Instructions for Patients Receiving Chemotherapy  Today you received the following chemotherapy agents FOLFOX.   To help prevent nausea and vomiting after your treatment, we encourage you to take your nausea medication as directed.    If you develop nausea and vomiting that is not controlled by your nausea medication, call the clinic.   BELOW ARE SYMPTOMS THAT SHOULD BE REPORTED IMMEDIATELY:  *FEVER GREATER THAN 100.5 F  *CHILLS WITH OR WITHOUT FEVER  NAUSEA AND VOMITING THAT IS NOT CONTROLLED WITH YOUR NAUSEA MEDICATION  *UNUSUAL SHORTNESS OF BREATH  *UNUSUAL BRUISING OR BLEEDING  TENDERNESS IN MOUTH AND THROAT WITH OR WITHOUT PRESENCE OF ULCERS  *URINARY PROBLEMS  *BOWEL PROBLEMS  UNUSUAL RASH Items with * indicate a potential emergency and should be followed up as soon as possible.  Feel free to call the clinic you have any questions or concerns. The clinic phone number is [phone].  Please show the CHEMO ALERT CARD at check-in to the Emergency Department and triage nurse.   

## 2014-06-01 NOTE — Telephone Encounter (Signed)
Spoke with pt, she reports she feels OK- just cold all the time. Informed her Dr. Benay Spice recommends re-checking CBC in 2 weeks to see if she needs transfusion. Instructed her to call office if she has any bleeding or feels like she needs a transfusion sooner. Pt voiced understanding.

## 2014-06-02 ENCOUNTER — Ambulatory Visit (HOSPITAL_BASED_OUTPATIENT_CLINIC_OR_DEPARTMENT_OTHER): Payer: Medicare Other

## 2014-06-02 VITALS — BP 132/61 | HR 80 | Temp 97.4°F

## 2014-06-02 DIAGNOSIS — Z452 Encounter for adjustment and management of vascular access device: Secondary | ICD-10-CM | POA: Diagnosis present

## 2014-06-02 DIAGNOSIS — C182 Malignant neoplasm of ascending colon: Secondary | ICD-10-CM | POA: Diagnosis not present

## 2014-06-02 DIAGNOSIS — C189 Malignant neoplasm of colon, unspecified: Secondary | ICD-10-CM

## 2014-06-02 MED ORDER — HEPARIN SOD (PORK) LOCK FLUSH 100 UNIT/ML IV SOLN
500.0000 [IU] | Freq: Once | INTRAVENOUS | Status: AC | PRN
  Administered 2014-06-02: 500 [IU]
  Filled 2014-06-02: qty 5

## 2014-06-02 MED ORDER — SODIUM CHLORIDE 0.9 % IJ SOLN
10.0000 mL | INTRAMUSCULAR | Status: DC | PRN
Start: 2014-06-02 — End: 2014-06-02
  Administered 2014-06-02: 10 mL
  Filled 2014-06-02: qty 10

## 2014-06-11 ENCOUNTER — Other Ambulatory Visit: Payer: Self-pay | Admitting: Oncology

## 2014-06-14 ENCOUNTER — Ambulatory Visit (HOSPITAL_BASED_OUTPATIENT_CLINIC_OR_DEPARTMENT_OTHER): Payer: Medicare Other

## 2014-06-14 ENCOUNTER — Telehealth: Payer: Self-pay | Admitting: Oncology

## 2014-06-14 ENCOUNTER — Other Ambulatory Visit (HOSPITAL_BASED_OUTPATIENT_CLINIC_OR_DEPARTMENT_OTHER): Payer: Medicare Other

## 2014-06-14 ENCOUNTER — Ambulatory Visit (HOSPITAL_BASED_OUTPATIENT_CLINIC_OR_DEPARTMENT_OTHER): Payer: Medicare Other | Admitting: Oncology

## 2014-06-14 VITALS — BP 138/60 | HR 98 | Temp 97.5°F | Resp 22 | Ht 60.0 in | Wt 237.5 lb

## 2014-06-14 DIAGNOSIS — I4891 Unspecified atrial fibrillation: Secondary | ICD-10-CM

## 2014-06-14 DIAGNOSIS — C7989 Secondary malignant neoplasm of other specified sites: Secondary | ICD-10-CM

## 2014-06-14 DIAGNOSIS — N289 Disorder of kidney and ureter, unspecified: Secondary | ICD-10-CM

## 2014-06-14 DIAGNOSIS — C182 Malignant neoplasm of ascending colon: Secondary | ICD-10-CM

## 2014-06-14 DIAGNOSIS — C189 Malignant neoplasm of colon, unspecified: Secondary | ICD-10-CM

## 2014-06-14 DIAGNOSIS — C787 Secondary malignant neoplasm of liver and intrahepatic bile duct: Secondary | ICD-10-CM | POA: Diagnosis not present

## 2014-06-14 DIAGNOSIS — C786 Secondary malignant neoplasm of retroperitoneum and peritoneum: Secondary | ICD-10-CM

## 2014-06-14 DIAGNOSIS — J449 Chronic obstructive pulmonary disease, unspecified: Secondary | ICD-10-CM

## 2014-06-14 DIAGNOSIS — Z5111 Encounter for antineoplastic chemotherapy: Secondary | ICD-10-CM | POA: Diagnosis present

## 2014-06-14 DIAGNOSIS — N1330 Unspecified hydronephrosis: Secondary | ICD-10-CM | POA: Diagnosis not present

## 2014-06-14 DIAGNOSIS — Z87891 Personal history of nicotine dependence: Secondary | ICD-10-CM

## 2014-06-14 DIAGNOSIS — D6481 Anemia due to antineoplastic chemotherapy: Secondary | ICD-10-CM

## 2014-06-14 LAB — COMPREHENSIVE METABOLIC PANEL (CC13)
ALT: 6 U/L (ref 0–55)
ANION GAP: 9 meq/L (ref 3–11)
AST: 18 U/L (ref 5–34)
Albumin: 3.5 g/dL (ref 3.5–5.0)
Alkaline Phosphatase: 73 U/L (ref 40–150)
BUN: 33.9 mg/dL — AB (ref 7.0–26.0)
CO2: 23 meq/L (ref 22–29)
CREATININE: 2.2 mg/dL — AB (ref 0.6–1.1)
Calcium: 9.1 mg/dL (ref 8.4–10.4)
Chloride: 107 mEq/L (ref 98–109)
EGFR: 21 mL/min/{1.73_m2} — AB (ref >90)
Glucose: 98 mg/dl (ref 70–140)
Potassium: 4.9 mEq/L (ref 3.5–5.1)
Sodium: 139 mEq/L (ref 136–145)
Total Bilirubin: 0.59 mg/dL (ref 0.20–1.20)
Total Protein: 7.3 g/dL (ref 6.4–8.3)

## 2014-06-14 LAB — CBC WITH DIFFERENTIAL/PLATELET
BASO%: 0.6 % (ref 0.0–2.0)
Basophils Absolute: 0 10*3/uL (ref 0.0–0.1)
EOS%: 3.2 % (ref 0.0–7.0)
Eosinophils Absolute: 0.1 10*3/uL (ref 0.0–0.5)
HEMATOCRIT: 24.3 % — AB (ref 34.8–46.6)
HGB: 8 g/dL — ABNORMAL LOW (ref 11.6–15.9)
LYMPH%: 19.9 % (ref 14.0–49.7)
MCH: 31.1 pg (ref 25.1–34.0)
MCHC: 33 g/dL (ref 31.5–36.0)
MCV: 94.1 fL (ref 79.5–101.0)
MONO#: 0.3 10*3/uL (ref 0.1–0.9)
MONO%: 12.3 % (ref 0.0–14.0)
NEUT%: 64 % (ref 38.4–76.8)
NEUTROS ABS: 1.8 10*3/uL (ref 1.5–6.5)
Platelets: 164 10*3/uL (ref 145–400)
RBC: 2.58 10*6/uL — AB (ref 3.70–5.45)
RDW: 15.3 % — ABNORMAL HIGH (ref 11.2–14.5)
WBC: 2.8 10*3/uL — AB (ref 3.9–10.3)
lymph#: 0.5 10*3/uL — ABNORMAL LOW (ref 0.9–3.3)

## 2014-06-14 LAB — HOLD TUBE, BLOOD BANK

## 2014-06-14 MED ORDER — OXALIPLATIN CHEMO INJECTION 100 MG/20ML
65.0000 mg/m2 | Freq: Once | INTRAVENOUS | Status: AC
  Administered 2014-06-14: 135 mg via INTRAVENOUS
  Filled 2014-06-14: qty 27

## 2014-06-14 MED ORDER — HEPARIN SOD (PORK) LOCK FLUSH 100 UNIT/ML IV SOLN
500.0000 [IU] | Freq: Once | INTRAVENOUS | Status: DC | PRN
  Filled 2014-06-14: qty 5

## 2014-06-14 MED ORDER — SODIUM CHLORIDE 0.9 % IV SOLN
Freq: Once | INTRAVENOUS | Status: AC
  Administered 2014-06-14: 11:00:00 via INTRAVENOUS
  Filled 2014-06-14: qty 4

## 2014-06-14 MED ORDER — SODIUM CHLORIDE 0.9 % IJ SOLN
10.0000 mL | INTRAMUSCULAR | Status: DC | PRN
  Filled 2014-06-14: qty 10

## 2014-06-14 MED ORDER — FLUOROURACIL CHEMO INJECTION 5 GM/100ML
2380.0000 mg/m2 | INTRAVENOUS | Status: DC
  Administered 2014-06-14: 5000 mg via INTRAVENOUS
  Filled 2014-06-14: qty 100

## 2014-06-14 MED ORDER — DEXTROSE 5 % IV SOLN
Freq: Once | INTRAVENOUS | Status: AC
  Administered 2014-06-14: 11:00:00 via INTRAVENOUS

## 2014-06-14 MED ORDER — FLUOROURACIL CHEMO INJECTION 2.5 GM/50ML
400.0000 mg/m2 | Freq: Once | INTRAVENOUS | Status: AC
  Administered 2014-06-14: 850 mg via INTRAVENOUS
  Filled 2014-06-14: qty 17

## 2014-06-14 MED ORDER — LEUCOVORIN CALCIUM INJECTION 350 MG
400.0000 mg/m2 | Freq: Once | INTRAVENOUS | Status: AC
  Administered 2014-06-14: 844 mg via INTRAVENOUS
  Filled 2014-06-14: qty 42.2

## 2014-06-14 NOTE — Progress Notes (Signed)
  [place] OFFICE PROGRESS NOTE   Diagnosis: Colon cancer  INTERVAL HISTORY:   Natalie Chapman returns as scheduled. She completed cycle 2 of FOLFOX 05/31/2014. She tolerated chemotherapy well. No nausea/vomiting, mouth sores, or diarrhea. No neuropathy symptoms. No abdominal pain. She has noted increased leg edema since discontinuing Lasix.  Objective:  Vital signs in last 24 hours:  Blood pressure 138/60, pulse 98, temperature 97.5 F (36.4 C), temperature source Oral, resp. rate 22, height 5' (1.524 m), weight 237 lb 8 oz (107.729 kg), SpO2 100 %.    HEENT: No thrush or ulcers Resp: Lungs clear bilaterally Cardio: Irregular GI: No hepatomegaly, nontender, mass like firmness at the right lower quadrant scar and superior to the scar Vascular: Chronic stasis change with pitting edema at the lower leg bilaterally  Portacath/PICC-without erythema  Lab Results:  Lab Results  Component Value Date   WBC 2.8* 06/14/2014   HGB 8.0* 06/14/2014   HCT 24.3* 06/14/2014   MCV 94.1 06/14/2014   PLT 164 06/14/2014   NEUTROABS 1.8 06/14/2014    Medications: I have reviewed the patient's current medications.  Assessment/Plan: 1. 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 8 of 15 lymph nodes contained metastatic carcinoma, tumor deposits were present; microsatellite stable.  PET scan 08/24/2013 with mildly hypermetabolic small left axillary and right inguinal nodes--most likely unrelated to colon cancer, no clear evidence of metastatic disease.   Cycle 1 adjuvant Xeloda 09/05/2013  Cycle 2 adjuvant Xeloda 09/26/2013  Cycle 3 adjuvant Xeloda 10/17/2013  Cycle 4 adjuvant Xeloda 11/07/2013  Cycle 5 adjuvant Xeloda 11/28/2013  Cycle 6 adjuvant Xeloda 12/19/2013  Cycle 7 adjuvant Xeloda 01/09/2014  Cycle 8 adjuvant Xeloda 01/30/2014  Status post biopsy of nodularity  at the right lower quadrant scar on 03/27/2014 confirming metastatic colon cancer  CT 04/05/2014 with new right hydronephrosis, soft tissue thickening adjacent to the ileocolonic anastomosis and nodularity in the overlying subcutaneous tissue  PET scan 05/02/2014 with hypermetabolic soft tissue adjacent to the ileocolic anastomosis beneath the right abdominal wall. Suspected soft tissue metastasis in the right abdominal wall, peritoneal disease along the right liver and pelvic nodal metastases. Suspected right pelvic implant. Associated mild right hydronephrosis with indwelling ureteral stent.  Cycle 1 FOLFOX 05/17/2014  Cycle 2 FOLFOX 05/31/2014  Cycle 3 FOLFOX 06/14/2014 2. Atrial fibrillation 3. Anemia secondary to bleeding from the colon cancer versus renal insufficiency, and chemotherapy 4. History of tobacco use with COPD changes on the CT 04/07/2013  5. Morbid obesity  6. Status post gastric banding surgery  7. Renal insufficiency  8. Anorexia-etiology unclear. Improved 9. Right hydronephrosis on the CT 04/05/2014. She was referred to urology and underwent placement of a ureter stent on 04/27/2014. Renal function has improved. 10. Port-A-Cath placement 05/15/2014    Disposition:  Natalie Chapman appears stable. She is tolerating the chemotherapy well. The plan is to proceed with cycle 3 FOLFOX today. We will check a CEA when she returns in 2 weeks. The plan is to obtain a restaging CT abdomen/pelvis after 5-6 cycles of FOLFOX. She will resume Lasix. She has stable anemia. She does not appear symptomatic from the anemia. We will arrange for a red cell transfusion if the hemoglobin is lower when she returns in 2 weeks.  Betsy Coder, MD  06/14/2014  9:27 AM

## 2014-06-14 NOTE — Telephone Encounter (Signed)
Gave and printed appt sched and avs for pt for JUNE °

## 2014-06-14 NOTE — Patient Instructions (Signed)
Leucovorin injection What is this medicine? LEUCOVORIN (loo koe VOR in) is used to prevent or treat the harmful effects of some medicines. This medicine is used to treat anemia caused by a low amount of folic acid in the body. It is also used with 5-fluorouracil (5-FU) to treat colon cancer. This medicine may be used for other purposes; ask your health care provider or pharmacist if you have questions. What should I tell my health care provider before I take this medicine? They need to know if you have any of these conditions: -anemia from low levels of vitamin B-12 in the blood -an unusual or allergic reaction to leucovorin, folic acid, other medicines, foods, dyes, or preservatives -pregnant or trying to get pregnant -breast-feeding How should I use this medicine? This medicine is for injection into a muscle or into a vein. It is given by a health care professional in a hospital or clinic setting. Talk to your pediatrician regarding the use of this medicine in children. Special care may be needed. Overdosage: If you think you have taken too much of this medicine contact a poison control center or emergency room at once. NOTE: This medicine is only for you. Do not share this medicine with others. What if I miss a dose? This does not apply. What may interact with this medicine? -capecitabine -fluorouracil -phenobarbital -phenytoin -primidone -trimethoprim-sulfamethoxazole This list may not describe all possible interactions. Give your health care provider a list of all the medicines, herbs, non-prescription drugs, or dietary supplements you use. Also tell them if you smoke, drink alcohol, or use illegal drugs. Some items may interact with your medicine. What should I watch for while using this medicine? Your condition will be monitored carefully while you are receiving this medicine. This medicine may increase the side effects of 5-fluorouracil, 5-FU. Tell your doctor or health care  professional if you have diarrhea or mouth sores that do not get better or that get worse. What side effects may I notice from receiving this medicine? Side effects that you should report to your doctor or health care professional as soon as possible: -allergic reactions like skin rash, itching or hives, swelling of the face, lips, or tongue -breathing problems -fever, infection -mouth sores -unusual bleeding or bruising -unusually weak or tired Side effects that usually do not require medical attention (report to your doctor or health care professional if they continue or are bothersome): -constipation or diarrhea -loss of appetite -nausea, vomiting This list may not describe all possible side effects. Call your doctor for medical advice about side effects. You may report side effects to FDA at 1-800-FDA-1088. Where should I keep my medicine? This drug is given in a hospital or clinic and will not be stored at home. NOTE: This sheet is a summary. It may not cover all possible information. If you have questions about this medicine, talk to your doctor, pharmacist, or health care provider.  2015, [employer]vier/[employer] Standard. (2007-07-05 16:50:29) Fluorouracil, 5-FU injection What is this medicine? FLUOROURACIL, 5-FU (flure oh YOOR a sil) is a chemotherapy drug. It slows the growth of cancer cells. This medicine is used to treat many types of cancer like breast cancer, colon or rectal cancer, pancreatic cancer, and stomach cancer. This medicine may be used for other purposes; ask your health care provider or pharmacist if you have questions. COMMON BRAND NAME(S): Adrucil What should I tell my health care provider before I take this medicine? They need to know if you have any of these conditions: -  blood disorders -dihydropyrimidine dehydrogenase (DPD) deficiency -infection (especially a virus infection such as chickenpox, cold sores, or herpes) -kidney disease -liver disease -malnourished,  poor nutrition -recent or ongoing radiation therapy -an unusual or allergic reaction to fluorouracil, other chemotherapy, other medicines, foods, dyes, or preservatives -pregnant or trying to get pregnant -breast-feeding How should I use this medicine? This drug is given as an infusion or injection into a vein. It is administered in a hospital or clinic by a specially trained health care professional. Talk to your pediatrician regarding the use of this medicine in children. Special care may be needed. Overdosage: If you think you have taken too much of this medicine contact a poison control center or emergency room at once. NOTE: This medicine is only for you. Do not share this medicine with others. What if I miss a dose? It is important not to miss your dose. Call your doctor or health care professional if you are unable to keep an appointment. What may interact with this medicine? -allopurinol -cimetidine -dapsone -digoxin -hydroxyurea -leucovorin -levamisole -medicines for seizures like ethotoin, fosphenytoin, phenytoin -medicines to increase blood counts like filgrastim, pegfilgrastim, sargramostim -medicines that treat or prevent blood clots like warfarin, enoxaparin, and dalteparin -methotrexate -metronidazole -pyrimethamine -some other chemotherapy drugs like busulfan, cisplatin, estramustine, vinblastine -trimethoprim -trimetrexate -vaccines Talk to your doctor or health care professional before taking any of these medicines: -acetaminophen -aspirin -ibuprofen -ketoprofen -naproxen This list may not describe all possible interactions. Give your health care provider a list of all the medicines, herbs, non-prescription drugs, or dietary supplements you use. Also tell them if you smoke, drink alcohol, or use illegal drugs. Some items may interact with your medicine. What should I watch for while using this medicine? Visit your doctor for checks on your progress. This drug  may make you feel generally unwell. This is not uncommon, as chemotherapy can affect healthy cells as well as cancer cells. Report any side effects. Continue your course of treatment even though you feel ill unless your doctor tells you to stop. In some cases, you may be given additional medicines to help with side effects. Follow all directions for their use. Call your doctor or health care professional for advice if you get a fever, chills or sore throat, or other symptoms of a cold or flu. Do not treat yourself. This drug decreases your body's ability to fight infections. Try to avoid being around people who are sick. This medicine may increase your risk to bruise or bleed. Call your doctor or health care professional if you notice any unusual bleeding. Be careful brushing and flossing your teeth or using a toothpick because you may get an infection or bleed more easily. If you have any dental work done, tell your dentist you are receiving this medicine. Avoid taking products that contain aspirin, acetaminophen, ibuprofen, naproxen, or ketoprofen unless instructed by your doctor. These medicines may hide a fever. Do not become pregnant while taking this medicine. Women should inform their doctor if they wish to become pregnant or think they might be pregnant. There is a potential for serious side effects to an unborn child. Talk to your health care professional or pharmacist for more information. Do not breast-feed an infant while taking this medicine. Men should inform their doctor if they wish to father a child. This medicine may lower sperm counts. Do not treat diarrhea with over the counter products. Contact your doctor if you have diarrhea that lasts more than 2 days or if it  is severe and watery. This medicine can make you more sensitive to the sun. Keep out of the sun. If you cannot avoid being in the sun, wear protective clothing and use sunscreen. Do not use sun lamps or tanning  beds/booths. What side effects may I notice from receiving this medicine? Side effects that you should report to your doctor or health care professional as soon as possible: -allergic reactions like skin rash, itching or hives, swelling of the face, lips, or tongue -low blood counts - this medicine may decrease the number of white blood cells, red blood cells and platelets. You may be at increased risk for infections and bleeding. -signs of infection - fever or chills, cough, sore throat, pain or difficulty passing urine -signs of decreased platelets or bleeding - bruising, pinpoint red spots on the skin, black, tarry stools, blood in the urine -signs of decreased red blood cells - unusually weak or tired, fainting spells, lightheadedness -breathing problems -changes in vision -chest pain -mouth sores -nausea and vomiting -pain, swelling, redness at site where injected -pain, tingling, numbness in the hands or feet -redness, swelling, or sores on hands or feet -stomach pain -unusual bleeding Side effects that usually do not require medical attention (report to your doctor or health care professional if they continue or are bothersome): -changes in finger or toe nails -diarrhea -dry or itchy skin -hair loss -headache -loss of appetite -sensitivity of eyes to the light -stomach upset -unusually teary eyes This list may not describe all possible side effects. Call your doctor for medical advice about side effects. You may report side effects to FDA at 1-800-FDA-1088. Where should I keep my medicine? This drug is given in a hospital or clinic and will not be stored at home. NOTE: This sheet is a summary. It may not cover all possible information. If you have questions about this medicine, talk to your doctor, pharmacist, or health care provider.  2015, [employer]vier/Gold Standard. (2007-05-04 13:53:16) Oxaliplatin Injection What is this medicine? OXALIPLATIN (ox AL i PLA tin) is a  chemotherapy drug. It targets fast dividing cells, like cancer cells, and causes these cells to die. This medicine is used to treat cancers of the colon and rectum, and many other cancers. This medicine may be used for other purposes; ask your health care provider or pharmacist if you have questions. COMMON BRAND NAME(S): Eloxatin What should I tell my health care provider before I take this medicine? They need to know if you have any of these conditions: -kidney disease -an unusual or allergic reaction to oxaliplatin, other chemotherapy, other medicines, foods, dyes, or preservatives -pregnant or trying to get pregnant -breast-feeding How should I use this medicine? This drug is given as an infusion into a vein. It is administered in a hospital or clinic by a specially trained health care professional. Talk to your pediatrician regarding the use of this medicine in children. Special care may be needed. Overdosage: If you think you have taken too much of this medicine contact a poison control center or emergency room at once. NOTE: This medicine is only for you. Do not share this medicine with others. What if I miss a dose? It is important not to miss a dose. Call your doctor or health care professional if you are unable to keep an appointment. What may interact with this medicine? -medicines to increase blood counts like filgrastim, pegfilgrastim, sargramostim -probenecid -some antibiotics like amikacin, gentamicin, neomycin, polymyxin B, streptomycin, tobramycin -zalcitabine Talk to your doctor or  health care professional before taking any of these medicines: -acetaminophen -aspirin -ibuprofen -ketoprofen -naproxen This list may not describe all possible interactions. Give your health care provider a list of all the medicines, herbs, non-prescription drugs, or dietary supplements you use. Also tell them if you smoke, drink alcohol, or use illegal drugs. Some items may interact with your  medicine. What should I watch for while using this medicine? Your condition will be monitored carefully while you are receiving this medicine. You will need important blood work done while you are taking this medicine. This medicine can make you more sensitive to cold. Do not drink cold drinks or use ice. Cover exposed skin before coming in contact with cold temperatures or cold objects. When out in cold weather wear warm clothing and cover your mouth and nose to warm the air that goes into your lungs. Tell your doctor if you get sensitive to the cold. This drug may make you feel generally unwell. This is not uncommon, as chemotherapy can affect healthy cells as well as cancer cells. Report any side effects. Continue your course of treatment even though you feel ill unless your doctor tells you to stop. In some cases, you may be given additional medicines to help with side effects. Follow all directions for their use. Call your doctor or health care professional for advice if you get a fever, chills or sore throat, or other symptoms of a cold or flu. Do not treat yourself. This drug decreases your body's ability to fight infections. Try to avoid being around people who are sick. This medicine may increase your risk to bruise or bleed. Call your doctor or health care professional if you notice any unusual bleeding. Be careful brushing and flossing your teeth or using a toothpick because you may get an infection or bleed more easily. If you have any dental work done, tell your dentist you are receiving this medicine. Avoid taking products that contain aspirin, acetaminophen, ibuprofen, naproxen, or ketoprofen unless instructed by your doctor. These medicines may hide a fever. Do not become pregnant while taking this medicine. Women should inform their doctor if they wish to become pregnant or think they might be pregnant. There is a potential for serious side effects to an unborn child. Talk to your health  care professional or pharmacist for more information. Do not breast-feed an infant while taking this medicine. Call your doctor or health care professional if you get diarrhea. Do not treat yourself. What side effects may I notice from receiving this medicine? Side effects that you should report to your doctor or health care professional as soon as possible: -allergic reactions like skin rash, itching or hives, swelling of the face, lips, or tongue -low blood counts - This drug may decrease the number of white blood cells, red blood cells and platelets. You may be at increased risk for infections and bleeding. -signs of infection - fever or chills, cough, sore throat, pain or difficulty passing urine -signs of decreased platelets or bleeding - bruising, pinpoint red spots on the skin, black, tarry stools, nosebleeds -signs of decreased red blood cells - unusually weak or tired, fainting spells, lightheadedness -breathing problems -chest pain, pressure -cough -diarrhea -jaw tightness -mouth sores -nausea and vomiting -pain, swelling, redness or irritation at the injection site -pain, tingling, numbness in the hands or feet -problems with balance, talking, walking -redness, blistering, peeling or loosening of the skin, including inside the mouth -trouble passing urine or change in the amount of urine  Side effects that usually do not require medical attention (report to your doctor or health care professional if they continue or are bothersome): -changes in vision -constipation -hair loss -loss of appetite -metallic taste in the mouth or changes in taste -stomach pain This list may not describe all possible side effects. Call your doctor for medical advice about side effects. You may report side effects to FDA at 1-800-FDA-1088. Where should I keep my medicine? This drug is given in a hospital or clinic and will not be stored at home. NOTE: This sheet is a summary. It may not cover all  possible information. If you have questions about this medicine, talk to your doctor, pharmacist, or health care provider.  2015, Elsevier/Gold Standard. (2007-07-26 17:22:47)

## 2014-06-15 ENCOUNTER — Telehealth: Payer: Self-pay | Admitting: *Deleted

## 2014-06-15 ENCOUNTER — Other Ambulatory Visit (HOSPITAL_COMMUNITY)
Admission: RE | Admit: 2014-06-15 | Discharge: 2014-06-15 | Disposition: A | Discharge status: Home / Self Care | Payer: Medicare Other | Source: Ambulatory Visit | Attending: Family Medicine | Admitting: Family Medicine

## 2014-06-15 DIAGNOSIS — C189 Malignant neoplasm of colon, unspecified: Secondary | ICD-10-CM | POA: Diagnosis present

## 2014-06-15 NOTE — Telephone Encounter (Signed)
Spoke with Maudie Mercury in pathology, requested foundation one testing on pt's colon resection. She will forward request to [facility] pathology. Per Dr. Benay Spice: Can be done on original path or abdominal wall biopsy.

## 2014-06-16 ENCOUNTER — Ambulatory Visit (HOSPITAL_BASED_OUTPATIENT_CLINIC_OR_DEPARTMENT_OTHER): Payer: Medicare Other

## 2014-06-16 VITALS — BP 109/56 | HR 80 | Temp 97.6°F | Resp 18

## 2014-06-16 DIAGNOSIS — C182 Malignant neoplasm of ascending colon: Secondary | ICD-10-CM | POA: Diagnosis present

## 2014-06-16 DIAGNOSIS — C189 Malignant neoplasm of colon, unspecified: Secondary | ICD-10-CM

## 2014-06-16 MED ORDER — SODIUM CHLORIDE 0.9 % IJ SOLN
10.0000 mL | INTRAMUSCULAR | Status: DC | PRN
  Administered 2014-06-16: 10 mL
  Filled 2014-06-16: qty 10

## 2014-06-16 MED ORDER — HEPARIN SOD (PORK) LOCK FLUSH 100 UNIT/ML IV SOLN
500.0000 [IU] | Freq: Once | INTRAVENOUS | Status: AC | PRN
  Administered 2014-06-16: 500 [IU]
  Filled 2014-06-16: qty 5

## 2014-06-24 ENCOUNTER — Other Ambulatory Visit: Payer: Self-pay | Admitting: Oncology

## 2014-06-26 ENCOUNTER — Encounter (HOSPITAL_COMMUNITY): Payer: Self-pay

## 2014-06-28 ENCOUNTER — Ambulatory Visit (HOSPITAL_COMMUNITY)
Admission: RE | Admit: 2014-06-28 | Discharge: 2014-06-28 | Disposition: A | Discharge status: Home / Self Care | Payer: Medicare Other | Source: Ambulatory Visit | Attending: Oncology | Admitting: Oncology

## 2014-06-28 ENCOUNTER — Ambulatory Visit (HOSPITAL_BASED_OUTPATIENT_CLINIC_OR_DEPARTMENT_OTHER): Payer: Medicare Other

## 2014-06-28 ENCOUNTER — Other Ambulatory Visit (HOSPITAL_BASED_OUTPATIENT_CLINIC_OR_DEPARTMENT_OTHER): Payer: Medicare Other

## 2014-06-28 ENCOUNTER — Telehealth: Payer: Self-pay | Admitting: Oncology

## 2014-06-28 ENCOUNTER — Ambulatory Visit (HOSPITAL_BASED_OUTPATIENT_CLINIC_OR_DEPARTMENT_OTHER): Payer: Medicare Other | Admitting: Oncology

## 2014-06-28 VITALS — BP 139/81 | HR 91 | Temp 97.6°F | Resp 18

## 2014-06-28 VITALS — BP 111/63 | HR 96 | Temp 97.5°F | Resp 18 | Wt 235.7 lb

## 2014-06-28 DIAGNOSIS — C182 Malignant neoplasm of ascending colon: Secondary | ICD-10-CM | POA: Diagnosis present

## 2014-06-28 DIAGNOSIS — Z5111 Encounter for antineoplastic chemotherapy: Secondary | ICD-10-CM | POA: Diagnosis present

## 2014-06-28 DIAGNOSIS — C7989 Secondary malignant neoplasm of other specified sites: Secondary | ICD-10-CM

## 2014-06-28 DIAGNOSIS — N289 Disorder of kidney and ureter, unspecified: Secondary | ICD-10-CM | POA: Diagnosis not present

## 2014-06-28 DIAGNOSIS — Z87891 Personal history of nicotine dependence: Secondary | ICD-10-CM | POA: Diagnosis not present

## 2014-06-28 DIAGNOSIS — C787 Secondary malignant neoplasm of liver and intrahepatic bile duct: Secondary | ICD-10-CM

## 2014-06-28 DIAGNOSIS — C189 Malignant neoplasm of colon, unspecified: Secondary | ICD-10-CM | POA: Diagnosis present

## 2014-06-28 DIAGNOSIS — C786 Secondary malignant neoplasm of retroperitoneum and peritoneum: Secondary | ICD-10-CM

## 2014-06-28 DIAGNOSIS — R630 Anorexia: Secondary | ICD-10-CM | POA: Diagnosis not present

## 2014-06-28 DIAGNOSIS — I4891 Unspecified atrial fibrillation: Secondary | ICD-10-CM | POA: Diagnosis not present

## 2014-06-28 DIAGNOSIS — N1330 Unspecified hydronephrosis: Secondary | ICD-10-CM

## 2014-06-28 DIAGNOSIS — J449 Chronic obstructive pulmonary disease, unspecified: Secondary | ICD-10-CM

## 2014-06-28 DIAGNOSIS — D649 Anemia, unspecified: Secondary | ICD-10-CM | POA: Diagnosis not present

## 2014-06-28 LAB — CBC WITH DIFFERENTIAL/PLATELET
BASO%: 0.7 % (ref 0.0–2.0)
BASOS ABS: 0 10*3/uL (ref 0.0–0.1)
EOS%: 3 % (ref 0.0–7.0)
Eosinophils Absolute: 0.1 10*3/uL (ref 0.0–0.5)
HEMATOCRIT: 22.1 % — AB (ref 34.8–46.6)
HEMOGLOBIN: 7.3 g/dL — AB (ref 11.6–15.9)
LYMPH#: 0.4 10*3/uL — AB (ref 0.9–3.3)
LYMPH%: 12.9 % — ABNORMAL LOW (ref 14.0–49.7)
MCH: 32.1 pg (ref 25.1–34.0)
MCHC: 32.9 g/dL (ref 31.5–36.0)
MCV: 97.5 fL (ref 79.5–101.0)
MONO#: 0.3 10*3/uL (ref 0.1–0.9)
MONO%: 10.9 % (ref 0.0–14.0)
NEUT#: 2.1 10*3/uL (ref 1.5–6.5)
NEUT%: 72.5 % (ref 38.4–76.8)
Platelets: 166 10*3/uL (ref 145–400)
RBC: 2.27 10*6/uL — ABNORMAL LOW (ref 3.70–5.45)
RDW: 20.8 % — AB (ref 11.2–14.5)
WBC: 2.9 10*3/uL — ABNORMAL LOW (ref 3.9–10.3)

## 2014-06-28 LAB — COMPREHENSIVE METABOLIC PANEL (CC13)
ALT: 8 U/L (ref 0–55)
ANION GAP: 9 meq/L (ref 3–11)
AST: 14 U/L (ref 5–34)
Albumin: 3.5 g/dL (ref 3.5–5.0)
Alkaline Phosphatase: 69 U/L (ref 40–150)
BILIRUBIN TOTAL: 0.77 mg/dL (ref 0.20–1.20)
BUN: 32.7 mg/dL — ABNORMAL HIGH (ref 7.0–26.0)
CO2: 21 mEq/L — ABNORMAL LOW (ref 22–29)
Calcium: 9.1 mg/dL (ref 8.4–10.4)
Chloride: 108 mEq/L (ref 98–109)
Creatinine: 2.1 mg/dL — ABNORMAL HIGH (ref 0.6–1.1)
EGFR: 22 mL/min/{1.73_m2} — ABNORMAL LOW (ref >90)
Glucose: 97 mg/dl (ref 70–140)
Potassium: 4.4 mEq/L (ref 3.5–5.1)
Sodium: 138 mEq/L (ref 136–145)
TOTAL PROTEIN: 7 g/dL (ref 6.4–8.3)

## 2014-06-28 LAB — CEA: CEA: 24.9 ng/mL — AB (ref 0.0–5.0)

## 2014-06-28 LAB — PREPARE RBC (CROSSMATCH)

## 2014-06-28 LAB — ABO/RH: ABO/RH(D): A POS

## 2014-06-28 LAB — HOLD TUBE, BLOOD BANK

## 2014-06-28 MED ORDER — SODIUM CHLORIDE 0.9 % IV SOLN
250.0000 mL | Freq: Once | INTRAVENOUS | Status: AC
  Administered 2014-06-28: 250 mL via INTRAVENOUS

## 2014-06-28 MED ORDER — DEXAMETHASONE SODIUM PHOSPHATE 100 MG/10ML IJ SOLN
Freq: Once | INTRAMUSCULAR | Status: AC
  Administered 2014-06-28: 12:00:00 via INTRAVENOUS
  Filled 2014-06-28: qty 4

## 2014-06-28 MED ORDER — DEXTROSE 5 % IV SOLN
Freq: Once | INTRAVENOUS | Status: AC
  Administered 2014-06-28: 12:00:00 via INTRAVENOUS

## 2014-06-28 MED ORDER — FLUOROURACIL CHEMO INJECTION 5 GM/100ML
2375.0000 mg/m2 | INTRAVENOUS | Status: DC
  Administered 2014-06-28: 5000 mg via INTRAVENOUS
  Filled 2014-06-28: qty 100

## 2014-06-28 MED ORDER — OXALIPLATIN CHEMO INJECTION 100 MG/20ML
65.0000 mg/m2 | Freq: Once | INTRAVENOUS | Status: AC
  Administered 2014-06-28: 135 mg via INTRAVENOUS
  Filled 2014-06-28: qty 27

## 2014-06-28 MED ORDER — FLUOROURACIL CHEMO INJECTION 2.5 GM/50ML
400.0000 mg/m2 | Freq: Once | INTRAVENOUS | Status: AC
  Administered 2014-06-28: 850 mg via INTRAVENOUS
  Filled 2014-06-28: qty 17

## 2014-06-28 MED ORDER — LEUCOVORIN CALCIUM INJECTION 350 MG
400.0000 mg/m2 | Freq: Once | INTRAVENOUS | Status: AC
  Administered 2014-06-28: 844 mg via INTRAVENOUS
  Filled 2014-06-28: qty 42.2

## 2014-06-28 NOTE — Progress Notes (Signed)
[facility] OFFICE PROGRESS NOTE   Diagnosis: Colon cancer  INTERVAL HISTORY:   Natalie Chapman returns as scheduled. She completed cycle 3 FOLFOX on 06/14/2014. No nausea, mouth sores, or diarrhea. No neuropathy symptoms. She has not noted a significant change in the right abdominal wall mass. She has intermittent areas of erythema at the perineum and skin folds. She reports malaise lasting 4-5 days following chemotherapy.  Objective:  Vital signs in last 24 hours:  Blood pressure 111/63, pulse 96, temperature 97.5 F (36.4 C), temperature source Oral, resp. rate 18, weight 235 lb 11.2 oz (106.913 kg), SpO2 100 %.    HEENT: No thrush or ulcers Resp: Lungs clear bilaterally Cardio: Regular rate and rhythm GI: No hepatomegaly, firm masslike fullness around the right abdominal scar and superior to the scar Vascular: Trace low leg and foot edema bilaterally with chronic stasis change  Skin: Soles without skin breakdown , yeast rash behind the right knee  Portacath/PICC-without erythema  Lab Results:  Lab Results  Component Value Date   WBC 2.9* 06/28/2014   HGB 7.3* 06/28/2014   HCT 22.1* 06/28/2014   MCV 97.5 06/28/2014   PLT 166 06/28/2014   NEUTROABS 2.1 06/28/2014      Lab Results  Component Value Date   CEA 25.8* 05/17/2014    Imaging:  No results found.  Medications: I have reviewed the patient's current medications.  Assessment/Plan: 1. 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 8 of 15 lymph nodes contained metastatic carcinoma, tumor deposits were present; microsatellite stable.  PET scan 08/24/2013 with mildly hypermetabolic small left axillary and right inguinal nodes--most likely unrelated to colon cancer, no clear evidence of metastatic disease.   Cycle 1 adjuvant Xeloda 09/05/2013  Cycle 2 adjuvant Xeloda 09/26/2013  Cycle 3 adjuvant  Xeloda 10/17/2013  Cycle 4 adjuvant Xeloda 11/07/2013  Cycle 5 adjuvant Xeloda 11/28/2013  Cycle 6 adjuvant Xeloda 12/19/2013  Cycle 7 adjuvant Xeloda 01/09/2014  Cycle 8 adjuvant Xeloda 01/30/2014  Status post biopsy of nodularity at the right lower quadrant scar on 03/27/2014 confirming metastatic colon cancer  CT 04/05/2014 with new right hydronephrosis, soft tissue thickening adjacent to the ileocolonic anastomosis and nodularity in the overlying subcutaneous tissue  PET scan 05/02/2014 with hypermetabolic soft tissue adjacent to the ileocolic anastomosis beneath the right abdominal wall. Suspected soft tissue metastasis in the right abdominal wall, peritoneal disease along the right liver and pelvic nodal metastases. Suspected right pelvic implant. Associated mild right hydronephrosis with indwelling ureteral stent.  Cycle 1 FOLFOX 05/17/2014  Cycle 2 FOLFOX 05/31/2014  Cycle 3 FOLFOX 06/14/2014 2. Atrial fibrillation 3. Anemia secondary to bleeding from the colon cancer versus renal insufficiency, and chemotherapy 4. History of tobacco use with COPD changes on the CT 04/07/2013  5. Morbid obesity  6. Status post gastric banding surgery  7. Renal insufficiency  8. Anorexia-etiology unclear. Improved 9. Right hydronephrosis on the CT 04/05/2014. She was referred to urology and underwent placement of a ureter stent on 04/27/2014. Renal function has improved. 10. Port-A-Cath placement 05/15/2014    Disposition:  Natalie Chapman is tolerating the chemotherapy well. The plan is to proceed with cycle 4 of FOLFOX today. She had increased malaise following cycle 3. This may be related to the severe anemia. She agrees to a red cell transfusion.  Natalie Chapman will return for an office visit and chemotherapy in 2 weeks. She will be scheduled for a restaging  CT after cycle 5. We will follow-up on the CEA from today.  Betsy Coder, MD  06/28/2014  10:46 AM

## 2014-06-28 NOTE — Patient Instructions (Signed)
[facility] Discharge Instructions for Patients Receiving Chemotherapy  Today you received the following chemotherapy agents Oxaliplatin,Leucovorin, Adrucil  To help prevent nausea and vomiting after your treatment, we encourage you to take your nausea medication as directed.    If you develop nausea and vomiting that is not controlled by your nausea medication, call the clinic.   BELOW ARE SYMPTOMS THAT SHOULD BE REPORTED IMMEDIATELY:  *FEVER GREATER THAN 100.5 F  *CHILLS WITH OR WITHOUT FEVER  NAUSEA AND VOMITING THAT IS NOT CONTROLLED WITH YOUR NAUSEA MEDICATION  *UNUSUAL SHORTNESS OF BREATH  *UNUSUAL BRUISING OR BLEEDING  TENDERNESS IN MOUTH AND THROAT WITH OR WITHOUT PRESENCE OF ULCERS  *URINARY PROBLEMS  *BOWEL PROBLEMS  UNUSUAL RASH Items with * indicate a potential emergency and should be followed up as soon as possible.  Feel free to call the clinic you have any questions or concerns. The clinic phone number is [phone].  Please show the [facility] at check-in to the Emergency Department and triage nurse.   Blood Transfusion Information WHAT IS A BLOOD TRANSFUSION? A transfusion is the replacement of blood or some of its parts. Blood is made up of multiple cells which provide different functions.  Red blood cells carry oxygen and are used for blood loss replacement.  White blood cells fight against infection.  Platelets control bleeding.  Plasma helps clot blood.  Other blood products are available for specialized needs, such as hemophilia or other clotting disorders. BEFORE THE TRANSFUSION  Who gives blood for transfusions?   You may be able to donate blood to be used at a later date on yourself (autologous donation).  Relatives can be asked to donate blood. This is generally not any safer than if you have received blood from a stranger. The same precautions are taken to ensure safety when a relative's blood is  donated.  Healthy volunteers who are fully evaluated to make sure their blood is safe. This is blood bank blood. Transfusion therapy is the safest it has ever been in the practice of medicine. Before blood is taken from a donor, a complete history is taken to make sure that person has no history of diseases nor engages in risky social behavior (examples are intravenous drug use or sexual activity with multiple partners). The donor's travel history is screened to minimize risk of transmitting infections, such as malaria. The donated blood is tested for signs of infectious diseases, such as HIV and hepatitis. The blood is then tested to be sure it is compatible with you in order to minimize the chance of a transfusion reaction. If you or a relative donates blood, this is often done in anticipation of surgery and is not appropriate for emergency situations. It takes many days to process the donated blood. RISKS AND COMPLICATIONS Although transfusion therapy is very safe and saves many lives, the main dangers of transfusion include:   Getting an infectious disease.  Developing a transfusion reaction. This is an allergic reaction to something in the blood you were given. Every precaution is taken to prevent this. The decision to have a blood transfusion has been considered carefully by your caregiver before blood is given. Blood is not given unless the benefits outweigh the risks. AFTER THE TRANSFUSION  Right after receiving a blood transfusion, you will usually feel much better and more energetic. This is especially true if your red blood cells have gotten low (anemic). The transfusion raises the level of the red blood cells which carry  oxygen, and this usually causes an energy increase.  The nurse administering the transfusion will monitor you carefully for complications. HOME CARE INSTRUCTIONS  No special instructions are needed after a transfusion. You may find your energy is better. Speak with your  caregiver about any limitations on activity for underlying diseases you may have. SEEK MEDICAL CARE IF:   Your condition is not improving after your transfusion.  You develop redness or irritation at the intravenous (IV) site. SEEK IMMEDIATE MEDICAL CARE IF:  Any of the following symptoms occur over the next 12 hours:  Shaking chills.  You have a temperature by mouth above 102 F (38.9 C), not controlled by medicine.  Chest, back, or muscle pain.  People around you feel you are not acting correctly or are confused.  Shortness of breath or difficulty breathing.  Dizziness and fainting.  You get a rash or develop hives.  You have a decrease in urine output.  Your urine turns a dark color or changes to pink, red, or brown. Any of the following symptoms occur over the next 10 days:  You have a temperature by mouth above 102 F (38.9 C), not controlled by medicine.  Shortness of breath.  Weakness after normal activity.  The white part of the eye turns yellow (jaundice).  You have a decrease in the amount of urine or are urinating less often.  Your urine turns a dark color or changes to pink, red, or brown. Document Released: 12/27/1999 Document Revised: 03/23/2011 Document Reviewed: 08/15/2007 [facility] Patient Information 2015 [place], [place]. This information is not intended to replace advice given to you by your health care provider. Make sure you discuss any questions you have with your health care provider.

## 2014-06-28 NOTE — Telephone Encounter (Signed)
gave adn printed appt sched and avs fo rpt for [place] and JULY .Marland KitchenMarland KitchenMarland Kitchen

## 2014-06-28 NOTE — Progress Notes (Signed)
Per Lavella Lemons, Dr. Gearldine Shown RN, labs reviewed by Dr. Benay Spice, okay to proceed with treatment today.  Pt will also receive 2 units of blood. 1 unit today and 1 unit tomorrow on 06/29/14.  Per pharmacy blood needs to start after chemotherapy through PIV due to 5FU pump.  Pt verbalized understanding.  Pt discharged via wheelchair with sister. PIV flushed and saline locked, left accessed for tomorrow's blood transfusion. Pt and sister educated to keep blue blood bracelet on for tomorrow. Verbalized understanding. Knows to call with any concerns.

## 2014-06-29 ENCOUNTER — Telehealth: Payer: Self-pay | Admitting: *Deleted

## 2014-06-29 ENCOUNTER — Ambulatory Visit (HOSPITAL_BASED_OUTPATIENT_CLINIC_OR_DEPARTMENT_OTHER): Payer: Medicare Other

## 2014-06-29 VITALS — BP 117/62 | HR 80 | Temp 97.8°F | Resp 18

## 2014-06-29 DIAGNOSIS — D6481 Anemia due to antineoplastic chemotherapy: Secondary | ICD-10-CM

## 2014-06-29 DIAGNOSIS — C189 Malignant neoplasm of colon, unspecified: Secondary | ICD-10-CM | POA: Diagnosis not present

## 2014-06-29 MED ORDER — SODIUM CHLORIDE 0.9 % IV SOLN
250.0000 mL | Freq: Once | INTRAVENOUS | Status: AC
  Administered 2014-06-29: 250 mL via INTRAVENOUS

## 2014-06-29 NOTE — Telephone Encounter (Signed)
Called pt with CEA results. Stable, per Dr. Benay Spice. Pt voiced understanding.

## 2014-06-29 NOTE — Telephone Encounter (Signed)
-----   Message from Ladell Pier, MD sent at 06/28/2014  6:13 PM EDT ----- Please call patient, cea is stable, continue folfox as scheduled

## 2014-06-29 NOTE — Patient Instructions (Signed)

## 2014-06-30 ENCOUNTER — Ambulatory Visit (HOSPITAL_BASED_OUTPATIENT_CLINIC_OR_DEPARTMENT_OTHER): Payer: Medicare Other

## 2014-06-30 VITALS — BP 115/48 | HR 85 | Temp 98.0°F | Resp 20

## 2014-06-30 DIAGNOSIS — C182 Malignant neoplasm of ascending colon: Secondary | ICD-10-CM

## 2014-06-30 DIAGNOSIS — Z452 Encounter for adjustment and management of vascular access device: Secondary | ICD-10-CM

## 2014-06-30 MED ORDER — SODIUM CHLORIDE 0.9 % IJ SOLN
10.0000 mL | INTRAMUSCULAR | Status: DC | PRN
  Administered 2014-06-30: 10 mL
  Filled 2014-06-30: qty 10

## 2014-06-30 MED ORDER — HEPARIN SOD (PORK) LOCK FLUSH 100 UNIT/ML IV SOLN
500.0000 [IU] | Freq: Once | INTRAVENOUS | Status: AC | PRN
  Administered 2014-06-30: 500 [IU]
  Filled 2014-06-30: qty 5

## 2014-06-30 NOTE — Patient Instructions (Signed)
[facility] Discharge Instructions for Patients Receiving Chemotherapy  Today you received the following chemotherapy agents 5 Fu/Oxaliplatin/Leucovorin To help prevent nausea and vomiting after your treatment, we encourage you to take your nausea medication as prescribed.  If you develop nausea and vomiting that is not controlled by your nausea medication, call the clinic.   BELOW ARE SYMPTOMS THAT SHOULD BE REPORTED IMMEDIATELY:  *FEVER GREATER THAN 100.5 F  *CHILLS WITH OR WITHOUT FEVER  NAUSEA AND VOMITING THAT IS NOT CONTROLLED WITH YOUR NAUSEA MEDICATION  *UNUSUAL SHORTNESS OF BREATH  *UNUSUAL BRUISING OR BLEEDING  TENDERNESS IN MOUTH AND THROAT WITH OR WITHOUT PRESENCE OF ULCERS  *URINARY PROBLEMS  *BOWEL PROBLEMS  UNUSUAL RASH Items with * indicate a potential emergency and should be followed up as soon as possible.  Feel free to call the clinic you have any questions or concerns. The clinic phone number is [phone].  Please show the [facility] at check-in to the Emergency Department and triage nurse.  Leucovorin injection What is this medicine? LEUCOVORIN (loo koe VOR in) is used to prevent or treat the harmful effects of some medicines. This medicine is used to treat anemia caused by a low amount of folic acid in the body. It is also used with 5-fluorouracil (5-FU) to treat colon cancer. This medicine may be used for other purposes; ask your health care provider or pharmacist if you have questions. What should I tell my health care provider before I take this medicine? They need to know if you have any of these conditions: -anemia from low levels of vitamin B-12 in the blood -an unusual or allergic reaction to leucovorin, folic acid, other medicines, foods, dyes, or preservatives -pregnant or trying to get pregnant -breast-feeding How should I use this medicine? This medicine is for injection into a muscle or into a vein. It is given by a  health care professional in a hospital or clinic setting. Talk to your pediatrician regarding the use of this medicine in children. Special care may be needed. Overdosage: If you think you have taken too much of this medicine contact a poison control center or emergency room at once. NOTE: This medicine is only for you. Do not share this medicine with others. What if I miss a dose? This does not apply. What may interact with this medicine? -capecitabine -fluorouracil -phenobarbital -phenytoin -primidone -trimethoprim-sulfamethoxazole This list may not describe all possible interactions. Give your health care provider a list of all the medicines, herbs, non-prescription drugs, or dietary supplements you use. Also tell them if you smoke, drink alcohol, or use illegal drugs. Some items may interact with your medicine. What should I watch for while using this medicine? Your condition will be monitored carefully while you are receiving this medicine. This medicine may increase the side effects of 5-fluorouracil, 5-FU. Tell your doctor or health care professional if you have diarrhea or mouth sores that do not get better or that get worse. What side effects may I notice from receiving this medicine? Side effects that you should report to your doctor or health care professional as soon as possible: -allergic reactions like skin rash, itching or hives, swelling of the face, lips, or tongue -breathing problems -fever, infection -mouth sores -unusual bleeding or bruising -unusually weak or tired Side effects that usually do not require medical attention (report to your doctor or health care professional if they continue or are bothersome): -constipation or diarrhea -loss of appetite -nausea, vomiting This list may  not describe all possible side effects. Call your doctor for medical advice about side effects. You may report side effects to FDA at 1-800-FDA-1088. Where should I keep my  medicine? This drug is given in a hospital or clinic and will not be stored at home. NOTE: This sheet is a summary. It may not cover all possible information. If you have questions about this medicine, talk to your doctor, pharmacist, or health care provider.  2015, Elsevier/Gold Standard. (2007-07-05 16:50:29)   Oxaliplatin Injection What is this medicine? OXALIPLATIN (ox AL i PLA tin) is a chemotherapy drug. It targets fast dividing cells, like cancer cells, and causes these cells to die. This medicine is used to treat cancers of the colon and rectum, and many other cancers. This medicine may be used for other purposes; ask your health care provider or pharmacist if you have questions. COMMON BRAND NAME(S): Eloxatin What should I tell my health care provider before I take this medicine? They need to know if you have any of these conditions: -kidney disease -an unusual or allergic reaction to oxaliplatin, other chemotherapy, other medicines, foods, dyes, or preservatives -pregnant or trying to get pregnant -breast-feeding How should I use this medicine? This drug is given as an infusion into a vein. It is administered in a hospital or clinic by a specially trained health care professional. Talk to your pediatrician regarding the use of this medicine in children. Special care may be needed. Overdosage: If you think you have taken too much of this medicine contact a poison control center or emergency room at once. NOTE: This medicine is only for you. Do not share this medicine with others. What if I miss a dose? It is important not to miss a dose. Call your doctor or health care professional if you are unable to keep an appointment. What may interact with this medicine? -medicines to increase blood counts like filgrastim, pegfilgrastim, sargramostim -probenecid -some antibiotics like amikacin, gentamicin, neomycin, polymyxin B, streptomycin, tobramycin -zalcitabine Talk to your doctor or  health care professional before taking any of these medicines: -acetaminophen -aspirin -ibuprofen -ketoprofen -naproxen This list may not describe all possible interactions. Give your health care provider a list of all the medicines, herbs, non-prescription drugs, or dietary supplements you use. Also tell them if you smoke, drink alcohol, or use illegal drugs. Some items may interact with your medicine. What should I watch for while using this medicine? Your condition will be monitored carefully while you are receiving this medicine. You will need important blood work done while you are taking this medicine. This medicine can make you more sensitive to cold. Do not drink cold drinks or use ice. Cover exposed skin before coming in contact with cold temperatures or cold objects. When out in cold weather wear warm clothing and cover your mouth and nose to warm the air that goes into your lungs. Tell your doctor if you get sensitive to the cold. This drug may make you feel generally unwell. This is not uncommon, as chemotherapy can affect healthy cells as well as cancer cells. Report any side effects. Continue your course of treatment even though you feel ill unless your doctor tells you to stop. In some cases, you may be given additional medicines to help with side effects. Follow all directions for their use. Call your doctor or health care professional for advice if you get a fever, chills or sore throat, or other symptoms of a cold or flu. Do not treat yourself. This drug  decreases your body's ability to fight infections. Try to avoid being around people who are sick. This medicine may increase your risk to bruise or bleed. Call your doctor or health care professional if you notice any unusual bleeding. Be careful brushing and flossing your teeth or using a toothpick because you may get an infection or bleed more easily. If you have any dental work done, tell your dentist you are receiving this  medicine. Avoid taking products that contain aspirin, acetaminophen, ibuprofen, naproxen, or ketoprofen unless instructed by your doctor. These medicines may hide a fever. Do not become pregnant while taking this medicine. Women should inform their doctor if they wish to become pregnant or think they might be pregnant. There is a potential for serious side effects to an unborn child. Talk to your health care professional or pharmacist for more information. Do not breast-feed an infant while taking this medicine. Call your doctor or health care professional if you get diarrhea. Do not treat yourself. What side effects may I notice from receiving this medicine? Side effects that you should report to your doctor or health care professional as soon as possible: -allergic reactions like skin rash, itching or hives, swelling of the face, lips, or tongue -low blood counts - This drug may decrease the number of white blood cells, red blood cells and platelets. You may be at increased risk for infections and bleeding. -signs of infection - fever or chills, cough, sore throat, pain or difficulty passing urine -signs of decreased platelets or bleeding - bruising, pinpoint red spots on the skin, black, tarry stools, nosebleeds -signs of decreased red blood cells - unusually weak or tired, fainting spells, lightheadedness -breathing problems -chest pain, pressure -cough -diarrhea -jaw tightness -mouth sores -nausea and vomiting -pain, swelling, redness or irritation at the injection site -pain, tingling, numbness in the hands or feet -problems with balance, talking, walking -redness, blistering, peeling or loosening of the skin, including inside the mouth -trouble passing urine or change in the amount of urine Side effects that usually do not require medical attention (report to your doctor or health care professional if they continue or are bothersome): -changes in vision -constipation -hair  loss -loss of appetite -metallic taste in the mouth or changes in taste -stomach pain This list may not describe all possible side effects. Call your doctor for medical advice about side effects. You may report side effects to FDA at 1-800-FDA-1088. Where should I keep my medicine? This drug is given in a hospital or clinic and will not be stored at home. NOTE: This sheet is a summary. It may not cover all possible information. If you have questions about this medicine, talk to your doctor, pharmacist, or health care provider.  2015, Elsevier/Gold Standard. (2007-07-26 17:22:47)  Fluorouracil, 5-FU injection What is this medicine? FLUOROURACIL, 5-FU (flure oh YOOR a sil) is a chemotherapy drug. It slows the growth of cancer cells. This medicine is used to treat many types of cancer like breast cancer, colon or rectal cancer, pancreatic cancer, and stomach cancer. This medicine may be used for other purposes; ask your health care provider or pharmacist if you have questions. COMMON BRAND NAME(S): Adrucil What should I tell my health care provider before I take this medicine? They need to know if you have any of these conditions: -blood disorders -dihydropyrimidine dehydrogenase (DPD) deficiency -infection (especially a virus infection such as chickenpox, cold sores, or herpes) -kidney disease -liver disease -malnourished, poor nutrition -recent or ongoing radiation therapy -an  unusual or allergic reaction to fluorouracil, other chemotherapy, other medicines, foods, dyes, or preservatives -pregnant or trying to get pregnant -breast-feeding How should I use this medicine? This drug is given as an infusion or injection into a vein. It is administered in a hospital or clinic by a specially trained health care professional. Talk to your pediatrician regarding the use of this medicine in children. Special care may be needed. Overdosage: If you think you have taken too much of this medicine  contact a poison control center or emergency room at once. NOTE: This medicine is only for you. Do not share this medicine with others. What if I miss a dose? It is important not to miss your dose. Call your doctor or health care professional if you are unable to keep an appointment. What may interact with this medicine? -allopurinol -cimetidine -dapsone -digoxin -hydroxyurea -leucovorin -levamisole -medicines for seizures like ethotoin, fosphenytoin, phenytoin -medicines to increase blood counts like filgrastim, pegfilgrastim, sargramostim -medicines that treat or prevent blood clots like warfarin, enoxaparin, and dalteparin -methotrexate -metronidazole -pyrimethamine -some other chemotherapy drugs like busulfan, cisplatin, estramustine, vinblastine -trimethoprim -trimetrexate -vaccines Talk to your doctor or health care professional before taking any of these medicines: -acetaminophen -aspirin -ibuprofen -ketoprofen -naproxen This list may not describe all possible interactions. Give your health care provider a list of all the medicines, herbs, non-prescription drugs, or dietary supplements you use. Also tell them if you smoke, drink alcohol, or use illegal drugs. Some items may interact with your medicine. What should I watch for while using this medicine? Visit your doctor for checks on your progress. This drug may make you feel generally unwell. This is not uncommon, as chemotherapy can affect healthy cells as well as cancer cells. Report any side effects. Continue your course of treatment even though you feel ill unless your doctor tells you to stop. In some cases, you may be given additional medicines to help with side effects. Follow all directions for their use. Call your doctor or health care professional for advice if you get a fever, chills or sore throat, or other symptoms of a cold or flu. Do not treat yourself. This drug decreases your body's ability to fight  infections. Try to avoid being around people who are sick. This medicine may increase your risk to bruise or bleed. Call your doctor or health care professional if you notice any unusual bleeding. Be careful brushing and flossing your teeth or using a toothpick because you may get an infection or bleed more easily. If you have any dental work done, tell your dentist you are receiving this medicine. Avoid taking products that contain aspirin, acetaminophen, ibuprofen, naproxen, or ketoprofen unless instructed by your doctor. These medicines may hide a fever. Do not become pregnant while taking this medicine. Women should inform their doctor if they wish to become pregnant or think they might be pregnant. There is a potential for serious side effects to an unborn child. Talk to your health care professional or pharmacist for more information. Do not breast-feed an infant while taking this medicine. Men should inform their doctor if they wish to father a child. This medicine may lower sperm counts. Do not treat diarrhea with over the counter products. Contact your doctor if you have diarrhea that lasts more than 2 days or if it is severe and watery. This medicine can make you more sensitive to the sun. Keep out of the sun. If you cannot avoid being in the sun, wear protective clothing and  use sunscreen. Do not use sun lamps or tanning beds/booths. What side effects may I notice from receiving this medicine? Side effects that you should report to your doctor or health care professional as soon as possible: -allergic reactions like skin rash, itching or hives, swelling of the face, lips, or tongue -low blood counts - this medicine may decrease the number of white blood cells, red blood cells and platelets. You may be at increased risk for infections and bleeding. -signs of infection - fever or chills, cough, sore throat, pain or difficulty passing urine -signs of decreased platelets or bleeding - bruising,  pinpoint red spots on the skin, black, tarry stools, blood in the urine -signs of decreased red blood cells - unusually weak or tired, fainting spells, lightheadedness -breathing problems -changes in vision -chest pain -mouth sores -nausea and vomiting -pain, swelling, redness at site where injected -pain, tingling, numbness in the hands or feet -redness, swelling, or sores on hands or feet -stomach pain -unusual bleeding Side effects that usually do not require medical attention (report to your doctor or health care professional if they continue or are bothersome): -changes in finger or toe nails -diarrhea -dry or itchy skin -hair loss -headache -loss of appetite -sensitivity of eyes to the light -stomach upset -unusually teary eyes This list may not describe all possible side effects. Call your doctor for medical advice about side effects. You may report side effects to FDA at 1-800-FDA-1088. Where should I keep my medicine? This drug is given in a hospital or clinic and will not be stored at home. NOTE: This sheet is a summary. It may not cover all possible information. If you have questions about this medicine, talk to your doctor, pharmacist, or health care provider.  2015, Elsevier/Gold Standard. (2007-05-04 13:53:16)

## 2014-07-01 LAB — TYPE AND SCREEN
ABO/RH(D): A POS
Antibody Screen: NEGATIVE
Unit division: 0
Unit division: 0

## 2014-07-04 ENCOUNTER — Telehealth: Payer: Self-pay | Admitting: Oncology

## 2014-07-04 ENCOUNTER — Other Ambulatory Visit: Payer: Self-pay | Admitting: Urology

## 2014-07-04 NOTE — Telephone Encounter (Signed)
Patient called wanting to know if it is safe for her to be put to sleep for a procedure she is having while she is actively receiving chemo. Patient informed she will receive a call back re her question. Message routed to triage.

## 2014-07-05 NOTE — Telephone Encounter (Signed)
Pt is instructed to ask Natalie Chapman on 6/30 about her appt on 7/21 for chemo and 7/22 for stent replacement. Pt wrote it in her calendar.

## 2014-07-08 ENCOUNTER — Other Ambulatory Visit: Payer: Self-pay | Admitting: Oncology

## 2014-07-09 ENCOUNTER — Telehealth: Payer: Self-pay | Admitting: *Deleted

## 2014-07-09 NOTE — Telephone Encounter (Signed)
Please check on her 6/28, she can take imodium

## 2014-07-09 NOTE — Telephone Encounter (Signed)
"  I feel bad and need to know what to do.  I felt good after the blood transfusion and chemotherapy 06-29-2014.  Now I am nauseated and when I try to eat or drink I feel like I could throw up within thirty minutes.  Having diarrhea for the past week.  Can I take imodium?  Should I drink Boost?  I can't talk to people or stay awake."   With further assessment, denies emesis.  Having one loose stool per day. "Three times I've messed all over myself leaking with a trail when I do have to go.  When I lean over to clean it up I get nauseated.  Don't know what to do to help my stomach."  Has taken two doses of compazine the last was about 11:00 pm.  Denies ability to come in today.  "I'll be in Thursday"  Will notify providers.    This nurse advised to take compazine now and repeat every six hours or thirty minutes before meals and at bedtime.  Imodium goal is to have her go without BM for at least 12 hours.  Reviewed foods to avoid for loose stools (milk, greasy, spicy) and to try crackers, broth/chicken rice soup for nausea.

## 2014-07-10 ENCOUNTER — Inpatient Hospital Stay (HOSPITAL_COMMUNITY)
Admission: AD | Admit: 2014-07-10 | Discharge: 2014-07-21 | DRG: 374 | Disposition: A | Discharge status: Hospice | Payer: Medicare Other | Source: Ambulatory Visit | Attending: Internal Medicine | Admitting: Internal Medicine

## 2014-07-10 ENCOUNTER — Other Ambulatory Visit (HOSPITAL_BASED_OUTPATIENT_CLINIC_OR_DEPARTMENT_OTHER): Payer: Medicare Other

## 2014-07-10 ENCOUNTER — Telehealth: Payer: Self-pay | Admitting: Oncology

## 2014-07-10 ENCOUNTER — Ambulatory Visit (HOSPITAL_BASED_OUTPATIENT_CLINIC_OR_DEPARTMENT_OTHER): Payer: Medicare Other | Admitting: Oncology

## 2014-07-10 ENCOUNTER — Other Ambulatory Visit: Payer: Self-pay | Admitting: *Deleted

## 2014-07-10 ENCOUNTER — Encounter: Payer: Self-pay | Admitting: Oncology

## 2014-07-10 ENCOUNTER — Encounter (HOSPITAL_COMMUNITY): Payer: Self-pay | Admitting: Neurology

## 2014-07-10 ENCOUNTER — Inpatient Hospital Stay (HOSPITAL_COMMUNITY): Payer: Medicare Other

## 2014-07-10 VITALS — BP 122/48 | HR 98 | Temp 98.4°F | Resp 16

## 2014-07-10 DIAGNOSIS — D61810 Antineoplastic chemotherapy induced pancytopenia: Secondary | ICD-10-CM | POA: Diagnosis not present

## 2014-07-10 DIAGNOSIS — N1330 Unspecified hydronephrosis: Secondary | ICD-10-CM | POA: Diagnosis present

## 2014-07-10 DIAGNOSIS — Z515 Encounter for palliative care: Secondary | ICD-10-CM | POA: Diagnosis not present

## 2014-07-10 DIAGNOSIS — C7989 Secondary malignant neoplasm of other specified sites: Secondary | ICD-10-CM | POA: Diagnosis not present

## 2014-07-10 DIAGNOSIS — E860 Dehydration: Secondary | ICD-10-CM | POA: Diagnosis present

## 2014-07-10 DIAGNOSIS — E875 Hyperkalemia: Secondary | ICD-10-CM | POA: Diagnosis not present

## 2014-07-10 DIAGNOSIS — R197 Diarrhea, unspecified: Secondary | ICD-10-CM

## 2014-07-10 DIAGNOSIS — C189 Malignant neoplasm of colon, unspecified: Secondary | ICD-10-CM

## 2014-07-10 DIAGNOSIS — E440 Moderate protein-calorie malnutrition: Secondary | ICD-10-CM | POA: Diagnosis present

## 2014-07-10 DIAGNOSIS — I129 Hypertensive chronic kidney disease with stage 1 through stage 4 chronic kidney disease, or unspecified chronic kidney disease: Secondary | ICD-10-CM | POA: Diagnosis present

## 2014-07-10 DIAGNOSIS — Z7982 Long term (current) use of aspirin: Secondary | ICD-10-CM | POA: Diagnosis not present

## 2014-07-10 DIAGNOSIS — E871 Hypo-osmolality and hyponatremia: Secondary | ICD-10-CM | POA: Diagnosis not present

## 2014-07-10 DIAGNOSIS — N289 Disorder of kidney and ureter, unspecified: Secondary | ICD-10-CM

## 2014-07-10 DIAGNOSIS — R109 Unspecified abdominal pain: Secondary | ICD-10-CM

## 2014-07-10 DIAGNOSIS — C778 Secondary and unspecified malignant neoplasm of lymph nodes of multiple regions: Secondary | ICD-10-CM | POA: Diagnosis not present

## 2014-07-10 DIAGNOSIS — I4891 Unspecified atrial fibrillation: Secondary | ICD-10-CM

## 2014-07-10 DIAGNOSIS — Z9889 Other specified postprocedural states: Secondary | ICD-10-CM

## 2014-07-10 DIAGNOSIS — I482 Chronic atrial fibrillation: Secondary | ICD-10-CM | POA: Diagnosis not present

## 2014-07-10 DIAGNOSIS — R112 Nausea with vomiting, unspecified: Secondary | ICD-10-CM

## 2014-07-10 DIAGNOSIS — C182 Malignant neoplasm of ascending colon: Secondary | ICD-10-CM

## 2014-07-10 DIAGNOSIS — C779 Secondary and unspecified malignant neoplasm of lymph node, unspecified: Secondary | ICD-10-CM | POA: Diagnosis present

## 2014-07-10 DIAGNOSIS — Z90710 Acquired absence of both cervix and uterus: Secondary | ICD-10-CM | POA: Diagnosis not present

## 2014-07-10 DIAGNOSIS — T451X5A Adverse effect of antineoplastic and immunosuppressive drugs, initial encounter: Secondary | ICD-10-CM | POA: Diagnosis present

## 2014-07-10 DIAGNOSIS — D630 Anemia in neoplastic disease: Secondary | ICD-10-CM | POA: Diagnosis present

## 2014-07-10 DIAGNOSIS — Z87891 Personal history of nicotine dependence: Secondary | ICD-10-CM | POA: Diagnosis not present

## 2014-07-10 DIAGNOSIS — C800 Disseminated malignant neoplasm, unspecified: Secondary | ICD-10-CM | POA: Diagnosis present

## 2014-07-10 DIAGNOSIS — D631 Anemia in chronic kidney disease: Secondary | ICD-10-CM | POA: Diagnosis present

## 2014-07-10 DIAGNOSIS — L309 Dermatitis, unspecified: Secondary | ICD-10-CM | POA: Diagnosis present

## 2014-07-10 DIAGNOSIS — K5669 Other intestinal obstruction: Secondary | ICD-10-CM | POA: Diagnosis not present

## 2014-07-10 DIAGNOSIS — L8990 Pressure ulcer of unspecified site, unspecified stage: Secondary | ICD-10-CM | POA: Insufficient documentation

## 2014-07-10 DIAGNOSIS — Z66 Do not resuscitate: Secondary | ICD-10-CM | POA: Diagnosis not present

## 2014-07-10 DIAGNOSIS — J449 Chronic obstructive pulmonary disease, unspecified: Secondary | ICD-10-CM | POA: Diagnosis not present

## 2014-07-10 DIAGNOSIS — Z79899 Other long term (current) drug therapy: Secondary | ICD-10-CM | POA: Diagnosis not present

## 2014-07-10 DIAGNOSIS — D649 Anemia, unspecified: Secondary | ICD-10-CM | POA: Diagnosis present

## 2014-07-10 DIAGNOSIS — L308 Other specified dermatitis: Secondary | ICD-10-CM | POA: Diagnosis not present

## 2014-07-10 DIAGNOSIS — R17 Unspecified jaundice: Secondary | ICD-10-CM | POA: Diagnosis present

## 2014-07-10 DIAGNOSIS — K5660 Unspecified intestinal obstruction: Secondary | ICD-10-CM | POA: Diagnosis not present

## 2014-07-10 DIAGNOSIS — R1111 Vomiting without nausea: Secondary | ICD-10-CM | POA: Diagnosis not present

## 2014-07-10 DIAGNOSIS — R110 Nausea: Secondary | ICD-10-CM | POA: Diagnosis not present

## 2014-07-10 DIAGNOSIS — K565 Intestinal adhesions [bands] with obstruction (postprocedural) (postinfection): Secondary | ICD-10-CM | POA: Diagnosis present

## 2014-07-10 DIAGNOSIS — K56609 Unspecified intestinal obstruction, unspecified as to partial versus complete obstruction: Secondary | ICD-10-CM | POA: Insufficient documentation

## 2014-07-10 DIAGNOSIS — E46 Unspecified protein-calorie malnutrition: Secondary | ICD-10-CM

## 2014-07-10 DIAGNOSIS — Z789 Other specified health status: Secondary | ICD-10-CM | POA: Diagnosis not present

## 2014-07-10 DIAGNOSIS — N183 Chronic kidney disease, stage 3 unspecified: Secondary | ICD-10-CM | POA: Diagnosis present

## 2014-07-10 DIAGNOSIS — C787 Secondary malignant neoplasm of liver and intrahepatic bile duct: Secondary | ICD-10-CM

## 2014-07-10 DIAGNOSIS — C786 Secondary malignant neoplasm of retroperitoneum and peritoneum: Secondary | ICD-10-CM | POA: Diagnosis not present

## 2014-07-10 DIAGNOSIS — D701 Agranulocytosis secondary to cancer chemotherapy: Secondary | ICD-10-CM | POA: Diagnosis not present

## 2014-07-10 DIAGNOSIS — Z6841 Body Mass Index (BMI) 40.0 and over, adult: Secondary | ICD-10-CM | POA: Diagnosis not present

## 2014-07-10 DIAGNOSIS — Z9049 Acquired absence of other specified parts of digestive tract: Secondary | ICD-10-CM | POA: Diagnosis present

## 2014-07-10 DIAGNOSIS — D709 Neutropenia, unspecified: Secondary | ICD-10-CM | POA: Diagnosis present

## 2014-07-10 DIAGNOSIS — Z9884 Bariatric surgery status: Secondary | ICD-10-CM | POA: Diagnosis not present

## 2014-07-10 DIAGNOSIS — R1110 Vomiting, unspecified: Secondary | ICD-10-CM

## 2014-07-10 DIAGNOSIS — S31801A Laceration without foreign body of unspecified buttock, initial encounter: Secondary | ICD-10-CM | POA: Diagnosis present

## 2014-07-10 HISTORY — DX: Reserved for concepts with insufficient information to code with codable children: IMO0002

## 2014-07-10 LAB — COMPREHENSIVE METABOLIC PANEL (CC13)
ALBUMIN: 3.2 g/dL — AB (ref 3.5–5.0)
ALK PHOS: 73 U/L (ref 40–150)
AST: 12 U/L (ref 5–34)
Anion Gap: 13 mEq/L — ABNORMAL HIGH (ref 3–11)
BUN: 34.1 mg/dL — ABNORMAL HIGH (ref 7.0–26.0)
CO2: 20 mEq/L — ABNORMAL LOW (ref 22–29)
Calcium: 8.8 mg/dL (ref 8.4–10.4)
Chloride: 105 mEq/L (ref 98–109)
Creatinine: 2.2 mg/dL — ABNORMAL HIGH (ref 0.6–1.1)
EGFR: 22 mL/min/{1.73_m2} — ABNORMAL LOW (ref >90)
Glucose: 140 mg/dl (ref 70–140)
POTASSIUM: 3.3 meq/L — AB (ref 3.5–5.1)
SODIUM: 138 meq/L (ref 136–145)
TOTAL PROTEIN: 6.8 g/dL (ref 6.4–8.3)
Total Bilirubin: 1.21 mg/dL — ABNORMAL HIGH (ref 0.20–1.20)

## 2014-07-10 LAB — CBC WITH DIFFERENTIAL/PLATELET
BASO%: 0.1 % (ref 0.0–2.0)
BASOS ABS: 0 10*3/uL (ref 0.0–0.1)
EOS%: 0.1 % (ref 0.0–7.0)
Eosinophils Absolute: 0 10*3/uL (ref 0.0–0.5)
HCT: 26.8 % — ABNORMAL LOW (ref 34.8–46.6)
HEMOGLOBIN: 9 g/dL — AB (ref 11.6–15.9)
LYMPH%: 9.5 % — ABNORMAL LOW (ref 14.0–49.7)
MCH: 31.5 pg (ref 25.1–34.0)
MCHC: 33.6 g/dL (ref 31.5–36.0)
MCV: 93.9 fL (ref 79.5–101.0)
MONO#: 0.6 10*3/uL (ref 0.1–0.9)
MONO%: 26.6 % — ABNORMAL HIGH (ref 0.0–14.0)
NEUT#: 1.4 10*3/uL — ABNORMAL LOW (ref 1.5–6.5)
NEUT%: 63.7 % (ref 38.4–76.8)
Platelets: 148 10*3/uL (ref 145–400)
RBC: 2.85 10*6/uL — ABNORMAL LOW (ref 3.70–5.45)
RDW: 20.7 % — ABNORMAL HIGH (ref 11.2–14.5)
WBC: 2.2 10*3/uL — ABNORMAL LOW (ref 3.9–10.3)
lymph#: 0.2 10*3/uL — ABNORMAL LOW (ref 0.9–3.3)

## 2014-07-10 LAB — URINALYSIS W MICROSCOPIC (NOT AT ARMC)
Glucose, UA: NEGATIVE mg/dL
Ketones, ur: NEGATIVE mg/dL
Nitrite: NEGATIVE
PROTEIN: 30 mg/dL — AB
Specific Gravity, Urine: 1.016 (ref 1.005–1.030)
Urobilinogen, UA: 0.2 mg/dL (ref 0.0–1.0)
pH: 5 (ref 5.0–8.0)

## 2014-07-10 LAB — CBC
HCT: 23.7 % — ABNORMAL LOW (ref 36.0–46.0)
HEMOGLOBIN: 8.1 g/dL — AB (ref 12.0–15.0)
MCH: 32 pg (ref 26.0–34.0)
MCHC: 34.2 g/dL (ref 30.0–36.0)
MCV: 93.7 fL (ref 78.0–100.0)
PLATELETS: 131 10*3/uL — AB (ref 150–400)
RBC: 2.53 MIL/uL — AB (ref 3.87–5.11)
RDW: 18.5 % — ABNORMAL HIGH (ref 11.5–15.5)
WBC: 2.1 10*3/uL — ABNORMAL LOW (ref 4.0–10.5)

## 2014-07-10 LAB — CREATININE, SERUM
Creatinine, Ser: 2.24 mg/dL — ABNORMAL HIGH (ref 0.44–1.00)
GFR calc non Af Amer: 20 mL/min — ABNORMAL LOW (ref >60)
GFR, EST AFRICAN AMERICAN: 24 mL/min — AB (ref >60)

## 2014-07-10 LAB — LIPASE, BLOOD: LIPASE: 15 U/L — AB (ref 22–51)

## 2014-07-10 MED ORDER — OXYCODONE HCL 5 MG PO TABS: 5.0000 mg | ORAL_TABLET | ORAL | Status: DC | PRN

## 2014-07-10 MED ORDER — MORPHINE SULFATE 2 MG/ML IJ SOLN
1.0000 mg | INTRAMUSCULAR | Status: DC | PRN
  Administered 2014-07-17 – 2014-07-21 (×6): 1 mg via INTRAVENOUS
  Filled 2014-07-10 (×7): qty 1

## 2014-07-10 MED ORDER — ONDANSETRON HCL 4 MG/2ML IJ SOLN
4.0000 mg | Freq: Four times a day (QID) | INTRAMUSCULAR | Status: DC | PRN
Start: 2014-07-10 — End: 2014-07-15
  Administered 2014-07-12 – 2014-07-15 (×5): 4 mg via INTRAVENOUS
  Filled 2014-07-10 (×5): qty 2

## 2014-07-10 MED ORDER — LIDOCAINE-PRILOCAINE 2.5-2.5 % EX CREA: 1.0000 "application " | TOPICAL_CREAM | CUTANEOUS | Status: DC | PRN

## 2014-07-10 MED ORDER — POTASSIUM CHLORIDE 2 MEQ/ML IV SOLN
INTRAVENOUS | Status: DC
  Administered 2014-07-10 – 2014-07-11 (×3): via INTRAVENOUS
  Filled 2014-07-10 (×4): qty 1000

## 2014-07-10 MED ORDER — ONDANSETRON HCL 4 MG PO TABS: 4.0000 mg | ORAL_TABLET | Freq: Four times a day (QID) | ORAL | Status: DC | PRN

## 2014-07-10 MED ORDER — METOPROLOL SUCCINATE ER 25 MG PO TB24
25.0000 mg | ORAL_TABLET | Freq: Every morning | ORAL | Status: DC
  Filled 2014-07-10: qty 1

## 2014-07-10 MED ORDER — GUAIFENESIN-DM 100-10 MG/5ML PO SYRP: 5.0000 mL | ORAL_SOLUTION | ORAL | Status: DC | PRN

## 2014-07-10 MED ORDER — SODIUM CHLORIDE 0.9 % IV SOLN
INTRAVENOUS | Status: DC
  Administered 2014-07-10: 14:00:00 via INTRAVENOUS

## 2014-07-10 MED ORDER — ACETAMINOPHEN 650 MG RE SUPP: 650.0000 mg | Freq: Four times a day (QID) | RECTAL | Status: DC | PRN

## 2014-07-10 MED ORDER — ATORVASTATIN CALCIUM 10 MG PO TABS
5.0000 mg | ORAL_TABLET | Freq: Every day | ORAL | Status: DC
  Administered 2014-07-11: 5 mg via ORAL
  Filled 2014-07-10 (×2): qty 0.5

## 2014-07-10 MED ORDER — ALBUTEROL SULFATE (2.5 MG/3ML) 0.083% IN NEBU: 2.5000 mg | INHALATION_SOLUTION | RESPIRATORY_TRACT | Status: DC | PRN

## 2014-07-10 MED ORDER — ASPIRIN EC 81 MG PO TBEC
81.0000 mg | DELAYED_RELEASE_TABLET | Freq: Every day | ORAL | Status: DC
  Administered 2014-07-11 – 2014-07-21 (×9): 81 mg via ORAL
  Filled 2014-07-10 (×11): qty 1

## 2014-07-10 MED ORDER — ACETAMINOPHEN 325 MG PO TABS
650.0000 mg | ORAL_TABLET | Freq: Four times a day (QID) | ORAL | Status: DC | PRN
  Administered 2014-07-12: 650 mg via ORAL
  Filled 2014-07-10: qty 2

## 2014-07-10 MED ORDER — ENOXAPARIN SODIUM 40 MG/0.4ML ~~LOC~~ SOLN
40.0000 mg | SUBCUTANEOUS | Status: DC
  Administered 2014-07-10: 40 mg via SUBCUTANEOUS
  Filled 2014-07-10 (×2): qty 0.4

## 2014-07-10 NOTE — H&P (Addendum)
PATIENT DETAILS Name: Natalie Chapman Age: 74 y.o. Sex: female Date of Birth: July 28, 1940 Admit Date: 07/10/2014 TOI:ZTIWP, Mallie Mussel, MD Referring Physician:Dr Benay Spice   CHIEF COMPLAINT:  Vomiting and diarrhea-since this past thursday  HPI: Natalie Chapman is a 74 y.o. female with a Past Medical History of colon cancer undergoing chemotherapy (last chemotherapy on 6/16), chronic knee disease stage III, history of right-sided hydronephrosis underwent ureteral stent placement (due for change on 08/03/14) who presents today with the above noted complaint. Per patient, for the past 4-5 days she has had slowly worsening nausea vomiting and persistent diarrhea. She denies any fever or abdominal pain. Family has noted that she is becoming much more weak. Per family, stools have some mucus, but no blood. Family describes 2-3 episodes of loose watery stools on a daily basis and sometimes slightly more. Vomitus is mostly bilious with no blood. She was brought in for an unscheduled visit with her oncologist today, who promptly referred her to the hospitalist service for admission. Patient denied any chest pain or shortness of breath. Denies any dysuria or hematuria.  ALLERGIES:  No Known Allergies  PAST MEDICAL HISTORY: Past Medical History  Diagnosis Date  . Hypertension   . Anemia   . H/O hiatal hernia   . Chronic atrial fibrillation     CARDIOLOGIST-- DR Johnsie Cancel  . Arthritis     LEFT HIP  . Generalized weakness   . Colon cancer dx mar  2015    Stage IIIc, pT4a  pN2b, moderate differentiated adenocarcinoma right colon with 8of15 +lymph nodes for metastatic carcinoma--  s/p  right colectomy 07-20-2013;  chemotherapy 09-05-2013 to 01-30-2014  . 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 Poor appetite   . Frequency of urination   . Wears glasses   . Full dentures   . Dysrhythmia     AFIB  . Chronic kidney disease (CKD), stage I   . Hydronephrosis, right     PAST SURGICAL  HISTORY: Past Surgical History  Procedure Laterality Date  . Colonoscopy N/A 05/02/2013    Procedure: COLONOSCOPY;  Surgeon: Jeryl Columbia, MD;  Location: [facility];  Service: Endoscopy;  Laterality: N/A;  . Esophagogastroduodenoscopy endoscopy    . Incisional hernia repair  1980's  . Laparoscopic partial colectomy N/A 07/20/2013    Procedure: LAPAROSCOPIC  ASSISTED RIGHT COLECTOMY;  Surgeon: Odis Hollingshead, MD;  Location: [place];  Service: General;  Laterality: N/A;  . Gastric restriction surgery  [phone]   [facility]  . Transthoracic echocardiogram  06-20-2013   dr Johnsie Cancel    severe LAE/  ef 55-60%/  mild MR and PR/  moderate TR/  moderate AV calcification without stenosis  . Cholecystectomy  1980's  . Tubal ligation  1973  . Tonsillectomy  as child  . Cystoscopy w/ ureteral stent placement Right 04/27/2014    Procedure: CYSTOSCOPY WITH RIGHT URETERAL STENT PLACEMENT;  Surgeon: Ardis Hughs, MD;  Location: [facility];  Service: Urology;  Laterality: Right;  . Portacath placement Left 05/15/2014    Procedure: INSERTION PORT-A-CATH WITH ULTRASOUND;  Surgeon: Jackolyn Confer, MD;  Location: [place];  Service: General;  Laterality: Left;  . Operative ultrasound Left 05/15/2014    Procedure: OPERATIVE ULTRASOUND;  Surgeon: Jackolyn Confer, MD;  Location: [place];  Service: General;  Laterality: Left;    MEDICATIONS AT HOME: Prior to Admission medications   Medication Sig Start Date End Date Taking? Authorizing Provider  acetaminophen (TYLENOL)  650 MG CR tablet Take 1,300 mg by mouth every 8 (eight) hours as needed for pain.     Historical Provider, MD  acetaminophen-codeine (TYLENOL #3) 300-30 MG per tablet Take 1 tablet by mouth every 4 (four) hours as needed. Patient not taking: Reported on 05/31/2014 04/27/14   Ardis Hughs, MD  aspirin EC 81 MG tablet Take 81 mg by mouth daily.    Historical Provider, MD  atorvastatin (LIPITOR) 10 MG tablet Take 5 mg by mouth daily with  breakfast.     Historical Provider, MD  docusate sodium (COLACE) 100 MG capsule Take 100 mg by mouth daily as needed for mild constipation.    Historical Provider, MD  lidocaine-prilocaine (EMLA) cream Apply 1 application topically as needed. APPLY TO PORTACATH 1-2 HOURS PRIOR TO USE 05/03/14   Owens Shark, NP  metoprolol succinate (TOPROL-XL) 25 MG 24 hr tablet Take 25 mg by mouth every morning.     Historical Provider, MD  Multiple Vitamins-Iron (MULTIVITAMIN/IRON PO) Take 1 tablet by mouth daily.    Historical Provider, MD  phenazopyridine (PYRIDIUM) 200 MG tablet  04/27/14   Historical Provider, MD  polyethylene glycol (MIRALAX / GLYCOLAX) packet Take 17 g by mouth daily as needed.     Historical Provider, MD  prochlorperazine (COMPAZINE) 5 MG tablet Take 1 tablet (5 mg total) by mouth every 6 (six) hours as needed for nausea or vomiting. Patient not taking: Reported on 06/14/2014 05/03/14   Owens Shark, NP    FAMILY HISTORY: Family History  Problem Relation Age of Onset  . Heart disease Mother   . Heart disease Father     SOCIAL HISTORY:  reports that she quit smoking about 24 years ago. Her smoking use included Cigarettes. She has a 30 pack-year smoking history. She has never used smokeless tobacco. She reports that she does not drink alcohol or use illicit drugs. Lives at: Home Mobility: Cane/Walker  REVIEW OF SYSTEMS:  Constitutional:   No  weight loss, Fevers, fatigue.  HEENT:    No headaches, Dysphagia,Tooth/dental problems,Sore throat,   Cardio-vascular: No chest pain,Orthopnea, PND, palpitations  GI:  No heartburn, indigestion,  melena or hematochezia  Resp: No shortness of breath, cough, hemoptysis,plueritic chest pain.   Skin:  No rash or lesions.  GU:  No dysuria, change in color of urine, no urgency or frequency.    Musculoskeletal: No joint pain or swelling.  No decreased range of motion.    Endocrine: No heat intolerance, no cold intolerance, no  polyuria, no polydipsia  Psych: No change in mood or affect. No depression or anxiety.  No memory loss.   PHYSICAL EXAM: Blood pressure 112/58, pulse 111, temperature 97.9 F (36.6 C), temperature source Oral, resp. rate 16, height 5' (1.524 m), weight 107.502 kg (237 lb), SpO2 95 %.  General appearance :Awake, alert, not in any distress. Speech Clear. Looks chronically ill HEENT: Atraumatic and Normocephalic, pupils equally reactive to light and accomodation Neck: supple, no JVD. No cervical lymphadenopathy.  Chest:Good air entry bilaterally, no added sounds  CVS: S1 S2 irregular, no murmurs.  Abdomen: Bowel sounds present, Non tender and not distended with no gaurding, rigidity or rebound. Abdomen is obese Extremities: B/L Lower Ext shows chronic lymphedema, both lower extremities are warm to touch.  Neurology:  Non focal-but with generalized weakness Skin:No Rash Wounds:N/A  LABS ON ADMISSION:   Recent Labs  07/10/14 1321  NA 138  K 3.3*  CO2 20*  GLUCOSE 140  BUN  34.1*  CREATININE 2.2*  CALCIUM 8.8    Recent Labs  07/10/14 1321  AST 12  ALT <6  ALKPHOS 73  BILITOT 1.21*  PROT 6.8  ALBUMIN 3.2*   No results for input(s): LIPASE, AMYLASE in the last 72 hours.  Recent Labs  07/10/14 1321  WBC 2.2*  NEUTROABS 1.4*  HGB 9.0*  HCT 26.8*  MCV 93.9  PLT 148   No results for input(s): CKTOTAL, CKMB, CKMBINDEX, TROPONINI in the last 72 hours. No results for input(s): DDIMER in the last 72 hours. Invalid input(s): POCBNP   RADIOLOGIC STUDIES ON ADMISSION: No results found.  ASSESSMENT AND PLAN: Present on Admission:  . Vomiting and Diarrhea: Suspected acute gastroenteritis given she is immunocompromised, could be secondary to chemotherapeutic agents as well. Afebrile, slightly neutropenic but otherwise looks well. We will get stool studies including C. difficile PCR and GI pathogen panel. For now we will keep NPO, hydrate and monitor her off antibiotics.  Check  abdominal x-ray to make sure she does not have SBO-although doubt.   . Colon cancer- pT4a, pN2b, MX, ascending colon s/p lap assisted right colectomy 07/20/13: She is status post 4 cycles of FOLFOX (last on 6/16). this was complicated by worsening anemia for which she required 2 units of PRBC on 6/16. Hemoglobin currently appears stable. Oncology following.   . Atrial fibrillation: Chronic issue, she was previously on anticoagulation but was discontinued following GI bleed. Continue metoprolol. Check 12-lead EKG.   . Anemia: Multifactorial from anemia related to cancer and underlying chronic kidney disease. No overt evidence of bleeding at this time. Follow.   . Chronic kidney disease (CKD), stage III (moderate): Creatinine close to usual baseline. Follow.  .Right hydronephrosis on the CT 04/05/2014:She was referred to urology and underwent placement of a ureter stent on 04/27/2014. Per family, she is due for a stent replacement on 7/22.  Further plan will depend as patient's clinical course evolves and further radiologic and laboratory data become available. Patient will be monitored closely.  Above noted plan was discussed with patient/family face to face at bedside, they were in agreement.   CONSULTS: Oncology  DVT Prophylaxis: Prophylactic Lovenox  Code Status: Full Code   Disposition Plan:  Discharge back home possibly in 2-3 days,but may warrant SNF depending on clinical course  Total time spent  55 minutes.Greater than 50% of this time was spent in counseling, explanation of diagnosis, planning of further management, and coordination of care.  [facility] Pager [phone]  If 7PM-7AM, please contact night-coverage [email or web address] Password [facility] 07/10/2014, 3:25 PM

## 2014-07-10 NOTE — Telephone Encounter (Signed)
Labs/flush/ov added per 06/28 POF, pt was scheduled to see CB but she is over booked so per MD's desk nurse GBS will be seeing pt instead.... Pt is in the office

## 2014-07-10 NOTE — Telephone Encounter (Signed)
Called pt to check on her: "I feel terrible this morning". Asked pt if she's taken any imodium: "No, only the nausea stuff". Encouraged pt to increase fluid intake, avoid spicy/greasy foods for now, and instructed on taking Imodium after each loose stool up to 8 tablets per day. Offered to have [facility] see pt, pt declined stating, "I don't have a way, but I will try to come". Instructed pt to call us if she is willing to come so that we can make an appointment. Pt voiced understanding.

## 2014-07-10 NOTE — Progress Notes (Signed)
1335 Report called to [place] on 3W. Pt sent to admitting with daughters and NT

## 2014-07-10 NOTE — Progress Notes (Signed)
[place] OFFICE PROGRESS NOTE   Diagnosis: Colon cancer  INTERVAL HISTORY:   Ms. Natalie Chapman presents for an unscheduled visit. She completed cycle 4 FOLFOX on 06/28/2014. She reports no mouth sores or nausea/vomiting immediately following chemotherapy. No neuropathy symptoms. She received red cell transfusions on 06/28/2014 and 06/29/2014. She reports an improved energy level after the transfusions.  Approximately one week after the last cycle of chemotherapy she noted the onset of intermittent diarrhea and nausea/vomiting. She has been lethargic and weak for the past few days. She continues to have nausea. She also reports abdominal pain. No fever.  She telephone the office yesterday with a report of nausea and diarrhea. The nausea is persistent, no diarrhea today. She presented to the office for an unscheduled visit.  Objective:  Vital signs in last 24 hours:  Blood pressure 122/48, pulse 119, temperature 98.4 F (36.9 C), temperature source Oral, resp. rate 16, SpO2 98 %.    HEENT: No thrush or ulcers, the mucous membranes are dry Resp: Lungs clear bilaterally Cardio: Irregular, tachycardia GI: Soft, nontender, firm masslike fullness surrounding the right lower quadrant scar Vascular: Chronic stasis change with 1+ edema at the low leg and foot bilaterally Neuro: Lethargic, arousable, follows commands, she can stand with assistance  Skin: Linear ulcer at the upper gluteal fold   Portacath/PICC-without erythema  Lab Results:  Lab Results  Component Value Date   WBC 2.2* 07/10/2014   HGB 9.0* 07/10/2014   HCT 26.8* 07/10/2014   MCV 93.9 07/10/2014   PLT 148 07/10/2014   NEUTROABS 1.4* 07/10/2014    Chemistry panel pending  Lab Results  Component Value Date   CEA 24.9* 06/28/2014    Medications: I have reviewed the patient's current medications.  Assessment/Plan: 1. Stage IIIc (T4 N2) moderately differentiated adenocarcinoma of the right colon, status  post a laparoscopic assisted right colectomy 07/20/2013. 8 of 15 lymph nodes contained metastatic carcinoma, tumor deposits were present; microsatellite stable.  PET scan 08/24/2013 with mildly hypermetabolic small left axillary and right inguinal nodes--most likely unrelated to colon cancer, no clear evidence of metastatic disease.   Cycle 1 adjuvant Xeloda 09/05/2013  Cycle 2 adjuvant Xeloda 09/26/2013  Cycle 3 adjuvant Xeloda 10/17/2013  Cycle 4 adjuvant Xeloda 11/07/2013  Cycle 5 adjuvant Xeloda 11/28/2013  Cycle 6 adjuvant Xeloda 12/19/2013  Cycle 7 adjuvant Xeloda 01/09/2014  Cycle 8 adjuvant Xeloda 01/30/2014  Status post biopsy of nodularity at the right lower quadrant scar on 03/27/2014 confirming metastatic colon cancer  CT 04/05/2014 with new right hydronephrosis, soft tissue thickening adjacent to the ileocolonic anastomosis and nodularity in the overlying subcutaneous tissue  PET scan 05/02/2014 with hypermetabolic soft tissue adjacent to the ileocolic anastomosis beneath the right abdominal wall. Suspected soft tissue metastasis in the right abdominal wall, peritoneal disease along the right liver and pelvic nodal metastases. Suspected right pelvic implant. Associated mild right hydronephrosis with indwelling ureteral stent.  Cycle 1 FOLFOX 05/17/2014  Cycle 2 FOLFOX 05/31/2014  Cycle 3 FOLFOX 06/14/2014  Cycle 4 FOLFOX 06/28/2014 2. Atrial fibrillation 3. Anemia secondary to bleeding from the colon cancer versus renal insufficiency, and chemotherapy-status post 2 units of packed red blood cells beginning 06/28/2014 4. History of tobacco use with COPD changes on the CT 04/07/2013  5. Morbid obesity  6. Status post gastric banding surgery  7. Renal insufficiency  8. Anorexia-etiology unclear. Improved 9. Right hydronephrosis on the CT 04/05/2014. She was referred to urology and underwent placement of a ureter stent on 04/27/2014.  Renal function has  improved. 10. Port-A-Cath placement 05/15/2014 11. Nausea/vomiting, diarrhea, and abdominal pain-her symptom complex may be related to toxicity from chemotherapy, and infection such as C. difficile or urinary tract infection. Her symptoms could also be related to progressive carcinomatosis.    Disposition:  Natalie Chapman appears ill today. She appears dehydrated. She will be admitted for further evaluation and supportive care. I will contact the hospitalist service to arrange for an admission to [facility].  Recommendations: 1. Check urinalysis 2. Intravenous hydration and anti-emetics 3. Stool for the C. difficile toxin if the diarrhea persists 4. Acute abdominal x-ray series 5. Skin care for the decubitus ulcer  I appreciate the care from the internal medicine service. I will check on the Natalie Chapman 07/11/2014.    Betsy Coder, MD  07/10/2014  1:29 PM

## 2014-07-10 NOTE — Progress Notes (Signed)
Pt. Showed up @ [facility] to be 'worked in' with Dr. Benay Spice or someone.  Pt needed max assist (4 people) to get her out of the car. Very pale and weak. She has spoken to [place], RN earlier this am regarding diarrhea and weakness. Dr. Benay Spice and Ubaldo Glassing, RN made aware of pt's arrival.

## 2014-07-11 DIAGNOSIS — L8990 Pressure ulcer of unspecified site, unspecified stage: Secondary | ICD-10-CM | POA: Diagnosis present

## 2014-07-11 DIAGNOSIS — R197 Diarrhea, unspecified: Secondary | ICD-10-CM

## 2014-07-11 DIAGNOSIS — E860 Dehydration: Secondary | ICD-10-CM

## 2014-07-11 DIAGNOSIS — R1111 Vomiting without nausea: Secondary | ICD-10-CM

## 2014-07-11 LAB — BASIC METABOLIC PANEL
Anion gap: 8 (ref 5–15)
BUN: 41 mg/dL — ABNORMAL HIGH (ref 6–20)
CO2: 21 mmol/L — ABNORMAL LOW (ref 22–32)
Calcium: 7.8 mg/dL — ABNORMAL LOW (ref 8.9–10.3)
Chloride: 110 mmol/L (ref 101–111)
Creatinine, Ser: 2.26 mg/dL — ABNORMAL HIGH (ref 0.44–1.00)
GFR calc Af Amer: 23 mL/min — ABNORMAL LOW (ref >60)
GFR, EST NON AFRICAN AMERICAN: 20 mL/min — AB (ref >60)
GLUCOSE: 123 mg/dL — AB (ref 65–99)
POTASSIUM: 3.6 mmol/L (ref 3.5–5.1)
Sodium: 139 mmol/L (ref 135–145)

## 2014-07-11 LAB — CBC
HCT: 20.7 % — ABNORMAL LOW (ref 36.0–46.0)
Hemoglobin: 7 g/dL — ABNORMAL LOW (ref 12.0–15.0)
MCH: 31.7 pg (ref 26.0–34.0)
MCHC: 33.8 g/dL (ref 30.0–36.0)
MCV: 93.7 fL (ref 78.0–100.0)
PLATELETS: 120 10*3/uL — AB (ref 150–400)
RBC: 2.21 MIL/uL — AB (ref 3.87–5.11)
RDW: 18.8 % — ABNORMAL HIGH (ref 11.5–15.5)
WBC: 1.7 10*3/uL — AB (ref 4.0–10.5)

## 2014-07-11 LAB — CLOSTRIDIUM DIFFICILE BY PCR: Toxigenic C. Difficile by PCR: NEGATIVE

## 2014-07-11 LAB — DIFFERENTIAL
BASOS ABS: 0 10*3/uL (ref 0.0–0.1)
BASOS PCT: 0 % (ref 0–1)
EOS ABS: 0 10*3/uL (ref 0.0–0.7)
Eosinophils Relative: 1 % (ref 0–5)
LYMPHS PCT: 17 % (ref 12–46)
Lymphs Abs: 0.3 10*3/uL — ABNORMAL LOW (ref 0.7–4.0)
Monocytes Absolute: 0.5 10*3/uL (ref 0.1–1.0)
Monocytes Relative: 29 % — ABNORMAL HIGH (ref 3–12)
NEUTROS PCT: 53 % (ref 43–77)
Neutro Abs: 0.9 10*3/uL — ABNORMAL LOW (ref 1.7–7.7)

## 2014-07-11 MED ORDER — KCL IN DEXTROSE-NACL 20-5-0.45 MEQ/L-%-% IV SOLN
INTRAVENOUS | Status: AC
  Administered 2014-07-11 – 2014-07-15 (×7): via INTRAVENOUS
  Filled 2014-07-11 (×8): qty 1000

## 2014-07-11 MED ORDER — DIPHENOXYLATE-ATROPINE 2.5-0.025 MG PO TABS: 1.0000 | ORAL_TABLET | Freq: Four times a day (QID) | ORAL | Status: DC | PRN

## 2014-07-11 MED ORDER — ENOXAPARIN SODIUM 30 MG/0.3ML ~~LOC~~ SOLN
30.0000 mg | SUBCUTANEOUS | Status: DC
  Administered 2014-07-11 – 2014-07-16 (×6): 30 mg via SUBCUTANEOUS
  Filled 2014-07-11 (×7): qty 0.3

## 2014-07-11 MED ORDER — LOPERAMIDE HCL 2 MG PO CAPS
2.0000 mg | ORAL_CAPSULE | ORAL | Status: DC | PRN
  Administered 2014-07-11: 2 mg via ORAL
  Filled 2014-07-11: qty 1

## 2014-07-11 MED ORDER — METOPROLOL SUCCINATE 12.5 MG HALF TABLET
12.5000 mg | ORAL_TABLET | Freq: Every morning | ORAL | Status: DC
  Administered 2014-07-12: 12.5 mg via ORAL
  Filled 2014-07-11: qty 1

## 2014-07-11 NOTE — Progress Notes (Signed)
IP PROGRESS NOTE  Subjective:   She reports having less nausea. She continues to have frequent diarrhea.  Objective: Vital signs in last 24 hours: Blood pressure 95/49, pulse 87, temperature 97.5 F (36.4 C), temperature source Oral, resp. rate 20, height 5' (1.524 m), weight 237 lb (107.502 kg), SpO2 98 %.  Intake/Output from previous day: August 09, 2022 0701 - 06/29 0700 In: 1566.7 [I.V.:1566.7] Out: 3 [Urine:1; Stool:2]  Physical Exam:  HEENT: No thrush or ulcers Lungs: end inspiratory rales at the upper anterior chest, no respiratory distress Cardiac: Irregular Abdomen: Firm masslike fullness in the right lower abdomen surrounding the ostomy scar Extremities: Chronic stasis change at the lower leg bilaterally Neurologic: Alert, follows commands  Portacath/PICC-without erythema  Lab Results:  Recent Labs  August 09, 2014 1725 07/11/14 0455  WBC 2.1* 1.7*  HGB 8.1* 7.0*  HCT 23.7* 20.7*  PLT 131* 120*    BMET  Recent Labs  08-09-2014 1321 2014/08/09 1725 07/11/14 0455  NA 138  --  139  K 3.3*  --  3.6  CL  --   --  110  CO2 20*  --  21*  GLUCOSE 140  --  123*  BUN 34.1*  --  41*  CREATININE 2.2* 2.24* 2.26*  CALCIUM 8.8  --  7.8*    Studies/Results: Dg Abd 2 Views  August 09, 2014   CLINICAL DATA:  Nausea and vomiting and abdominal pain. Colon cancer.  EXAM: ABDOMEN - 2 VIEW  COMPARISON:  PET-CT dated 05/02/2014  FINDINGS: Right ureteral stent in place. No free air. There is air in the nondistended distal colon. The patient has had resection of the ascending colon. There is a prominent stool ball in the rectum. There is increased density in the right side of the abdomen which is created by the liver which is pushed inferiorly by the COPD.  There is increased density in the left lower quadrant which could represent distended stool filled colon.  Severe arthritis of the left hip. Diffuse degenerative changes in the lumbar spine. Multiple surgical clips in the left upper quadrant from  previous gastric surgery.  IMPRESSION: Prominent stool in the distal colon.  No free air.   Electronically Signed   By: Lorriane Shire M.D.   On: 2014/08/09 19:02    Medications: I have reviewed the patient's current medications.  Assessment/Plan:  1. 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 8 of 15 lymph nodes contained metastatic carcinoma, tumor deposits were present; microsatellite stable.  PET scan 08/24/2013 with mildly hypermetabolic small left axillary and right inguinal nodes--most likely unrelated to colon cancer, no clear evidence of metastatic disease.   Cycle 1 adjuvant Xeloda 09/05/2013  Cycle 2 adjuvant Xeloda 09/26/2013  Cycle 3 adjuvant Xeloda 10/17/2013  Cycle 4 adjuvant Xeloda 11/07/2013  Cycle 5 adjuvant Xeloda 11/28/2013  Cycle 6 adjuvant Xeloda 12/19/2013  Cycle 7 adjuvant Xeloda 01/09/2014  Cycle 8 adjuvant Xeloda 01/30/2014  Status post biopsy of nodularity at the right lower quadrant scar on 03/27/2014 confirming metastatic colon cancer  CT 04/05/2014 with new right hydronephrosis, soft tissue thickening adjacent to the ileocolonic anastomosis and nodularity in the overlying subcutaneous tissue  PET scan 05/02/2014 with hypermetabolic soft tissue adjacent to the ileocolic anastomosis beneath the right abdominal wall. Suspected soft tissue metastasis in the right abdominal wall, peritoneal disease along the right liver and pelvic nodal metastases. Suspected right pelvic implant. Associated mild right hydronephrosis with indwelling ureteral stent.  Cycle  1 FOLFOX 05/17/2014  Cycle 2 FOLFOX 05/31/2014  Cycle 3 FOLFOX 06/14/2014  Cycle 4 FOLFOX 06/28/2014 2. Atrial fibrillation 3. Anemia secondary to bleeding from the colon cancer,renal insufficiency, and chemotherapy-status post 2 units of packed red blood cells beginning 06/28/2014 4. History of  tobacco use with COPD changes on the CT 04/07/2013  5. Morbid obesity  6. Status post gastric banding surgery  7. Renal insufficiency  8. Anorexia-etiology unclear. Improved 9. Right hydronephrosis on the CT 04/05/2014. She was referred to urology and underwent placement of a ureter stent on 04/27/2014. Renal function has improved. 10. Port-A-Cath placement 05/15/2014 11. Nausea/vomiting, diarrhea, and dehydration-the stool C. difficile toxin is negative. Her presenting symptoms are most likely related to chemotherapy.  Recommendations: 1. Continue intravenous hydration 2. Check CBC 07/12/2014 and transfuse packed red blood cells at the hemoglobin is lower 3. Imodium and Lomotil for diarrhea    LOS: 1 day   Bridgit Eynon  07/11/2014, 7:20 AM

## 2014-07-11 NOTE — Evaluation (Signed)
Physical Therapy Evaluation Patient Details Name: Natalie Chapman MRN: [id number] DOB: 29-Sep-1940 Today's Date: 07/11/2014   History of Present Illness  74 yo female admitted with diarrhea. Hx of colon cancer, HTn, anemia, partial colectomy 2015, A fib, arthritis.   Clinical Impression  On eval, pt was Min assist for mobility-able to ambulate ~15'x2 with RW. Tolerated short distance fairly well but fatigues easily. Pt is weaker than baseline. Discussed d/c plan-pt states she will return home. She is hesitant to agree to HHPT but I think it will be beneficial if she will agree.     Follow Up Recommendations Home health PT;Supervision - Intermittent    Equipment Recommendations  None recommended by PT    Recommendations for Other Services       Precautions / Restrictions Precautions Precautions: Fall Restrictions Weight Bearing Restrictions: No      Mobility  Bed Mobility Overal bed mobility: Needs Assistance Bed Mobility: Supine to Sit     Supine to sit: Min assist;HOB elevated     General bed mobility comments: Assist for L LE off bed and scoot to EOB. Increased time. Utilized bedpad for scooting, positioning.   Transfers Overall transfer level: Needs assistance Equipment used: Rolling walker (2 wheeled)   Sit to Stand: Min assist         General transfer comment: Assist to rise, stabilize, control descent. VCs safety, technique,hand placement.   Ambulation/Gait Ambulation/Gait assistance: Min assist Ambulation Distance (Feet): 15 Feet (x2) Assistive device: Rolling walker (2 wheeled) Gait Pattern/deviations: Step-through pattern;Decreased stride length;Trunk flexed     General Gait Details: slow gait speed. fatigues fairly easily but tolerated short distance fairly well. Pt declined ambulation into hallway on today.   Stairs            Wheelchair Mobility    Modified Rankin (Stroke Patients Only)       Balance Overall balance assessment: Needs  assistance;History of Falls         Standing balance support: Bilateral upper extremity supported;During functional activity Standing balance-Leahy Scale: Poor Standing balance comment: needs RW                             Pertinent Vitals/Pain Pain Assessment: Faces Faces Pain Scale: Hurts whole lot Pain Location: L hip-during hygiene/rolling in bed with nursing assisting Pain Intervention(s): Repositioned    Home Living Family/patient expects to be discharged to:: Private residence Living Arrangements: Children Available Help at Discharge: Family Type of Home: House Home Access: Ramped entrance     Home Layout: One level Home Equipment: Environmental consultant - 2 wheels;Bedside commode;Shower seat;Wheelchair - manual      Prior Function Level of Independence: Needs assistance   Gait / Transfers Assistance Needed: uses walker household ambulation           Hand Dominance        Extremity/Trunk Assessment   Upper Extremity Assessment: Generalized weakness           Lower Extremity Assessment: Generalized weakness      Cervical / Trunk Assessment: Normal  Communication   Communication: No difficulties  Cognition Arousal/Alertness: Awake/alert Behavior During Therapy: WFL for tasks assessed/performed Overall Cognitive Status: Within Functional Limits for tasks assessed                      General Comments      Exercises        Assessment/Plan    PT  Assessment Patient needs continued PT services  PT Diagnosis Difficulty walking;Generalized weakness   PT Problem List Decreased strength;Decreased activity tolerance;Decreased balance;Decreased mobility;Obesity;Decreased knowledge of use of DME;Pain  PT Treatment Interventions DME instruction;Gait training;Functional mobility training;Therapeutic activities;Therapeutic exercise;Patient/family education;Balance training   PT Goals (Current goals can be found in the Care Plan section) Acute  Rehab PT Goals Patient Stated Goal: home once better PT Goal Formulation: With patient Time For Goal Achievement: 07/25/14 Potential to Achieve Goals: Fair    Frequency Min 3X/week   Barriers to discharge        Co-evaluation               End of Session   Activity Tolerance: Patient limited by fatigue Patient left: in chair;with call bell/phone within reach           Time: 1898-4210 PT Time Calculation (min) (ACUTE ONLY): 43 min   Charges:   PT Evaluation $Initial PT Evaluation Tier I: 1 Procedure PT Treatments $Gait Training: 8-22 mins   PT G Codes:        Weston Anna, MPT Pager: [phone]

## 2014-07-11 NOTE — Care Management (Signed)
Important Message  Patient Details  Name: Natalie Chapman MRN: [id number] Date of Birth: 01-29-1940   Medicare Important Message Given:  Yes-second notification given    Camillo Flaming, LCSW 07/11/2014, 11:05 AM

## 2014-07-11 NOTE — Care Management Note (Signed)
Case Management Note  Patient Details  Name: Natalie Chapman MRN: [id number] Date of Birth: 1940/10/06  Subjective/Objective:                 74 yo admitted with Diarrhea   Action/Plan: From home with child  Expected Discharge Date:                  Expected Discharge Plan:  Home/Self Care  In-House Referral:     Discharge planning Services  CM Consult  Post Acute Care Choice:    Choice offered to:     DME Arranged:    DME Agency:     HH Arranged:    HH Agency:     Status of Service:  In process, will continue to follow  Medicare Important Message Given:  Yes-second notification given Date Medicare IM Given:    Medicare IM give by:    Date Additional Medicare IM Given:    Additional Medicare Important Message give by:     If discussed at [place] of Stay Meetings, dates discussed:    Additional Comments: CM following for DC needs. Awaiting PT evaluation for assistance with disposition planning. Lynnell Catalan, RN 07/11/2014, 11:12 AM

## 2014-07-11 NOTE — Progress Notes (Signed)
[facility] PROGRESS NOTE  Natalie Chapman KVQ:[id number] DOB: January 05, 1941 DOA: 07/10/2014 PCP: Reymundo Poll, MD  Assessment/Plan: Principal Problem:   Diarrhea - C diff negative - agree with immodium  Active Problems:   Colon cancer- pT4a, pN2b, MX, ascending colon s/p lap assisted right colectomy 07/20/13 - oncology on board.    Atrial fibrillation - Given soft blood pressures will decrease beta blocker dose. Continue holding parameters for beta blocker    Anemia - agree with transfusing if hgb less than 7.0    Dehydration - Will place on MIVF's - most likely due to diarrhea    Chronic kidney disease (CKD), stage III (moderate)  - near baseline  Code Status: full Family Communication: no family at bedside  Disposition Plan: Pending improvement in condition   Consultants:  None  Procedures:  None  Antibiotics:  None   HPI/Subjective: Pt has no new complaints. Feels better  Objective: Filed Vitals:   07/11/14 0946  BP: 95/50  Pulse: 80  Temp:   Resp: 20    Intake/Output Summary (Last 24 hours) at 07/11/14 1620 Last data filed at 07/11/14 1400  Gross per 24 hour  Intake 1566.67 ml  Output      8 ml  Net 1558.67 ml   Filed Weights   07/10/14 1419  Weight: 107.502 kg (237 lb)    Exam:   General:  Pt in nad, alert and awake  Cardiovascular: no cyanosis  Respiratory: no increased wob, no wheezes  Abdomen: ND, no guarding  Musculoskeletal: equal tone BL   Data Reviewed: Basic Metabolic Panel:  Recent Labs Lab 07/10/14 1321 07/10/14 1725 07/11/14 0455  NA 138  --  139  K 3.3*  --  3.6  CL  --   --  110  CO2 20*  --  21*  GLUCOSE 140  --  123*  BUN 34.1*  --  41*  CREATININE 2.2* 2.24* 2.26*  CALCIUM 8.8  --  7.8*   Liver Function Tests:  Recent Labs Lab 07/10/14 1321  AST 12  ALT <6  ALKPHOS 73  BILITOT 1.21*  PROT 6.8  ALBUMIN 3.2*    Recent Labs Lab 07/10/14 1725  LIPASE 15*   No results for input(s):  AMMONIA in the last 168 hours. CBC:  Recent Labs Lab 07/10/14 1321 07/10/14 1725 07/11/14 0455  WBC 2.2* 2.1* 1.7*  NEUTROABS 1.4*  --  0.9*  HGB 9.0* 8.1* 7.0*  HCT 26.8* 23.7* 20.7*  MCV 93.9 93.7 93.7  PLT 148 131* 120*   Cardiac Enzymes: No results for input(s): CKTOTAL, CKMB, CKMBINDEX, TROPONINI in the last 168 hours. BNP (last 3 results) No results for input(s): BNP in the last 8760 hours.  ProBNP (last 3 results) No results for input(s): PROBNP in the last 8760 hours.  CBG: No results for input(s): GLUCAP in the last 168 hours.  Recent Results (from the past 240 hour(s))  Clostridium Difficile by PCR (not at [facility])     Status: None   Collection Time: 07/11/14  3:26 AM  Result Value Ref Range Status   C difficile by pcr NEGATIVE NEGATIVE Final     Studies: Dg Abd 2 Views  07/10/2014   CLINICAL DATA:  Nausea and vomiting and abdominal pain. Colon cancer.  EXAM: ABDOMEN - 2 VIEW  COMPARISON:  PET-CT dated 05/02/2014  FINDINGS: Right ureteral stent in place. No free air. There is air in the nondistended distal colon. The patient has had resection of the ascending colon.  There is a prominent stool ball in the rectum. There is increased density in the right side of the abdomen which is created by the liver which is pushed inferiorly by the COPD.  There is increased density in the left lower quadrant which could represent distended stool filled colon.  Severe arthritis of the left hip. Diffuse degenerative changes in the lumbar spine. Multiple surgical clips in the left upper quadrant from previous gastric surgery.  IMPRESSION: Prominent stool in the distal colon.  No free air.   Electronically Signed   By: Lorriane Shire M.D.   On: 07/10/2014 19:02    Scheduled Meds: . aspirin EC  81 mg Oral Daily  . atorvastatin  5 mg Oral q1800  . enoxaparin (LOVENOX) injection  30 mg Subcutaneous Q24H  . metoprolol succinate  25 mg Oral q morning - 10a   Continuous Infusions: .  dextrose 5 % and 0.45 % NaCl with KCl 20 mEq/L       Time spent: > 35 minutes    Velvet Bathe  [facility] Pager [phone]. If 7PM-7AM, please contact night-coverage at [email or web address], password [facility] 07/11/2014, 4:20 PM  LOS: 1 day

## 2014-07-12 ENCOUNTER — Inpatient Hospital Stay (HOSPITAL_COMMUNITY): Payer: Medicare Other

## 2014-07-12 ENCOUNTER — Other Ambulatory Visit: Payer: Medicare Other

## 2014-07-12 ENCOUNTER — Ambulatory Visit: Payer: Medicare Other

## 2014-07-12 ENCOUNTER — Ambulatory Visit: Payer: Medicare Other | Admitting: Nurse Practitioner

## 2014-07-12 DIAGNOSIS — K5669 Other intestinal obstruction: Secondary | ICD-10-CM

## 2014-07-12 DIAGNOSIS — D701 Agranulocytosis secondary to cancer chemotherapy: Secondary | ICD-10-CM

## 2014-07-12 DIAGNOSIS — R110 Nausea: Secondary | ICD-10-CM

## 2014-07-12 LAB — CBC WITH DIFFERENTIAL/PLATELET
BASOS ABS: 0 10*3/uL (ref 0.0–0.1)
BASOS PCT: 0 % (ref 0–1)
EOS PCT: 1 % (ref 0–5)
Eosinophils Absolute: 0 10*3/uL (ref 0.0–0.7)
HCT: 22.6 % — ABNORMAL LOW (ref 36.0–46.0)
HEMOGLOBIN: 7.7 g/dL — AB (ref 12.0–15.0)
Lymphocytes Relative: 19 % (ref 12–46)
Lymphs Abs: 0.3 10*3/uL — ABNORMAL LOW (ref 0.7–4.0)
MCH: 31.8 pg (ref 26.0–34.0)
MCHC: 34.1 g/dL (ref 30.0–36.0)
MCV: 93.4 fL (ref 78.0–100.0)
MONO ABS: 0.5 10*3/uL (ref 0.1–1.0)
MONOS PCT: 37 % — AB (ref 3–12)
NEUTROS ABS: 0.6 10*3/uL — AB (ref 1.7–7.7)
Neutrophils Relative %: 43 % (ref 43–77)
PLATELETS: 176 10*3/uL (ref 150–400)
RBC: 2.42 MIL/uL — AB (ref 3.87–5.11)
RDW: 19.5 % — AB (ref 11.5–15.5)
WBC: 1.4 10*3/uL — AB (ref 4.0–10.5)

## 2014-07-12 LAB — TYPE AND SCREEN
ABO/RH(D): A POS
Antibody Screen: NEGATIVE

## 2014-07-12 MED ORDER — LABETALOL HCL 5 MG/ML IV SOLN
5.0000 mg | Freq: Four times a day (QID) | INTRAVENOUS | Status: DC | PRN
  Filled 2014-07-12: qty 4

## 2014-07-12 MED ORDER — IOHEXOL 300 MG/ML  SOLN: 50.0000 mL | Freq: Once | INTRAMUSCULAR | Status: AC | PRN

## 2014-07-12 NOTE — Progress Notes (Signed)
[facility] PROGRESS NOTE  ANAIZ QAZI TWS:[id number] DOB: 30-Oct-1940 DOA: 07/10/2014 PCP: Reymundo Poll, MD  Brief narrative: Patient is a 74 year old Caucasian female with past medical history colon cancer undergoing chemotherapy last chemotherapy 6/16, chronic kidney disease stage III, history of right-sided hydronephrosis who underwent ureteral stent placement in 08/03/2014. Presented to the hospital complaining of vomiting and diarrhea. C. difficile negative. Last abd x ray was suspiscious for sbo. CT scan of abdomen pending.  Assessment/Plan: Principal Problem:   Diarrhea - C diff negative - agree with immodium - Continue supportive therapy  Nausea - concern is for SBO - will change oral medication to IV  - obtain CT scan of abdomen and pelvis - Will decrease opiod regimen and recommend patient be ambulated.  Active Problems:   Colon cancer- pT4a, pN2b, MX, ascending colon s/p lap assisted right colectomy 07/20/13 - oncology on board.    Atrial fibrillation - Given soft blood pressures will decrease beta blocker dose. Continue holding parameters for beta blocker    Anemia - agree with transfusing if hgb less than 7.0, on repeat hgb improved at 7.7    Dehydration - Will place on MIVF's - most likely due to diarrhea and nausea    Chronic kidney disease (CKD), stage III (moderate)  - near baseline  Code Status: full Family Communication: no family at bedside  Disposition Plan: Pending improvement in condition   Consultants:  None  Procedures:  None  Antibiotics:  None   HPI/Subjective: Pt's only complaint is nausea and emesis today.  Objective: Filed Vitals:   07/12/14 0500  BP: 113/56  Pulse: 86  Temp: 97.6 F (36.4 C)  Resp: 18    Intake/Output Summary (Last 24 hours) at 07/12/14 1226 Last data filed at 07/12/14 0600  Gross per 24 hour  Intake 3251.67 ml  Output    325 ml  Net 2926.67 ml   Filed Weights   07/10/14 1419  Weight:  107.502 kg (237 lb)    Exam:   General:  Pt in nad, alert and awake  Cardiovascular: no cyanosis  Respiratory: no increased wob, no wheezes  Abdomen: ND, no guarding  Musculoskeletal: equal tone BL   Data Reviewed: Basic Metabolic Panel:  Recent Labs Lab 07/10/14 1321 07/10/14 1725 07/11/14 0455  NA 138  --  139  K 3.3*  --  3.6  CL  --   --  110  CO2 20*  --  21*  GLUCOSE 140  --  123*  BUN 34.1*  --  41*  CREATININE 2.2* 2.24* 2.26*  CALCIUM 8.8  --  7.8*   Liver Function Tests:  Recent Labs Lab 07/10/14 1321  AST 12  ALT <6  ALKPHOS 73  BILITOT 1.21*  PROT 6.8  ALBUMIN 3.2*    Recent Labs Lab 07/10/14 1725  LIPASE 15*   No results for input(s): AMMONIA in the last 168 hours. CBC:  Recent Labs Lab 07/10/14 1321 07/10/14 1725 07/11/14 0455 07/12/14 0600  WBC 2.2* 2.1* 1.7* 1.4*  NEUTROABS 1.4*  --  0.9* 0.6*  HGB 9.0* 8.1* 7.0* 7.7*  HCT 26.8* 23.7* 20.7* 22.6*  MCV 93.9 93.7 93.7 93.4  PLT 148 131* 120* 176   Cardiac Enzymes: No results for input(s): CKTOTAL, CKMB, CKMBINDEX, TROPONINI in the last 168 hours. BNP (last 3 results) No results for input(s): BNP in the last 8760 hours.  ProBNP (last 3 results) No results for input(s): PROBNP in the last 8760 hours.  CBG: No results  for input(s): GLUCAP in the last 168 hours.  Recent Results (from the past 240 hour(s))  Clostridium Difficile by PCR (not at [facility])     Status: None   Collection Time: 07/11/14  3:26 AM  Result Value Ref Range Status   C difficile by pcr NEGATIVE NEGATIVE Final     Studies: Dg Abd 2 Views  07/12/2014   CLINICAL DATA:  Nausea and vomiting, metastatic colon cancer  EXAM: ABDOMEN - 2 VIEW  COMPARISON:  07/10/2014  FINDINGS: Left upper quadrant clips are noted. Right-sided Port-A-Cath in place with tip over the distal SVC. Patchy bibasilar airspace opacities are noted but not further characterized on this nondedicated exam. Several differential air-fluid  levels are identified over the left upper quadrant with probable mild small bowel dilatation over the left mid abdomen measuring 3.6 cm maximally. Severe left and mild right hip degenerative change. Moderate disc degenerative change in the lumbar spine. Right-sided nephro ureteral stent in place. No free air.  IMPRESSION: Moderately dilated left mid abdominal small bowel loops with differential air-fluid levels suggesting small bowel obstruction.   Electronically Signed   By: Conchita Paris M.D.   On: 07/12/2014 10:38   Dg Abd 2 Views  07/10/2014   CLINICAL DATA:  Nausea and vomiting and abdominal pain. Colon cancer.  EXAM: ABDOMEN - 2 VIEW  COMPARISON:  PET-CT dated 05/02/2014  FINDINGS: Right ureteral stent in place. No free air. There is air in the nondistended distal colon. The patient has had resection of the ascending colon. There is a prominent stool ball in the rectum. There is increased density in the right side of the abdomen which is created by the liver which is pushed inferiorly by the COPD.  There is increased density in the left lower quadrant which could represent distended stool filled colon.  Severe arthritis of the left hip. Diffuse degenerative changes in the lumbar spine. Multiple surgical clips in the left upper quadrant from previous gastric surgery.  IMPRESSION: Prominent stool in the distal colon.  No free air.   Electronically Signed   By: Lorriane Shire M.D.   On: 07/10/2014 19:02    Scheduled Meds: . aspirin EC  81 mg Oral Daily  . enoxaparin (LOVENOX) injection  30 mg Subcutaneous Q24H  . metoprolol succinate  12.5 mg Oral q morning - 10a   Continuous Infusions: . dextrose 5 % and 0.45 % NaCl with KCl 20 mEq/L 75 mL/hr at 07/12/14 0727    Time spent: > 35 minutes   Velvet Bathe  [facility] Pager [phone]. If 7PM-7AM, please contact night-coverage at [email or web address], password [facility] 07/12/2014, 12:26 PM  LOS: 2 days

## 2014-07-12 NOTE — Care Management Note (Signed)
Case Management Note  Patient Details  Name: Natalie Chapman MRN: [id number] Date of Birth: 1940-11-28  Subjective/Objective:        74 yo admitted with Diarrhea            Action/Plan: From home with daughter  Expected Discharge Date:                  Expected Discharge Plan:  [place]  In-House Referral:     Discharge planning Services  CM Consult  Post Acute Care Choice:  Home Health Choice offered to:  Patient  DME Arranged:    DME Agency:     HH Arranged:  RN, PT, Nurse's Aide [place] Agency:  [place]  Status of Service:  In process, will continue to follow  Medicare Important Message Given:  Yes-second notification given Date Medicare IM Given:    Medicare IM give by:    Date Additional Medicare IM Given:    Additional Medicare Important Message give by:     If discussed at [place] of Stay Meetings, dates discussed:    Additional Comments: PT is recommending HHPT at DC. Pt offered choice and states she has used [facility] in the past and would like to use them again. Pt is requesting RN and aide as well. Will need MD orders for [facility] services. [facility] rep given referral. Pt states she has all the equipment she needs at home. CM will continue to follow. Lynnell Catalan, RN 07/12/2014, 11:57 AM

## 2014-07-12 NOTE — Progress Notes (Signed)
IP PROGRESS NOTE  Subjective:   The diarrhea has stopped, but she continues to have nausea. She is vomiting.  Objective: Vital signs in last 24 hours: Blood pressure 113/56, pulse 86, temperature 97.6 F (36.4 C), temperature source Oral, resp. rate 18, height 5' (1.524 m), weight 237 lb (107.502 kg), SpO2 100 %.  Intake/Output from previous day: 06/29 0701 - 06/30 0700 In: 3251.7 [I.V.:3251.7] Out: 325 [Urine:325]  Physical Exam:  HEENT: No thrush or ulcers Lungs: Bilaterally Cardiac: Irregular Abdomen: Firm masslike fullness in the right lower abdomen surrounding the ostomy scar Extremities: Chronic stasis change at the lower leg bilaterally Neurologic: Alert, follows commands  Portacath/PICC-without erythema  Lab Results:  Recent Labs  07/11/14 0455 07/12/14 0600  WBC 1.7* 1.4*  HGB 7.0* 7.7*  HCT 20.7* 22.6*  PLT 120* 176    BMET  Recent Labs  August 06, 2014 1321 06-Aug-2014 1725 07/11/14 0455  NA 138  --  139  K 3.3*  --  3.6  CL  --   --  110  CO2 20*  --  21*  GLUCOSE 140  --  123*  BUN 34.1*  --  41*  CREATININE 2.2* 2.24* 2.26*  CALCIUM 8.8  --  7.8*    Studies/Results: Dg Abd 2 Views  08-06-2014   CLINICAL DATA:  Nausea and vomiting and abdominal pain. Colon cancer.  EXAM: ABDOMEN - 2 VIEW  COMPARISON:  PET-CT dated 05/02/2014  FINDINGS: Right ureteral stent in place. No free air. There is air in the nondistended distal colon. The patient has had resection of the ascending colon. There is a prominent stool ball in the rectum. There is increased density in the right side of the abdomen which is created by the liver which is pushed inferiorly by the COPD.  There is increased density in the left lower quadrant which could represent distended stool filled colon.  Severe arthritis of the left hip. Diffuse degenerative changes in the lumbar spine. Multiple surgical clips in the left upper quadrant from previous gastric surgery.  IMPRESSION: Prominent stool in the  distal colon.  No free air.   Electronically Signed   By: Lorriane Shire M.D.   On: 06-Aug-2014 19:02    Medications: I have reviewed the patient's current medications.  Assessment/Plan:  1. 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 8 of 15 lymph nodes contained metastatic carcinoma, tumor deposits were present; microsatellite stable.  PET scan 08/24/2013 with mildly hypermetabolic small left axillary and right inguinal nodes--most likely unrelated to colon cancer, no clear evidence of metastatic disease.   Cycle 1 adjuvant Xeloda 09/05/2013  Cycle 2 adjuvant Xeloda 09/26/2013  Cycle 3 adjuvant Xeloda 10/17/2013  Cycle 4 adjuvant Xeloda 11/07/2013  Cycle 5 adjuvant Xeloda 11/28/2013  Cycle 6 adjuvant Xeloda 12/19/2013  Cycle 7 adjuvant Xeloda 01/09/2014  Cycle 8 adjuvant Xeloda 01/30/2014  Status post biopsy of nodularity at the right lower quadrant scar on 03/27/2014 confirming metastatic colon cancer  CT 04/05/2014 with new right hydronephrosis, soft tissue thickening adjacent to the ileocolonic anastomosis and nodularity in the overlying subcutaneous tissue  PET scan 05/02/2014 with hypermetabolic soft tissue adjacent to the ileocolic anastomosis beneath the right abdominal wall. Suspected soft tissue metastasis in the right abdominal wall, peritoneal disease along the right liver and pelvic nodal metastases. Suspected right pelvic implant. Associated mild right hydronephrosis with indwelling ureteral stent.  Cycle 1 FOLFOX 05/17/2014  Cycle 2 FOLFOX 05/31/2014  Cycle 3 FOLFOX 06/14/2014  Cycle 4 FOLFOX 06/28/2014 2. Atrial fibrillation 3. Anemia secondary to bleeding from the colon cancer,renal insufficiency, and chemotherapy-status post 2 units of packed red blood cells beginning 06/28/2014 4. History of tobacco use with COPD changes on the CT 04/07/2013  5. Morbid  obesity  6. Status post gastric banding surgery  7. Renal insufficiency  8. Anorexia-etiology unclear. Improved 9. Right hydronephrosis on the CT 04/05/2014. She was referred to urology and underwent placement of a ureter stent on 04/27/2014. Renal function has improved. 10. Port-A-Cath placement 05/15/2014 11. Nausea/vomiting, diarrhea, and dehydration-the stool C. difficile toxin is negative. Her presenting symptoms are most likely related to chemotherapy versus metastatic colon cancer. I am concerned the persistent nausea could be related to carcinomatosis or bowel obstruction. 12. Neutropenia secondary to chemotherapy  Recommendations: 1. Continue intravenous hydration 2. Repeat abdominal plain x-ray and consider a CT if concern for obstruction remains 3. Obtain cultures and begin broad-spectrum IV antibiotics for a fever     LOS: 2 days   Shabana Armentrout  07/12/2014, 9:02 AM

## 2014-07-12 NOTE — Care Management (Signed)
Important Message  Patient Details  Name: Natalie Chapman MRN: [id number] Date of Birth: 13-Feb-1940   Medicare Important Message Given:  Yes-third notification given    [place], [place], LCSW 07/12/2014, 2:49 PM

## 2014-07-12 NOTE — Progress Notes (Signed)
CRITICAL VALUE ALERT  Critical value received: WBC 1.4  Date of notification:  07/12/14  Time of notification:  0627  Critical value read back:Yes.    Nurse who received alert:  Noreene Larsson RN, BSN  MD notified (1st page):  Chaney Malling NP  Time of first page:  [phone]

## 2014-07-13 ENCOUNTER — Encounter (HOSPITAL_COMMUNITY): Payer: Self-pay | Admitting: Internal Medicine

## 2014-07-13 DIAGNOSIS — T451X5A Adverse effect of antineoplastic and immunosuppressive drugs, initial encounter: Secondary | ICD-10-CM

## 2014-07-13 DIAGNOSIS — D61810 Antineoplastic chemotherapy induced pancytopenia: Secondary | ICD-10-CM | POA: Diagnosis present

## 2014-07-13 DIAGNOSIS — R112 Nausea with vomiting, unspecified: Secondary | ICD-10-CM

## 2014-07-13 DIAGNOSIS — L8990 Pressure ulcer of unspecified site, unspecified stage: Secondary | ICD-10-CM

## 2014-07-13 DIAGNOSIS — D709 Neutropenia, unspecified: Secondary | ICD-10-CM | POA: Diagnosis present

## 2014-07-13 DIAGNOSIS — I4891 Unspecified atrial fibrillation: Secondary | ICD-10-CM | POA: Diagnosis present

## 2014-07-13 DIAGNOSIS — K56609 Unspecified intestinal obstruction, unspecified as to partial versus complete obstruction: Secondary | ICD-10-CM | POA: Diagnosis present

## 2014-07-13 DIAGNOSIS — R17 Unspecified jaundice: Secondary | ICD-10-CM | POA: Diagnosis present

## 2014-07-13 DIAGNOSIS — N1330 Unspecified hydronephrosis: Secondary | ICD-10-CM | POA: Diagnosis present

## 2014-07-13 LAB — GI PATHOGEN PANEL BY PCR, STOOL
C difficile toxin A/B: NOT DETECTED
Campylobacter by PCR: NOT DETECTED
Cryptosporidium by PCR: NOT DETECTED
E coli (ETEC) LT/ST: NOT DETECTED
E coli (STEC): NOT DETECTED
E coli 0157 by PCR: NOT DETECTED
G lamblia by PCR: NOT DETECTED
Norovirus GI/GII: NOT DETECTED
Rotavirus A by PCR: NOT DETECTED
Salmonella by PCR: NOT DETECTED
Shigella by PCR: NOT DETECTED

## 2014-07-13 LAB — LACTIC ACID, PLASMA: Lactic Acid, Venous: 1 mmol/L (ref 0.5–2.0)

## 2014-07-13 MED ORDER — PROMETHAZINE HCL 25 MG/ML IJ SOLN
12.5000 mg | Freq: Four times a day (QID) | INTRAMUSCULAR | Status: DC | PRN
  Administered 2014-07-13 – 2014-07-15 (×2): 12.5 mg via INTRAVENOUS
  Filled 2014-07-13 (×2): qty 1

## 2014-07-13 MED ORDER — METOPROLOL TARTRATE 1 MG/ML IV SOLN
2.5000 mg | Freq: Four times a day (QID) | INTRAVENOUS | Status: DC
  Administered 2014-07-13 – 2014-07-18 (×16): 2.5 mg via INTRAVENOUS
  Filled 2014-07-13 (×24): qty 5

## 2014-07-13 NOTE — Progress Notes (Signed)
Patient with skin breakdown under abdominal fold on the left side--skin weeping, red, open, tender 7-8 cm long area. Applied neosporin and laid pillow case in fold to weep away moisture.

## 2014-07-13 NOTE — Progress Notes (Signed)
IP PROGRESS NOTE  Subjective:   She reports feeling much better today. She no longer feels nausea. She had a large volume of stool output after the CT contrast yesterday.  Objective: Vital signs in last 24 hours: Blood pressure 111/59, pulse 126, temperature 97.8 F (36.6 C), temperature source Oral, resp. rate 18, height 5' (1.524 m), weight 237 lb (107.502 kg), SpO2 98 %.  Intake/Output from previous day: 06/30 0701 - 07/01 0700 In: 622.5 [I.V.:622.5] Out: 250 [Urine:250]  Physical Exam:  HEENT: No thrush or ulcers  Abdomen: Firm masslike fullness in the right lower abdomen surrounding the ostomy scar Extremities: Chronic stasis change at the lower leg bilaterally Neurologic: Alert, follows commands  Portacath/PICC-without erythema  Lab Results:  Recent Labs  07/11/14 0455 07/12/14 0600  WBC 1.7* 1.4*  HGB 7.0* 7.7*  HCT 20.7* 22.6*  PLT 120* 176    BMET  Recent Labs  07/10/14 1321 07/10/14 1725 07/11/14 0455  NA 138  --  139  K 3.3*  --  3.6  CL  --   --  110  CO2 20*  --  21*  GLUCOSE 140  --  123*  BUN 34.1*  --  41*  CREATININE 2.2* 2.24* 2.26*  CALCIUM 8.8  --  7.8*    Studies/Results: Ct Abdomen Pelvis Wo Contrast  07/12/2014   CLINICAL DATA:  Acute generalized abdominal pain.  EXAM: CT ABDOMEN AND PELVIS WITHOUT CONTRAST  TECHNIQUE: Multidetector CT imaging of the abdomen and pelvis was performed following the standard protocol without IV contrast.  COMPARISON:  CT scan of April 05, 2014.  FINDINGS: Severe multilevel degenerative disc disease is noted in the lumbar spine. Minimal right pleural effusion is noted. Right middle lobe airspace opacity is noted concerning for pneumonia.  Status post cholecystectomy. Mild splenomegaly is noted. Visualized portion of pancreas appears normal. Atherosclerosis of abdominal aorta is noted without aneurysm formation. Left adrenal gland is not well visualized. Right adrenal gland appears normal. Moderate right  hydronephrosis is noted. Ureteral stent is seen extending from inferior portion of right renal pelvis to urinary bladder. Prominent left extrarenal pelvis is noted without ureteral dilatation. There is interval development of small bowel dilatation concerning for distal obstruction. Status post right colectomy. Stable soft tissue density is seen in surgical site in right lower quadrant of anterior abdominal wall, which appears to extend into the peritoneal space in the right lower quadrant. No abnormal fluid collection is noted. Urinary bladder is decompressed. Status post hysterectomy.  IMPRESSION: Right middle lobe airspace opacity is noted concerning for pneumonia.  Mild splenomegaly is noted.  Status post cholecystectomy, hysterectomy and right colectomy. Stable soft tissue density is seen in the region of surgical site in the right lower quadrant of the anterior abdominal wall which extends into peritoneal space in the right lower quadrant around presumed position of ileocolic anastomosis. While this may simply represent postoperative scarring, recurrent or metastatic malignancy cannot be excluded.  Interval development of small bowel dilatation concerning for distal small bowel obstruction.  Interval placement of right-sided ureteral stent seen extending from right renal pelvis to urinary bladder.   Electronically Signed   By: Marijo Conception, M.D.   On: 07/12/2014 17:37   Dg Abd 2 Views  07/12/2014   CLINICAL DATA:  Nausea and vomiting, metastatic colon cancer  EXAM: ABDOMEN - 2 VIEW  COMPARISON:  07/10/2014  FINDINGS: Left upper quadrant clips are noted. Right-sided Port-A-Cath in place with tip over the distal SVC. Patchy bibasilar  airspace opacities are noted but not further characterized on this nondedicated exam. Several differential air-fluid levels are identified over the left upper quadrant with probable mild small bowel dilatation over the left mid abdomen measuring 3.6 cm maximally. Severe left  and mild right hip degenerative change. Moderate disc degenerative change in the lumbar spine. Right-sided nephro ureteral stent in place. No free air.  IMPRESSION: Moderately dilated left mid abdominal small bowel loops with differential air-fluid levels suggesting small bowel obstruction.   Electronically Signed   By: Conchita Paris M.D.   On: 07/12/2014 10:38    Medications: I have reviewed the patient's current medications.  Assessment/Plan:  1. 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 8 of 15 lymph nodes contained metastatic carcinoma, tumor deposits were present; microsatellite stable.  PET scan 08/24/2013 with mildly hypermetabolic small left axillary and right inguinal nodes--most likely unrelated to colon cancer, no clear evidence of metastatic disease.   Cycle 1 adjuvant Xeloda 09/05/2013  Cycle 2 adjuvant Xeloda 09/26/2013  Cycle 3 adjuvant Xeloda 10/17/2013  Cycle 4 adjuvant Xeloda 11/07/2013  Cycle 5 adjuvant Xeloda 11/28/2013  Cycle 6 adjuvant Xeloda 12/19/2013  Cycle 7 adjuvant Xeloda 01/09/2014  Cycle 8 adjuvant Xeloda 01/30/2014  Status post biopsy of nodularity at the right lower quadrant scar on 03/27/2014 confirming metastatic colon cancer  CT 04/05/2014 with new right hydronephrosis, soft tissue thickening adjacent to the ileocolonic anastomosis and nodularity in the overlying subcutaneous tissue  PET scan 05/02/2014 with hypermetabolic soft tissue adjacent to the ileocolic anastomosis beneath the right abdominal wall. Suspected soft tissue metastasis in the right abdominal wall, peritoneal disease along the right liver and pelvic nodal metastases. Suspected right pelvic implant. Associated mild right hydronephrosis with indwelling ureteral stent.  Cycle 1 FOLFOX 05/17/2014  Cycle 2 FOLFOX 05/31/2014  Cycle 3 FOLFOX 06/14/2014  Cycle 4 FOLFOX  06/28/2014 2. Atrial fibrillation 3. Anemia secondary to bleeding from the colon cancer,renal insufficiency, and chemotherapy-status post 2 units of packed red blood cells beginning 06/28/2014 4. History of tobacco use with COPD changes on the CT 04/07/2013  5. Morbid obesity  6. Status post gastric banding surgery  7. Renal insufficiency  8. Anorexia-etiology unclear. Improved 9. Right hydronephrosis on the CT 04/05/2014. She was referred to urology and underwent placement of a ureter stent on 04/27/2014. Renal function has improved. 10. Port-A-Cath placement 05/15/2014 11. Nausea/vomiting, diarrhea, and dehydration-the plain x-ray and CT on 07/12/2014 is consistent with a partial small bowel obstruction  12. Neutropenia secondary to chemotherapy   Her clinical status appears improved after correction of the dehydration. The CT yesterday is consistent with a partial small bowel obstruction. I discussed the CT findings with Natalie Chapman and her family. I explained the obstruction is probably related to cancer. She understands an attempt at surgical intervention will most likely not be recommended. We discussed a palliative gastrostomy tube.  We will decide on continuing chemotherapy based on recovery from the partial small bowel obstruction.  Recommendations: 1. Consult surgery for recommendations regarding the bowel obstruction 2. Repeat CBC 07/14/2014 3. Obtain cultures and begin broad-spectrum IV antibiotics for a fever 4. I will be out until 07/16/2014. Please call oncology as needed     LOS: 3 days   Natalie Chapman  07/13/2014, 8:47 AM

## 2014-07-13 NOTE — Progress Notes (Signed)
I spoke with her and her family.  If she can keep liquids done, I would let her go home.  If not then will discussed palliative g-tube vs loop ileostomy.

## 2014-07-13 NOTE — Consult Note (Signed)
WOC wound consult note Reason for Consult: Small linear break in skin at the apex of buttock cleft. Intertriginous dermatitis (ITD) beneath left breast. Wound type:Moisture Associated Skin Damage, specifically incontinence associated skin damage (IAD) and intertriginous dermatitis Pressure Ulcer POA: No Measurement: apex of buttock cleft: 3cm x 0.2cm x 0.2cm Wound KBT:CYEL, moist Drainage (amount, consistency, odor) scant serous Periwound:Intact, moist, macerated, erythematous Dressing procedure/placement/frequency: Patient with history of loose stools and the area is consistent with moisture associated skin damage, specifically incontinence associated dermatitis (IAD) and intertriginous dermatitis (ITD).  Together with continuing the excellent skin care using our house skin care products, we will today add a therapeutic mattress replacement with low air loss feature and an antimicrobial textile (InterDry Ag+) for the inframammary area. [facility] nursing team will not follow, but will remain available to this patient, the nursing and medical teams.  Please re-consult if needed. Thanks, Maudie Flakes, MSN, RN, [place], [place], [place] [phone])

## 2014-07-13 NOTE — Consult Note (Signed)
Natalie Chapman 24-Apr-1940  [id number].   Primary Oncology MD: Dr. Benay Spice Requesting MD: Dr. Margreta Journey Rama Chief Complaint/Reason for Consult: pSBO HPI: This is a very nice 74 yo white female who underwent a lap assisted right colectomy 07-20-13 by Dr. Zella Richer for Stage III colon cancer.  She was noted to have what appeared to be scar tissue on a scan under her incision several months later after already started chemo.  This area was biopsied and proven to be recurrent cancer.  This is causing a dimpling of her skin at this old incision site.  Please see Dr. Gearldine Shown note for her history of chemo. She was on Xeloda, but is now receiving Folfox.  Her disease has apparently progressed despite her current regimen.    Last Thursday the patient developed some diarrhea.  She also got confused, which she occasionally does, apparently.  This persisted and she then developed nausea and vomiting start Tuesday of this week.  She also developed abdominal pain, which according to the family is intermittent anyway.  The patient does not eat well and after chemo tends to not eat for 4-5 days at a time because she just will constantly sleep.  She has lost a significant amount of weight.  Due to her symptoms, she was brought to the hospital.  She has a CT scan that reveals possible PNA, soft tissue density at her old surgical site in the RLQ that is a known recurrence as well as small bowel dilatation concerning for distal small bowel obstruction.  Since she has been here, her diarrhea is somewhat improved.  Her nausea is better since she has been NPO.  She has drank some coffee today.  We have been asked to evaluate the patient for further recommendations.  ROS : Please see HPI, otherwise negative currently.  Family History  Problem Relation Age of Onset  . Heart disease Mother   . Heart disease Father     Past Medical History  Diagnosis Date  . Hypertension   . Anemia   . H/O hiatal hernia   . Chronic  atrial fibrillation     CARDIOLOGIST-- DR Johnsie Cancel  . Arthritis     LEFT HIP  . Generalized weakness   . Colon cancer dx mar  2015    Stage IIIc, pT4a  pN2b, moderate differentiated adenocarcinoma right colon with 8of15 +lymph nodes for metastatic carcinoma--  s/p  right colectomy 07-20-2013;  chemotherapy 09-05-2013 to 01-30-2014  . 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 Poor appetite   . Frequency of urination   . Wears glasses   . Full dentures   . Dysrhythmia     AFIB  . Chronic kidney disease (CKD), stage I   . Hydronephrosis, right   . Seroma, postoperative 07/27/2013    Past Surgical History  Procedure Laterality Date  . Colonoscopy N/A 05/02/2013    Procedure: COLONOSCOPY;  Surgeon: Jeryl Columbia, MD;  Location: [facility];  Service: Endoscopy;  Laterality: N/A;  . Esophagogastroduodenoscopy endoscopy    . Incisional hernia repair  1980's  . Laparoscopic partial colectomy N/A 07/20/2013    Procedure: LAPAROSCOPIC  ASSISTED RIGHT COLECTOMY;  Surgeon: Odis Hollingshead, MD;  Location: [place];  Service: General;  Laterality: N/A;  . Gastric restriction surgery  [phone]   [facility]  . Transthoracic echocardiogram  06-20-2013   dr Johnsie Cancel    severe LAE/  ef 55-60%/  mild MR and PR/  moderate TR/  moderate AV calcification without stenosis  .  Cholecystectomy  1980's  . Tubal ligation  1973  . Tonsillectomy  as child  . Cystoscopy w/ ureteral stent placement Right 04/27/2014    Procedure: CYSTOSCOPY WITH RIGHT URETERAL STENT PLACEMENT;  Surgeon: Ardis Hughs, MD;  Location: [facility];  Service: Urology;  Laterality: Right;  . Portacath placement Left 05/15/2014    Procedure: INSERTION PORT-A-CATH WITH ULTRASOUND;  Surgeon: Jackolyn Confer, MD;  Location: [place];  Service: General;  Laterality: Left;  . Operative ultrasound Left 05/15/2014    Procedure: OPERATIVE ULTRASOUND;  Surgeon: Jackolyn Confer, MD;  Location: [place];  Service: General;  Laterality: Left;  . Hiatal hernia  repair      Social History:  reports that she quit smoking about 24 years ago. Her smoking use included Cigarettes. She has a 30 pack-year smoking history. She has never used smokeless tobacco. She reports that she does not drink alcohol or use illicit drugs.  Allergies: No Known Allergies  Medications Prior to Admission  Medication Sig Dispense Refill  . acetaminophen (TYLENOL) 650 MG CR tablet Take 1,300 mg by mouth every morning. Arthritis pain    . aspirin EC 81 MG tablet Take 81 mg by mouth daily.    Marland Kitchen atorvastatin (LIPITOR) 10 MG tablet Take 5 mg by mouth daily with breakfast.     . docusate sodium (COLACE) 100 MG capsule Take 100 mg by mouth daily as needed for mild constipation.    . lidocaine-prilocaine (EMLA) cream Apply 1 application topically as needed. APPLY TO PORTACATH 1-2 HOURS PRIOR TO USE 30 g 0  . metoprolol succinate (TOPROL-XL) 25 MG 24 hr tablet Take 25 mg by mouth every morning.     . Multiple Vitamins-Iron (MULTIVITAMIN/IRON PO) Take 1 tablet by mouth daily.    . polyethylene glycol (MIRALAX / GLYCOLAX) packet Take 17 g by mouth daily as needed for mild constipation.     . prochlorperazine (COMPAZINE) 5 MG tablet Take 1 tablet (5 mg total) by mouth every 6 (six) hours as needed for nausea or vomiting. 30 tablet 3    Blood pressure 111/59, pulse 108, temperature 97.8 F (36.6 C), temperature source Oral, resp. rate 18, height 5' (1.524 m), weight 107.502 kg (237 lb), SpO2 98 %. Physical Exam: General: pleasant white female who is sitting in her recliner in NAD HEENT: head is normocephalic, atraumatic.  Sclera are noninjected.  PERRL.  Ears and nose without any masses or lesions.  Mouth is pink and moist Heart: regular rate.  Normal s1,s2. No obvious murmurs, gallops, or rubs noted.  Palpable radial and pedal pulses bilaterally Lungs: CTAB, no wheezes, rhonchi, or rales noted.  Respiratory effort nonlabored Abd: soft, NT, ND, +BS, no hernias or organomegaly. Some  fullness of upper abdomen.  She has dimpling of her skin at her old RLQ incision site.  There is a hard mass palpable under this scar from known recurrence of colon cancer MS: all 4 extremities are symmetrical with no cyanosis, clubbing.  Chronic venous stasis changes noted to bilateral LE as well as pitting edema Skin: warm and dry with no masses, lesions, or rashes Psych: A&Ox3 with an appropriate affect.    Results for orders placed or performed during the hospital encounter of 07/10/14 (from the past 48 hour(s))  CBC with Differential     Status: Abnormal   Collection Time: 07/12/14  6:00 AM  Result Value Ref Range   WBC 1.4 (LL) 4.0 - 10.5 K/uL    Comment: REPEATED TO  VERIFY CRITICAL RESULT CALLED TO, READ BACK BY AND VERIFIED WITH: S. PADDEN RN AT 0630 ON 06.30.16 BY SHUEA    RBC 2.42 (L) 3.87 - 5.11 MIL/uL   Hemoglobin 7.7 (L) 12.0 - 15.0 g/dL   HCT 22.6 (L) 36.0 - 46.0 %   MCV 93.4 78.0 - 100.0 fL   MCH 31.8 26.0 - 34.0 pg   MCHC 34.1 30.0 - 36.0 g/dL   RDW 19.5 (H) 11.5 - 15.5 %   Platelets 176 150 - 400 K/uL   Neutrophils Relative % 43 43 - 77 %   Lymphocytes Relative 19 12 - 46 %   Monocytes Relative 37 (H) 3 - 12 %   Eosinophils Relative 1 0 - 5 %   Basophils Relative 0 0 - 1 %   Neutro Abs 0.6 (L) 1.7 - 7.7 K/uL   Lymphs Abs 0.3 (L) 0.7 - 4.0 K/uL   Monocytes Absolute 0.5 0.1 - 1.0 K/uL   Eosinophils Absolute 0.0 0.0 - 0.7 K/uL   Basophils Absolute 0.0 0.0 - 0.1 K/uL   WBC Morphology TOXIC GRANULATION   Type and screen     Status: None   Collection Time: 07/12/14  6:00 AM  Result Value Ref Range   ABO/RH(D) A POS    Antibody Screen NEG    Sample Expiration 07/15/2014   Lactic acid, plasma     Status: None   Collection Time: 07/13/14  6:40 AM  Result Value Ref Range   Lactic Acid, Venous 1.0 0.5 - 2.0 mmol/L   Ct Abdomen Pelvis Wo Contrast  07/12/2014   CLINICAL DATA:  Acute generalized abdominal pain.  EXAM: CT ABDOMEN AND PELVIS WITHOUT CONTRAST   TECHNIQUE: Multidetector CT imaging of the abdomen and pelvis was performed following the standard protocol without IV contrast.  COMPARISON:  CT scan of April 05, 2014.  FINDINGS: Severe multilevel degenerative disc disease is noted in the lumbar spine. Minimal right pleural effusion is noted. Right middle lobe airspace opacity is noted concerning for pneumonia.  Status post cholecystectomy. Mild splenomegaly is noted. Visualized portion of pancreas appears normal. Atherosclerosis of abdominal aorta is noted without aneurysm formation. Left adrenal gland is not well visualized. Right adrenal gland appears normal. Moderate right hydronephrosis is noted. Ureteral stent is seen extending from inferior portion of right renal pelvis to urinary bladder. Prominent left extrarenal pelvis is noted without ureteral dilatation. There is interval development of small bowel dilatation concerning for distal obstruction. Status post right colectomy. Stable soft tissue density is seen in surgical site in right lower quadrant of anterior abdominal wall, which appears to extend into the peritoneal space in the right lower quadrant. No abnormal fluid collection is noted. Urinary bladder is decompressed. Status post hysterectomy.  IMPRESSION: Right middle lobe airspace opacity is noted concerning for pneumonia.  Mild splenomegaly is noted.  Status post cholecystectomy, hysterectomy and right colectomy. Stable soft tissue density is seen in the region of surgical site in the right lower quadrant of the anterior abdominal wall which extends into peritoneal space in the right lower quadrant around presumed position of ileocolic anastomosis. While this may simply represent postoperative scarring, recurrent or metastatic malignancy cannot be excluded.  Interval development of small bowel dilatation concerning for distal small bowel obstruction.  Interval placement of right-sided ureteral stent seen extending from right renal pelvis to  urinary bladder.   Electronically Signed   By: Marijo Conception, M.D.   On: 07/12/2014 17:37   Dg Abd 2  Views  07/12/2014   CLINICAL DATA:  Nausea and vomiting, metastatic colon cancer  EXAM: ABDOMEN - 2 VIEW  COMPARISON:  07/10/2014  FINDINGS: Left upper quadrant clips are noted. Right-sided Port-A-Cath in place with tip over the distal SVC. Patchy bibasilar airspace opacities are noted but not further characterized on this nondedicated exam. Several differential air-fluid levels are identified over the left upper quadrant with probable mild small bowel dilatation over the left mid abdomen measuring 3.6 cm maximally. Severe left and mild right hip degenerative change. Moderate disc degenerative change in the lumbar spine. Right-sided nephro ureteral stent in place. No free air.  IMPRESSION: Moderately dilated left mid abdominal small bowel loops with differential air-fluid levels suggesting small bowel obstruction.   Electronically Signed   By: Conchita Paris M.D.   On: 07/12/2014 10:38       Assessment/Plan 1. 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 PSBO, N/V/ diarrhea -the patient is having some diarrhea, so she is not completely obstructed, but partially obstructed.  She likely has a somewhat high-grade partial obstruction.  Ideally, given she is malnourished and undergoing recent chemo, if she can resolve this on her own that would be good.  She may only be able to take in a full liquid or pureed diet at best.  If she does not improve with conservative management, she may require a palliative operation for a bypass, resection, and/or placement of a gastrostomy tube.  As stated earlier, given her malnutrition and recent chemo, the patient would be at high risk for surgical complications such as wound healing or healing of any type of anastomosis if created.  For now, she is on clear liquids and we will see how she  tolerates this.   3. Presumed malnutrition -will order prealbumin in the am 4. A fib -not on blood thinners 5. Chronic kidney disease 6. Right sided hydronephrosis with stent  7. Neutropenia   Saadiq Poche E 07/13/2014, 1:46 PM Pager: [phone]

## 2014-07-13 NOTE — Progress Notes (Signed)
Patient with partial thickness wound to crease between buttocks--"like a hard wipe took off skin in one streak"-applied allevyn dsg over area. Using moisture barrier cream to perineal area as well.

## 2014-07-13 NOTE — Progress Notes (Signed)
Progress Note   Natalie Chapman PYK:[id number] DOB: 05/30/1940 DOA: 07/10/2014 PCP: Reymundo Poll, MD   Brief Narrative:   Natalie Chapman is an 74 y.o. female with past medical history colon cancer undergoing chemotherapy last chemotherapy 6/16, chronic kidney disease stage III, history of right-sided hydronephrosis who underwent ureteral stent placement in 08/03/2014. Presented to the hospital complaining of vomiting and diarrhea. C. difficile negative. CT of the abdomen done 07/12/14 concerning for SBO.  Assessment/Plan:   Principal Problem:   Small bowel obstruction - Status post CT scan 07/12/14 showing findings concerning for distal small bowel obstruction. - Continue bowel rest and IV fluids. - Surgical consultation requested. - May need a palliative G-tube if nonsurgical care recommended.  Active Problems:   Colon cancer- pT4a, pN2b, MX, ascending colon s/p lap assisted right colectomy 07/20/13 - Oncologist following. CT scan findings concerning for possible recurrence versus scar tissue at operative site. - Last chemotherapy with FOLFOX given 06/28/14.    Atrial fibrillation with rapid ventricular response - Heart rate 86-126. - Metoprolol currently on hold given SBO. We'll start IV metoprolol 2.5 mg every 6 hours to address rate control. - Continue aspirin. - No anticoagulation secondary to history of GI bleeding.    Anti-neoplastic chemotherapy induced pancytopenia / neutropenia - Monitor counts. - Platelet count back to normal limits. - Remains neutropenic and anemic. - Transfuse PRBCs for hemoglobin less than 7. No current indication for transfusion. Received 2 units of PRBCs on 06/28/14.    Morbid obesity  - Status post gastric banding surgery.    Dehydration - Continue IV fluids.    Right hydronephrosis - Status post ureteral stent 04/27/14.    Elevated bilirubin - No mention of ductal dilatation on CT scan of abdomen done 07/12/14. Other LFTs WNL.    Right  middle lobe opacity noted on CT done 07/12/14 - No clinical evidence of pneumonia, but low threshold to start antibiotics if she becomes febrile.    Diarrhea - C. difficile studies negative. Follow-up GI pathogen panel which is still pending.    Chronic kidney disease (CKD), stage III (moderate) - Baseline creatinine 2.2, current creatinine consistent with usual baseline values.    Pressure ulcer - Wound care consultation requested.    DVT Prophylaxis - Lovenox.  Family Communication: Sister updated at the bedside. Disposition Plan: Home when stable. Code Status:     Code Status Orders        Start     Ordered   07/10/14 1558  Full code   Continuous     07/10/14 1557        IV Access:    Port-A-Cath   Procedures and diagnostic studies:   Ct Abdomen Pelvis Wo Contrast  07/12/2014   CLINICAL DATA:  Acute generalized abdominal pain.  EXAM: CT ABDOMEN AND PELVIS WITHOUT CONTRAST  TECHNIQUE: Multidetector CT imaging of the abdomen and pelvis was performed following the standard protocol without IV contrast.  COMPARISON:  CT scan of April 05, 2014.  FINDINGS: Severe multilevel degenerative disc disease is noted in the lumbar spine. Minimal right pleural effusion is noted. Right middle lobe airspace opacity is noted concerning for pneumonia.  Status post cholecystectomy. Mild splenomegaly is noted. Visualized portion of pancreas appears normal. Atherosclerosis of abdominal aorta is noted without aneurysm formation. Left adrenal gland is not well visualized. Right adrenal gland appears normal. Moderate right hydronephrosis is noted. Ureteral stent is seen extending from inferior portion of right renal pelvis to urinary  bladder. Prominent left extrarenal pelvis is noted without ureteral dilatation. There is interval development of small bowel dilatation concerning for distal obstruction. Status post right colectomy. Stable soft tissue density is seen in surgical site in right lower  quadrant of anterior abdominal wall, which appears to extend into the peritoneal space in the right lower quadrant. No abnormal fluid collection is noted. Urinary bladder is decompressed. Status post hysterectomy.  IMPRESSION: Right middle lobe airspace opacity is noted concerning for pneumonia.  Mild splenomegaly is noted.  Status post cholecystectomy, hysterectomy and right colectomy. Stable soft tissue density is seen in the region of surgical site in the right lower quadrant of the anterior abdominal wall which extends into peritoneal space in the right lower quadrant around presumed position of ileocolic anastomosis. While this may simply represent postoperative scarring, recurrent or metastatic malignancy cannot be excluded.  Interval development of small bowel dilatation concerning for distal small bowel obstruction.  Interval placement of right-sided ureteral stent seen extending from right renal pelvis to urinary bladder.   Electronically Signed   By: Marijo Conception, M.D.   On: 07/12/2014 17:37   Dg Abd 2 Views  07/12/2014   CLINICAL DATA:  Nausea and vomiting, metastatic colon cancer  EXAM: ABDOMEN - 2 VIEW  COMPARISON:  07/10/2014  FINDINGS: Left upper quadrant clips are noted. Right-sided Port-A-Cath in place with tip over the distal SVC. Patchy bibasilar airspace opacities are noted but not further characterized on this nondedicated exam. Several differential air-fluid levels are identified over the left upper quadrant with probable mild small bowel dilatation over the left mid abdomen measuring 3.6 cm maximally. Severe left and mild right hip degenerative change. Moderate disc degenerative change in the lumbar spine. Right-sided nephro ureteral stent in place. No free air.  IMPRESSION: Moderately dilated left mid abdominal small bowel loops with differential air-fluid levels suggesting small bowel obstruction.   Electronically Signed   By: Conchita Paris M.D.   On: 07/12/2014 10:38   Dg Abd 2  Views  07/10/2014   CLINICAL DATA:  Nausea and vomiting and abdominal pain. Colon cancer.  EXAM: ABDOMEN - 2 VIEW  COMPARISON:  PET-CT dated 05/02/2014  FINDINGS: Right ureteral stent in place. No free air. There is air in the nondistended distal colon. The patient has had resection of the ascending colon. There is a prominent stool ball in the rectum. There is increased density in the right side of the abdomen which is created by the liver which is pushed inferiorly by the COPD.  There is increased density in the left lower quadrant which could represent distended stool filled colon.  Severe arthritis of the left hip. Diffuse degenerative changes in the lumbar spine. Multiple surgical clips in the left upper quadrant from previous gastric surgery.  IMPRESSION: Prominent stool in the distal colon.  No free air.   Electronically Signed   By: Lorriane Shire M.D.   On: 07/10/2014 19:02     Medical Consultants:    Oncology  Anti-Infectives:    None.  Subjective:   JENINE KRISHER reports diarrhea yesterday, none so far today. Continues to have nausea but no active vomiting. Denies abdominal pain. Reports a skin tear to her bottom and a macerated area under her left breast/flank.  Objective:    Filed Vitals:   07/12/14 0500 07/12/14 1445 07/12/14 2036 07/13/14 0446  BP: 113/56 107/64 121/62 111/59  Pulse: 86 110 92 126  Temp: 97.6 F (36.4 C) 97.4 F (36.3 C) 97.5  F (36.4 C) 97.8 F (36.6 C)  TempSrc: Oral Oral Oral Oral  Resp: 18 20 18 18   Height:      Weight:      SpO2: 100% 100% 100% 98%    Intake/Output Summary (Last 24 hours) at 07/13/14 0729 Last data filed at 07/13/14 2297  Gross per 24 hour  Intake  622.5 ml  Output    250 ml  Net  372.5 ml    Exam: Gen:  NAD, sitting up in chair Cardiovascular:  HSIR, No M/R/G Respiratory:  Lungs CTAB Gastrointestinal:  Abdomen soft, NT/ND, + BS Skin: Left flank with macerated area/erythema Extremities:  3+ edema with anterior  shin erythema   Data Reviewed:    Labs: Basic Metabolic Panel:  Recent Labs Lab 07/10/14 1321 07/10/14 1725 07/11/14 0455  NA 138  --  139  K 3.3*  --  3.6  CL  --   --  110  CO2 20*  --  21*  GLUCOSE 140  --  123*  BUN 34.1*  --  41*  CREATININE 2.2* 2.24* 2.26*  CALCIUM 8.8  --  7.8*   GFR Estimated Creatinine Clearance: 24.2 mL/min (by C-G formula based on Cr of 2.26). Liver Function Tests:  Recent Labs Lab 07/10/14 1321  AST 12  ALT <6  ALKPHOS 73  BILITOT 1.21*  PROT 6.8  ALBUMIN 3.2*    Recent Labs Lab 07/10/14 1725  LIPASE 15*   CBC:  Recent Labs Lab 07/10/14 1321 07/10/14 1725 07/11/14 0455 07/12/14 0600  WBC 2.2* 2.1* 1.7* 1.4*  NEUTROABS 1.4*  --  0.9* 0.6*  HGB 9.0* 8.1* 7.0* 7.7*  HCT 26.8* 23.7* 20.7* 22.6*  MCV 93.9 93.7 93.7 93.4  PLT 148 131* 120* 176   Sepsis Labs:  Recent Labs Lab 07/10/14 1321 07/10/14 1725 07/11/14 0455 07/12/14 0600 07/13/14 0640  WBC 2.2* 2.1* 1.7* 1.4*  --   LATICACIDVEN  --   --   --   --  1.0   Microbiology Recent Results (from the past 240 hour(s))  Clostridium Difficile by PCR (not at [facility])     Status: None   Collection Time: 07/11/14  3:26 AM  Result Value Ref Range Status   C difficile by pcr NEGATIVE NEGATIVE Final     Medications:   . aspirin EC  81 mg Oral Daily  . enoxaparin (LOVENOX) injection  30 mg Subcutaneous Q24H   Continuous Infusions: . dextrose 5 % and 0.45 % NaCl with KCl 20 mEq/L 75 mL/hr at 07/12/14 2100    Time spent: 35 minutes with > 50% of time discussing current diagnostic test results, clinical impression and plan of care.   LOS: 3 days   [facility] Pager [phone]. If unable to reach me by pager, please call my cell phone at [phone].  *Please refer to [email or web address], password [email or web address] to get updated schedule on who will round on this patient, as hospitalists switch teams weekly. If 7PM-7AM, please contact night-coverage at [email or web address],  password [email or web address] for any overnight needs.  07/13/2014, 7:29 AM

## 2014-07-14 DIAGNOSIS — D709 Neutropenia, unspecified: Secondary | ICD-10-CM

## 2014-07-14 DIAGNOSIS — S31801A Laceration without foreign body of unspecified buttock, initial encounter: Secondary | ICD-10-CM | POA: Diagnosis present

## 2014-07-14 DIAGNOSIS — L309 Dermatitis, unspecified: Secondary | ICD-10-CM | POA: Diagnosis present

## 2014-07-14 LAB — CBC WITH DIFFERENTIAL/PLATELET
Basophils Absolute: 0 K/uL (ref 0.0–0.1)
Basophils Relative: 0 % (ref 0–1)
Eosinophils Absolute: 0 K/uL (ref 0.0–0.7)
Eosinophils Relative: 1 % (ref 0–5)
HCT: 23.5 % — ABNORMAL LOW (ref 36.0–46.0)
Hemoglobin: 7.8 g/dL — ABNORMAL LOW (ref 12.0–15.0)
Lymphocytes Relative: 12 % (ref 12–46)
Lymphs Abs: 0.4 K/uL — ABNORMAL LOW (ref 0.7–4.0)
MCH: 31.3 pg (ref 26.0–34.0)
MCHC: 33.2 g/dL (ref 30.0–36.0)
MCV: 94.4 fL (ref 78.0–100.0)
Monocytes Absolute: 0.8 K/uL (ref 0.1–1.0)
Monocytes Relative: 23 % — ABNORMAL HIGH (ref 3–12)
Neutro Abs: 2.4 K/uL (ref 1.7–7.7)
Neutrophils Relative %: 64 % (ref 43–77)
Platelets: 220 K/uL (ref 150–400)
RBC: 2.49 MIL/uL — ABNORMAL LOW (ref 3.87–5.11)
RDW: 19.9 % — ABNORMAL HIGH (ref 11.5–15.5)
WBC: 3.6 K/uL — ABNORMAL LOW (ref 4.0–10.5)

## 2014-07-14 LAB — PREALBUMIN: Prealbumin: 6.2 mg/dL — ABNORMAL LOW (ref 18–38)

## 2014-07-14 MED ORDER — BOOST PLUS PO LIQD
237.0000 mL | Freq: Three times a day (TID) | ORAL | Status: DC
  Administered 2014-07-14 – 2014-07-21 (×3): 237 mL via ORAL
  Filled 2014-07-14 (×23): qty 237

## 2014-07-14 NOTE — Progress Notes (Signed)
CM spoke with patient and her niece in patient's room.  Patient elected [facility] for her DME:  Youth Bariatric Rolling Walker and Bariatric 3in1.  CM called Jeneen Rinks of [facility] at [phone] to arrange equipment.

## 2014-07-14 NOTE — Progress Notes (Signed)
Physical Therapy Treatment Patient Details Name: MARINDA TYER MRN: [id number] DOB: 23-Nov-1940 Today's Date: 07/14/2014    History of Present Illness 74 yo female admitted with diarrhea. Hx of colon cancer, HTn, anemia, partial colectomy 2015, A fib, arthritis.     PT Comments    Staff reported it took "3 of them to get pt OOB to recliner".  Sister present and very helpful.  Pt was able to self rise out of recliner Min Assist + 2 for safety and amb un the hallway.  Mild anxiety/fear of falling was relieved by recliner following behind.  Pt tolerated an increased distance and was able to take 2 walks with a rest period between.    Follow Up Recommendations  Home health PT;Supervision - Intermittent  with sister a primary care giver   Equipment Recommendations  Rolling walker with 5" wheels (Bariatric Youth)    Recommendations for Other Services       Precautions / Restrictions Precautions Precautions: Fall Restrictions Weight Bearing Restrictions: No    Mobility  Bed Mobility               General bed mobility comments: Pt OOB in recliner  Transfers Overall transfer level: Needs assistance Equipment used: Rolling walker (2 wheeled) (Bariatric youth) Transfers: Sit to/from Stand Sit to Stand: +2 safety/equipment;Min assist         General transfer comment: pt able to rise from recliner using forward motion momentum  Ambulation/Gait Ambulation/Gait assistance: Min assist;+2 safety/equipment (rwecliner to follow for safety and pt anxiety) Ambulation Distance (Feet): 38 Feet (20 feet then another 18 feet)   Gait Pattern/deviations: Step-to pattern;Step-through pattern;Wide base of support;Trunk flexed Gait velocity: decreased       Stairs            Wheelchair Mobility    Modified Rankin (Stroke Patients Only)       Balance                                    Cognition Arousal/Alertness: Awake/alert Behavior During Therapy: WFL  for tasks assessed/performed Overall Cognitive Status: Within Functional Limits for tasks assessed                      Exercises      General Comments        Pertinent Vitals/Pain Pain Assessment: No/denies pain Pain Location: mild aches Pain Intervention(s): Monitored during session;Repositioned    Home Living                      Prior Function            PT Goals (current goals can now be found in the care plan section) Progress towards PT goals: Progressing toward goals    Frequency  Min 3X/week    PT Plan      Co-evaluation             End of Session Equipment Utilized During Treatment: Gait belt Activity Tolerance: Patient limited by fatigue Patient left: in chair;with call bell/phone within reach     Time: 9735-3299 PT Time Calculation (min) (ACUTE ONLY): 30 min  Charges:  $Gait Training: 8-22 mins $Therapeutic Activity: 8-22 mins                    G Codes:      Rica Koyanagi  PTA [facility]  Acute  Rehab Pager      [phone]

## 2014-07-14 NOTE — Progress Notes (Signed)
Pt due for lopressor 2.5mg  IV. BP is 94/63 and HR 105, no parameters written on med. Paged NP and received order to hold upcoming dose of lopressor. Pt resting comfortably without complaints. Hortencia Conradi RN

## 2014-07-14 NOTE — Progress Notes (Signed)
Subjective: Nurses documented 2 episodes emesis however pt and family member state it was more of a cough with some liquid in it, very small volume. Had large soft BM yesterday pm. No abd pain. No nausea  Objective: Vital signs in last 24 hours: Temp:  [97.2 F (36.2 C)-98 F (36.7 C)] 98 F (36.7 C) (07/02 0621) Pulse Rate:  [100-111] 111 (07/02 0621) Resp:  [17-18] 17 (07/02 0621) BP: (118-125)/(61-75) 118/63 mmHg (07/02 0621) SpO2:  [100 %] 100 % (07/02 0621) Last BM Date: 07/13/14  Intake/Output from previous day: 07/01 0701 - 07/02 0700 In: 2219.2 [P.O.:480; I.V.:1739.2] Out: 900 [Urine:900] Intake/Output this shift:    Cachectic, nontoxic cta b/l Soft, nt, nd  Lab Results:   Recent Labs  07/12/14 0600 07/14/14 0452  WBC 1.4* 3.6*  HGB 7.7* 7.8*  HCT 22.6* 23.5*  PLT 176 220   BMET No results for input(s): NA, K, CL, CO2, GLUCOSE, BUN, CREATININE, CALCIUM in the last 72 hours. PT/INR No results for input(s): LABPROT, INR in the last 72 hours. ABG No results for input(s): PHART, HCO3 in the last 72 hours.  Invalid input(s): PCO2, PO2  Studies/Results: Ct Abdomen Pelvis Wo Contrast  07/12/2014   CLINICAL DATA:  Acute generalized abdominal pain.  EXAM: CT ABDOMEN AND PELVIS WITHOUT CONTRAST  TECHNIQUE: Multidetector CT imaging of the abdomen and pelvis was performed following the standard protocol without IV contrast.  COMPARISON:  CT scan of April 05, 2014.  FINDINGS: Severe multilevel degenerative disc disease is noted in the lumbar spine. Minimal right pleural effusion is noted. Right middle lobe airspace opacity is noted concerning for pneumonia.  Status post cholecystectomy. Mild splenomegaly is noted. Visualized portion of pancreas appears normal. Atherosclerosis of abdominal aorta is noted without aneurysm formation. Left adrenal gland is not well visualized. Right adrenal gland appears normal. Moderate right hydronephrosis is noted. Ureteral stent is seen  extending from inferior portion of right renal pelvis to urinary bladder. Prominent left extrarenal pelvis is noted without ureteral dilatation. There is interval development of small bowel dilatation concerning for distal obstruction. Status post right colectomy. Stable soft tissue density is seen in surgical site in right lower quadrant of anterior abdominal wall, which appears to extend into the peritoneal space in the right lower quadrant. No abnormal fluid collection is noted. Urinary bladder is decompressed. Status post hysterectomy.  IMPRESSION: Right middle lobe airspace opacity is noted concerning for pneumonia.  Mild splenomegaly is noted.  Status post cholecystectomy, hysterectomy and right colectomy. Stable soft tissue density is seen in the region of surgical site in the right lower quadrant of the anterior abdominal wall which extends into peritoneal space in the right lower quadrant around presumed position of ileocolic anastomosis. While this may simply represent postoperative scarring, recurrent or metastatic malignancy cannot be excluded.  Interval development of small bowel dilatation concerning for distal small bowel obstruction.  Interval placement of right-sided ureteral stent seen extending from right renal pelvis to urinary bladder.   Electronically Signed   By: Marijo Conception, M.D.   On: 07/12/2014 17:37   Dg Abd 2 Views  07/12/2014   CLINICAL DATA:  Nausea and vomiting, metastatic colon cancer  EXAM: ABDOMEN - 2 VIEW  COMPARISON:  07/10/2014  FINDINGS: Left upper quadrant clips are noted. Right-sided Port-A-Cath in place with tip over the distal SVC. Patchy bibasilar airspace opacities are noted but not further characterized on this nondedicated exam. Several differential air-fluid levels are identified over the left upper quadrant  with probable mild small bowel dilatation over the left mid abdomen measuring 3.6 cm maximally. Severe left and mild right hip degenerative change. Moderate  disc degenerative change in the lumbar spine. Right-sided nephro ureteral stent in place. No free air.  IMPRESSION: Moderately dilated left mid abdominal small bowel loops with differential air-fluid levels suggesting small bowel obstruction.   Electronically Signed   By: Conchita Paris M.D.   On: 07/12/2014 10:38    Anti-infectives: Anti-infectives    None      Assessment/Plan: 1. 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 PSBO, N/V/ diarrhea -the patient is having some diarrhea, so she is not completely obstructed, but partially obstructed. She likely has a somewhat high-grade partial obstruction. Ideally, given she is malnourished and undergoing recent chemo, if she can resolve this on her own that would be good. She may only be able to take in a full liquid or pureed diet at best. If she does not improve with conservative management, she may require a palliative operation ileostomy (which could worsen kidney function) and/or placement of a gastrostomy tube. As stated earlier, given her malnutrition and recent chemo, the patient would be at high risk for surgical complications such as wound healing or healing of any type of anastomosis if created. For now, she is on clear liquids and we will see how she tolerates this. i think she can cont with clears. Will add boost and she how does.  3. Severe protein calorie malnutrition - prealb 6 4. A fib -not on blood thinners 5. Chronic kidney disease 6. Right sided hydronephrosis with stent  7. Neutropenia   Leighton Ruff. Redmond Pulling, MD, FACS General, Bariatric, & Minimally Invasive Surgery [facility], [place]   LOS: 4 days    Gayland Curry 07/14/2014

## 2014-07-14 NOTE — Progress Notes (Signed)
Progress Note   Natalie Chapman AJO:[id number] DOB: [birth year] DOA: 07/10/2014 PCP: Reymundo Poll, MD   Brief Narrative:   Natalie Chapman is an 74 y.o. female with past medical history colon cancer undergoing chemotherapy last chemotherapy 6/16, chronic kidney disease stage III, history of right-sided hydronephrosis who underwent ureteral stent placement in 08/03/2014. Presented to the hospital complaining of vomiting and diarrhea. C. difficile negative. CT of the abdomen done 07/12/14 concerning for SBO.  Assessment/Plan:   Principal Problem:   Small bowel obstruction - Status post CT scan 07/12/14 showing findings concerning for distal small bowel obstruction. - Seen by surgery 07/13/14 with plans to advance diet as tolerated and discharge home if stable. - May need a palliative G-tube or diverting loop ileostomy if she remains symptomatic.  Active Problems:   Colon cancer- pT4a, pN2b, MX, ascending colon s/p lap assisted right colectomy 07/20/13 - Oncologist following. CT scan findings concerning for possible recurrence versus scar tissue at operative site. - Last chemotherapy with FOLFOX given 06/28/14.    Atrial fibrillation with rapid ventricular response - Heart rate 100-111. - Metoprolol currently on hold given SBO. Continue IV metoprolol 2.5 mg every 6 hours to address rate control. - Continue aspirin. - No anticoagulation secondary to history of GI bleeding.    Anti-neoplastic chemotherapy induced pancytopenia / neutropenia - Monitor counts. - Platelet count back to normal limits. - Remains neutropenic and anemic. - Transfuse PRBCs for hemoglobin less than 7. No current indication for transfusion. Received 2 units of PRBCs on 06/28/14.    Morbid obesity  - Status post gastric banding surgery.    Dehydration - Continue IV fluids.    Right hydronephrosis - Status post ureteral stent 04/27/14.    Elevated bilirubin - No mention of ductal dilatation on CT scan of  abdomen done 07/12/14. Other LFTs WNL.    Right middle lobe opacity noted on CT done 07/12/14 - No clinical evidence of pneumonia, but low threshold to start antibiotics if she becomes febrile. - Afebrile, WBC 3.6. Defer antibiotics for now.    Diarrhea - C. difficile studies negative. Follow-up GI pathogen panel also negative.    Chronic kidney disease (CKD), stage III (moderate) - Baseline creatinine 2.2, current creatinine consistent with usual baseline values.    Intertriginous dermatitis left breast/skin tear intergluteal fold - Seen by wound care nurse 07/13/14. Has intertriginous dermatitis beneath left breast and a skin tear on her buttock cleft. - Wound care per recommendations.    DVT Prophylaxis - Lovenox.  Family Communication: Sister updated at the bedside. Disposition Plan: Home later today or tomorrow if she is able to keep liquids down. Code Status:     Code Status Orders        Start     Ordered   07/10/14 1558  Full code   Continuous     07/10/14 1557        IV Access:    Port-A-Cath   Procedures and diagnostic studies:   Ct Abdomen Pelvis Wo Contrast  07/12/2014   CLINICAL DATA:  Acute generalized abdominal pain.  EXAM: CT ABDOMEN AND PELVIS WITHOUT CONTRAST  TECHNIQUE: Multidetector CT imaging of the abdomen and pelvis was performed following the standard protocol without IV contrast.  COMPARISON:  CT scan of April 05, 2014.  FINDINGS: Severe multilevel degenerative disc disease is noted in the lumbar spine. Minimal right pleural effusion is noted. Right middle lobe airspace opacity is noted concerning for pneumonia.  Status post  cholecystectomy. Mild splenomegaly is noted. Visualized portion of pancreas appears normal. Atherosclerosis of abdominal aorta is noted without aneurysm formation. Left adrenal gland is not well visualized. Right adrenal gland appears normal. Moderate right hydronephrosis is noted. Ureteral stent is seen extending from inferior  portion of right renal pelvis to urinary bladder. Prominent left extrarenal pelvis is noted without ureteral dilatation. There is interval development of small bowel dilatation concerning for distal obstruction. Status post right colectomy. Stable soft tissue density is seen in surgical site in right lower quadrant of anterior abdominal wall, which appears to extend into the peritoneal space in the right lower quadrant. No abnormal fluid collection is noted. Urinary bladder is decompressed. Status post hysterectomy.  IMPRESSION: Right middle lobe airspace opacity is noted concerning for pneumonia.  Mild splenomegaly is noted.  Status post cholecystectomy, hysterectomy and right colectomy. Stable soft tissue density is seen in the region of surgical site in the right lower quadrant of the anterior abdominal wall which extends into peritoneal space in the right lower quadrant around presumed position of ileocolic anastomosis. While this may simply represent postoperative scarring, recurrent or metastatic malignancy cannot be excluded.  Interval development of small bowel dilatation concerning for distal small bowel obstruction.  Interval placement of right-sided ureteral stent seen extending from right renal pelvis to urinary bladder.   Electronically Signed   By: Marijo Conception, M.D.   On: 07/12/2014 17:37   Dg Abd 2 Views  07/12/2014   CLINICAL DATA:  Nausea and vomiting, metastatic colon cancer  EXAM: ABDOMEN - 2 VIEW  COMPARISON:  07/10/2014  FINDINGS: Left upper quadrant clips are noted. Right-sided Port-A-Cath in place with tip over the distal SVC. Patchy bibasilar airspace opacities are noted but not further characterized on this nondedicated exam. Several differential air-fluid levels are identified over the left upper quadrant with probable mild small bowel dilatation over the left mid abdomen measuring 3.6 cm maximally. Severe left and mild right hip degenerative change. Moderate disc degenerative change  in the lumbar spine. Right-sided nephro ureteral stent in place. No free air.  IMPRESSION: Moderately dilated left mid abdominal small bowel loops with differential air-fluid levels suggesting small bowel obstruction.   Electronically Signed   By: Conchita Paris M.D.   On: 07/12/2014 10:38   Dg Abd 2 Views  07/10/2014   CLINICAL DATA:  Nausea and vomiting and abdominal pain. Colon cancer.  EXAM: ABDOMEN - 2 VIEW  COMPARISON:  PET-CT dated 05/02/2014  FINDINGS: Right ureteral stent in place. No free air. There is air in the nondistended distal colon. The patient has had resection of the ascending colon. There is a prominent stool ball in the rectum. There is increased density in the right side of the abdomen which is created by the liver which is pushed inferiorly by the COPD.  There is increased density in the left lower quadrant which could represent distended stool filled colon.  Severe arthritis of the left hip. Diffuse degenerative changes in the lumbar spine. Multiple surgical clips in the left upper quadrant from previous gastric surgery.  IMPRESSION: Prominent stool in the distal colon.  No free air.   Electronically Signed   By: Lorriane Shire M.D.   On: 07/10/2014 19:02     Medical Consultants:    Oncology  Anti-Infectives:    None.  Subjective:   Natalie Chapman reports 2 loose stools yesterday, pudding consistency.  Vomited twice yesterday, no emesis today.  No stools yet today.  Passing flatus.  No abdominal pain.  Objective:    Filed Vitals:   07/13/14 0700 07/13/14 1407 07/13/14 2043 07/14/14 0621  BP:  121/75 125/61 118/63  Pulse: 108 100 100 111  Temp:  97.6 F (36.4 C) 97.2 F (36.2 C) 98 F (36.7 C)  TempSrc:  Oral Axillary Oral  Resp:  18 18 17   Height:      Weight:      SpO2:  100% 100% 100%    Intake/Output Summary (Last 24 hours) at 07/14/14 0715 Last data filed at 07/14/14 0600  Gross per 24 hour  Intake 2219.19 ml  Output    900 ml  Net 1319.19 ml     Exam: Gen:  NAD Cardiovascular:  HSIR, No M/R/G Respiratory:  Lungs CTAB, diminished in the bases Gastrointestinal:  Abdomen soft, NT/ND, + BS Extremities:  3+ edema with anterior shin erythema   Data Reviewed:    Labs: Basic Metabolic Panel:  Recent Labs Lab 07/10/14 1321 07/10/14 1725 07/11/14 0455  NA 138  --  139  K 3.3*  --  3.6  CL  --   --  110  CO2 20*  --  21*  GLUCOSE 140  --  123*  BUN 34.1*  --  41*  CREATININE 2.2* 2.24* 2.26*  CALCIUM 8.8  --  7.8*   GFR Estimated Creatinine Clearance: 24.2 mL/min (by C-G formula based on Cr of 2.26). Liver Function Tests:  Recent Labs Lab 07/10/14 1321  AST 12  ALT <6  ALKPHOS 73  BILITOT 1.21*  PROT 6.8  ALBUMIN 3.2*    Recent Labs Lab 07/10/14 1725  LIPASE 15*   CBC:  Recent Labs Lab 07/10/14 1321 07/10/14 1725 07/11/14 0455 07/12/14 0600 07/14/14 0452  WBC 2.2* 2.1* 1.7* 1.4* 3.6*  NEUTROABS 1.4*  --  0.9* 0.6* 2.4  HGB 9.0* 8.1* 7.0* 7.7* 7.8*  HCT 26.8* 23.7* 20.7* 22.6* 23.5*  MCV 93.9 93.7 93.7 93.4 94.4  PLT 148 131* 120* 176 220   Sepsis Labs:  Recent Labs Lab 07/10/14 1725 07/11/14 0455 07/12/14 0600 07/13/14 0640 07/14/14 0452  WBC 2.1* 1.7* 1.4*  --  3.6*  LATICACIDVEN  --   --   --  1.0  --    Microbiology Recent Results (from the past 240 hour(s))  Clostridium Difficile by PCR (not at [facility])     Status: None   Collection Time: 07/11/14  3:26 AM  Result Value Ref Range Status   C difficile by pcr NEGATIVE NEGATIVE Final     Medications:   . aspirin EC  81 mg Oral Daily  . enoxaparin (LOVENOX) injection  30 mg Subcutaneous Q24H  . metoprolol  2.5 mg Intravenous 4 times per day   Continuous Infusions: . dextrose 5 % and 0.45 % NaCl with KCl 20 mEq/L 75 mL/hr at 07/13/14 2349    Time spent: 25 minutes.   LOS: 4 days   [facility] Pager [phone]. If unable to reach me by pager, please call my cell phone at [phone].  *Please refer  to [email or web address], password [email or web address] to get updated schedule on who will round on this patient, as hospitalists switch teams weekly. If 7PM-7AM, please contact night-coverage at [email or web address], password [email or web address] for any overnight needs.  07/14/2014, 7:15 AM

## 2014-07-15 DIAGNOSIS — L8990 Pressure ulcer of unspecified site, unspecified stage: Secondary | ICD-10-CM | POA: Insufficient documentation

## 2014-07-15 DIAGNOSIS — Z515 Encounter for palliative care: Secondary | ICD-10-CM

## 2014-07-15 DIAGNOSIS — N1330 Unspecified hydronephrosis: Secondary | ICD-10-CM

## 2014-07-15 LAB — COMPREHENSIVE METABOLIC PANEL
ALK PHOS: 80 U/L (ref 38–126)
ALT: 10 U/L — ABNORMAL LOW (ref 14–54)
AST: 13 U/L — AB (ref 15–41)
Albumin: 3 g/dL — ABNORMAL LOW (ref 3.5–5.0)
Anion gap: 8 (ref 5–15)
BILIRUBIN TOTAL: 1 mg/dL (ref 0.3–1.2)
BUN: 35 mg/dL — AB (ref 6–20)
CALCIUM: 8.5 mg/dL — AB (ref 8.9–10.3)
CO2: 22 mmol/L (ref 22–32)
Chloride: 108 mmol/L (ref 101–111)
Creatinine, Ser: 1.8 mg/dL — ABNORMAL HIGH (ref 0.44–1.00)
GFR calc Af Amer: 31 mL/min — ABNORMAL LOW (ref >60)
GFR, EST NON AFRICAN AMERICAN: 27 mL/min — AB (ref >60)
GLUCOSE: 108 mg/dL — AB (ref 65–99)
Potassium: 4.4 mmol/L (ref 3.5–5.1)
Sodium: 138 mmol/L (ref 135–145)
TOTAL PROTEIN: 6.7 g/dL (ref 6.5–8.1)

## 2014-07-15 LAB — PHOSPHORUS: Phosphorus: <1 mg/dL — CL (ref 2.5–4.6)

## 2014-07-15 LAB — MAGNESIUM: Magnesium: 1.5 mg/dL — ABNORMAL LOW (ref 1.7–2.4)

## 2014-07-15 MED ORDER — PHENOL 1.4 % MT LIQD
1.0000 | OROMUCOSAL | Status: DC | PRN
  Filled 2014-07-15: qty 177

## 2014-07-15 MED ORDER — METOCLOPRAMIDE HCL 5 MG/ML IJ SOLN: 5.0000 mg | Freq: Three times a day (TID) | INTRAMUSCULAR | Status: DC

## 2014-07-15 MED ORDER — ONDANSETRON HCL 4 MG/2ML IJ SOLN
4.0000 mg | INTRAMUSCULAR | Status: DC | PRN
Start: 2014-07-15 — End: 2014-07-21
  Administered 2014-07-17 – 2014-07-20 (×2): 4 mg via INTRAVENOUS
  Filled 2014-07-15 (×2): qty 2

## 2014-07-15 MED ORDER — M.V.I. ADULT IV INJ
INJECTION | INTRAVENOUS | Status: AC
  Administered 2014-07-15: 19:00:00 via INTRAVENOUS
  Filled 2014-07-15: qty 720

## 2014-07-15 MED ORDER — FAT EMULSION 20 % IV EMUL
240.0000 mL | INTRAVENOUS | Status: AC
  Administered 2014-07-15: 240 mL via INTRAVENOUS
  Filled 2014-07-15: qty 250

## 2014-07-15 MED ORDER — MAGNESIUM SULFATE 2 GM/50ML IV SOLN
2.0000 g | Freq: Once | INTRAVENOUS | Status: AC
  Administered 2014-07-15: 2 g via INTRAVENOUS
  Filled 2014-07-15: qty 50

## 2014-07-15 MED ORDER — SODIUM PHOSPHATE 3 MMOLE/ML IV SOLN
30.0000 mmol | Freq: Once | INTRAVENOUS | Status: AC
  Administered 2014-07-15: 30 mmol via INTRAVENOUS
  Filled 2014-07-15: qty 10

## 2014-07-15 MED ORDER — KCL IN DEXTROSE-NACL 20-5-0.45 MEQ/L-%-% IV SOLN
INTRAVENOUS | Status: DC
  Administered 2014-07-15 – 2014-07-20 (×4): via INTRAVENOUS
  Filled 2014-07-15 (×6): qty 1000

## 2014-07-15 NOTE — Progress Notes (Signed)
Patient and family refuse to put on slip resistant socks when getting up to use bedside commode. Staff explained purpose of socks to help prevent falls due to slipping when moving.  Family states patient dribbles when getting up and does not want to wear socks.  Family and patient state they understand purpose of socks but will not wear them at this time.

## 2014-07-15 NOTE — Consult Note (Signed)
Consultation Note Date: 07/15/2014   Patient Name: Natalie Chapman  DOB: [birth year]  MRN: [id number]  Age / Sex: 74 y.o., female   PCP: Reymundo Poll, MD Referring Physician: Venetia Maxon Rama, MD  Reason for Consultation: Establishing goals of care  Palliative Care Assessment and Plan Summary of Established Goals of Care and Medical Treatment Preferences    Natalie Chapman is an 74 y.o. female with past medical history colon cancer undergoing chemotherapy last chemotherapy 6/16, chronic kidney disease stage III, history of right-sided hydronephrosis who underwent ureteral stent placement in 08/03/2014. Patient has received Xeloda and Folfox. She is s/p hemi colectomy. Presented to the hospital complaining of vomiting and diarrhea. C. difficile negative. CT of the abdomen done 07/12/14 concerning for SBO.  Patient has been seen by surgery, she has been started on clears. Surgery contemplating whether the patient will need venting gastrostomy tube or even whether the procedure will be surgically feasible given the patient's current condition.   Palliative care has been consulted to engage the patient and her family in goals of care discussions.   The patient is resting in a chair. She appears pale and weak. She is tearful. Briefly introduced scope of palliative medicine. Also discussed hospice services briefly when patient asked. Discussed patient's current condition, obstruction. Over all, the patient states she feels overwhelmingly tired. She states she feels extremely fatigued. She is tearful. She states she has a loving supportive family and does not want to leave them. Conversely, she states that her manage her time to go she is ready to go.  Patient does not want to have goals of care discussions without her daughter being present. She is due to arrive back at the hospital later today or on 07-16-14. Patient's son Nicki Reaper has been given information on how to contact the palliative medicine team. We  will set up a family meeting when all family members are available. Further recommendations will follow. We will discuss CODE STATUS DO NOT RESUSCITATE, we will discuss focusing on symptom management and comfort measures, we will discuss addition of hospice services to her overall plan of care.  Contacts/Participants in Discussion: Primary Decision Maker:     HCPOA: yes    Code Status/Advance Care Planning:  Full code for now. Awaiting family meeting for further discussions.  Symptom Management:     Palliative Prophylaxis: Yes  Additional Recommendations (Limitations, Scope, Preferences):   Psycho-social/Spiritual:   Support System: Sisters, son and daughter  Desire for further Chaplaincy support:no  Prognosis: < 6 months  Discharge Planning:  To be determined. Might include addition of hospice services.   Values: To be explored and discussed in family meeting Life limiting illness: Colon cancer      Chief Complaint/History of Present Illness: vomiting abdominal pain  Primary Diagnoses  Present on Admission:  . Dehydration . Diarrhea . Colon cancer- pT4a, pN2b, MX, ascending colon s/p lap assisted right colectomy 07/20/13 . Atrial fibrillation . Anemia . Chronic kidney disease (CKD), stage III (moderate) . (Resolved) Pressure ulcer . Small bowel obstruction . Antineoplastic chemotherapy induced pancytopenia . Elevated bilirubin . Morbid obesity . Atrial fibrillation with RVR . Neutropenia . Hydronephrosis, right . Nausea & vomiting . Tear of skin of buttock . Intertriginous dermatitis  Palliative Review of Systems: noted  I have reviewed the medical record, interviewed the patient and family, and examined the patient. The following aspects are pertinent.  Past Medical History  Diagnosis Date  . Hypertension   . Anemia   .  H/O hiatal hernia   . Chronic atrial fibrillation     CARDIOLOGIST-- DR Johnsie Cancel  . Arthritis     LEFT HIP  . Generalized  weakness   . Colon cancer dx mar  2015    Stage IIIc, pT4a  pN2b, moderate differentiated adenocarcinoma right colon with 8of15 +lymph nodes for metastatic carcinoma--  s/p  right colectomy 07-20-2013;  chemotherapy 09-05-2013 to 01-30-2014  . 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 Poor appetite   . Frequency of urination   . Wears glasses   . Full dentures   . Dysrhythmia     AFIB  . Chronic kidney disease (CKD), stage I   . Hydronephrosis, right   . Seroma, postoperative 07/27/2013   History   Social History  . Marital Status: Married    Spouse Name: N/A  . Number of Children: N/A  . Years of Education: N/A   Social History Main Topics  . Smoking status: Former Smoker -- 1.00 packs/day for 30 years    Types: Cigarettes    Quit date: 04/19/1990  . Smokeless tobacco: Never Used  . Alcohol Use: No  . Drug Use: No  . Sexual Activity: Not on file   Other Topics Concern  . None   Social History Narrative   Family History  Problem Relation Age of Onset  . Heart disease Mother   . Heart disease Father    Scheduled Meds: . aspirin EC  81 mg Oral Daily  . enoxaparin (LOVENOX) injection  30 mg Subcutaneous Q24H  . lactose free nutrition  237 mL Oral TID WC  . metoprolol  2.5 mg Intravenous 4 times per day   Continuous Infusions: . dextrose 5 % and 0.45 % NaCl with KCl 20 mEq/L 75 mL/hr at 07/15/14 0517   PRN Meds:.acetaminophen **OR** acetaminophen, albuterol, labetalol, lidocaine-prilocaine, morphine injection, ondansetron **OR** ondansetron (ZOFRAN) IV, promethazine Medications Prior to Admission:  Prior to Admission medications   Medication Sig Start Date End Date Taking? Authorizing Provider  acetaminophen (TYLENOL) 650 MG CR tablet Take 1,300 mg by mouth every morning. Arthritis pain   Yes Historical Provider, MD  aspirin EC 81 MG tablet Take 81 mg by mouth daily.   Yes Historical Provider, MD  atorvastatin (LIPITOR) 10 MG tablet Take 5 mg by mouth daily with breakfast.     Yes Historical Provider, MD  docusate sodium (COLACE) 100 MG capsule Take 100 mg by mouth daily as needed for mild constipation.   Yes Historical Provider, MD  lidocaine-prilocaine (EMLA) cream Apply 1 application topically as needed. APPLY TO PORTACATH 1-2 HOURS PRIOR TO USE 05/03/14  Yes Owens Shark, NP  metoprolol succinate (TOPROL-XL) 25 MG 24 hr tablet Take 25 mg by mouth every morning.    Yes Historical Provider, MD  Multiple Vitamins-Iron (MULTIVITAMIN/IRON PO) Take 1 tablet by mouth daily.   Yes Historical Provider, MD  polyethylene glycol (MIRALAX / GLYCOLAX) packet Take 17 g by mouth daily as needed for mild constipation.    Yes Historical Provider, MD  prochlorperazine (COMPAZINE) 5 MG tablet Take 1 tablet (5 mg total) by mouth every 6 (six) hours as needed for nausea or vomiting. 05/03/14  Yes Owens Shark, NP   No Known Allergies CBC:    Component Value Date/Time   WBC 3.6* 07/14/2014 0452   WBC 2.2* 07/10/2014 1321   HGB 7.8* 07/14/2014 0452   HGB 9.0* 07/10/2014 1321   HCT 23.5* 07/14/2014 0452   HCT 26.8* 07/10/2014 1321  PLT 220 07/14/2014 0452   PLT 148 07/10/2014 1321   MCV 94.4 07/14/2014 0452   MCV 93.9 07/10/2014 1321   NEUTROABS 2.4 07/14/2014 0452   NEUTROABS 1.4* 07/10/2014 1321   LYMPHSABS 0.4* 07/14/2014 0452   LYMPHSABS 0.2* 07/10/2014 1321   MONOABS 0.8 07/14/2014 0452   MONOABS 0.6 07/10/2014 1321   EOSABS 0.0 07/14/2014 0452   EOSABS 0.0 07/10/2014 1321   BASOSABS 0.0 07/14/2014 0452   BASOSABS 0.0 07/10/2014 1321   Comprehensive Metabolic Panel:    Component Value Date/Time   NA 139 07/11/2014 0455   NA 138 07/10/2014 1321   K 3.6 07/11/2014 0455   K 3.3* 07/10/2014 1321   CL 110 07/11/2014 0455   CO2 21* 07/11/2014 0455   CO2 20* 07/10/2014 1321   BUN 41* 07/11/2014 0455   BUN 34.1* 07/10/2014 1321   CREATININE 2.26* 07/11/2014 0455   CREATININE 2.2* 07/10/2014 1321   GLUCOSE 123* 07/11/2014 0455   GLUCOSE 140 07/10/2014 1321    CALCIUM 7.8* 07/11/2014 0455   CALCIUM 8.8 07/10/2014 1321   AST 12 07/10/2014 1321   AST 22 05/15/2014 0724   ALT <6 07/10/2014 1321   ALT 12* 05/15/2014 0724   ALKPHOS 73 07/10/2014 1321   ALKPHOS 71 05/15/2014 0724   BILITOT 1.21* 07/10/2014 1321   BILITOT 0.7 05/15/2014 0724   PROT 6.8 07/10/2014 1321   PROT 8.1 05/15/2014 0724   ALBUMIN 3.2* 07/10/2014 1321   ALBUMIN 3.9 05/15/2014 0724    Physical Exam: Vital Signs: BP 101/55 mmHg  Pulse 102  Temp(Src) 97.6 F (36.4 C) (Oral)  Resp 16  Ht 5' (1.524 m)  Wt 107.502 kg (237 lb)  BMI 46.29 kg/m2  SpO2 100% SpO2: SpO2: 100 % O2 Device: O2 Device: Not Delivered O2 Flow Rate:   Intake/output summary:  Intake/Output Summary (Last 24 hours) at 07/15/14 1239 Last data filed at 07/15/14 0300  Gross per 24 hour  Intake    730 ml  Output    575 ml  Net    155 ml   LBM: Last BM Date: 07/13/14 Baseline Weight: Weight: 107.502 kg (237 lb) Most recent weight: Weight: 107.502 kg (237 lb)  Exam Findings:  Frail pale appearing lady resting in chair Chest clear S1S2 Abdomen soft No edema Non focal                       Palliative Performance Scale:   30% Additional Data Reviewed: Recent Labs     07/14/14  0452  WBC  3.6*  HGB  7.8*  PLT  220     Time In: 1300 Time Out: 1355 Time Total: 55 min Greater than 50%  of this time was spent counseling and coordinating care related to the above assessment and plan.  Signed by: Loistine Chance, MD [phone] Loistine Chance, MD  07/15/2014, 12:39 PM  Please contact Palliative Medicine Team phone at [phone] for questions and concerns.

## 2014-07-15 NOTE — Progress Notes (Addendum)
FAMILY MEETING:  Met with the patient, her son Nicki Reaper and her daughter. The patient lives with her daughter Kermit Balo [phone]).   Patient's current acute and chronic medical conditions discussed. Discussed patient's underlying malignancy, her obstruction. Discussed patient's ongoing nausea and fatigue.   Daughter and son state that they would not want the patient to get more chemotherapy. They are interested in finding out if the gastrostomy will help. They are also not opposed to TNA and NGT. They would like to get input from Dr. Benay Spice as well as Dr Zella Richer.   Discussed code status in detail. Patient has made her wishes known that she would not want CPR, would not want intubation or mechanical ventilation. Discussed DNR DNI in detail, patient in favor of DNR DNI.   Goals of care:  DNR DNI, no other limits on care desired as of now.   Goals are not comfort only at this point.   Discussed possible involvement with hospice services after D/C based on the outcome of the remainder of this hospitalization. Patient's defers to her son and daughter to make this decision. Patient's son Nicki Reaper has had experience with [facility]. Patient lives with her daughter. Discussed D/C home with hospice versus transfer to hospice house towards the end of this hospitalization.   Will add scheduled Reglan to patient's medication regimen and monitor.   Patient became nauseous multiple times during the conversation. She is tearful. ' I'm just tired, ok, I'm just so tired."  Palliative team will continue to follow, will continue to guide decision making.  35 minutes spent.   Loistine Chance, [place] [phone]

## 2014-07-15 NOTE — Progress Notes (Signed)
  Subjective: Not having a good morning. No abd pain. No flatus. No bm yesterday. Per family member total fluid intake yesterday was less than 1 cup. Had small emesis - handful, green tinged. Thinks warmer liquids may be more helpful  Objective: Vital signs in last 24 hours: Temp:  [97.5 F (36.4 C)-97.6 F (36.4 C)] 97.6 F (36.4 C) (07/03 0500) Pulse Rate:  [96-105] 99 (07/03 0500) Resp:  [16-18] 16 (07/03 0500) BP: (94-120)/(54-64) 120/59 mmHg (07/03 0500) SpO2:  [99 %-100 %] 100 % (07/03 0500) Last BM Date: 07/13/14  Intake/Output from previous day: 07/02 0701 - 07/03 0700 In: 30 [P.O.:360; I.V.:610] Out: 875 [Urine:825; Emesis/NG output:50] Intake/Output this shift:    Obese, sitting in chair, cachetic Soft, nt, nd  Lab Results:   Recent Labs  07/14/14 0452  WBC 3.6*  HGB 7.8*  HCT 23.5*  PLT 220   BMET No results for input(s): NA, K, CL, CO2, GLUCOSE, BUN, CREATININE, CALCIUM in the last 72 hours. PT/INR No results for input(s): LABPROT, INR in the last 72 hours. ABG No results for input(s): PHART, HCO3 in the last 72 hours.  Invalid input(s): PCO2, PO2  Studies/Results: No results found.  Anti-infectives: Anti-infectives    None      Assessment/Plan: 1. 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 PSBO, N/V/ diarrhea -the patient is having some diarrhea, so she is not completely obstructed, but partially obstructed. She likely has a somewhat high-grade partial obstruction. Ideally, given she is malnourished and undergoing recent chemo, if she can resolve this on her own that would be good. She may only be able to take in a full liquid or pureed diet at best. If she does not improve with conservative management, she may require a palliative operation ileostomy (which could worsen kidney function) and/or placement of a gastrostomy tube. As stated earlier, given her  malnutrition and recent chemo, the patient would be at high risk for surgical complications such as wound healing or healing of any type of anastomosis if created. For now, she is on clear liquids and we will see how she tolerates this. i think she can cont with clears. Will add boost and she how does.  3. Severe protein calorie malnutrition - prealb 6 4. A fib -not on blood thinners 5. Chronic kidney disease 6. Right sided hydronephrosis with stent  7. Neutropenia   She is not taking in much liquid. i do not think she is safe to go home. She is heading toward needing a palliative procedure in my opinion. g tube vs ileostomy. Significant pros/cons to each.  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 Redmond Pulling, MD, FACS General, Bariatric, & Minimally Invasive Surgery [facility], [place]   LOS: 5 days    Gayland Curry 07/15/2014

## 2014-07-15 NOTE — Progress Notes (Signed)
Pt sleeping in recliner tonight. Pt adamant that she does not want to use air bed as it took 3 person assist to help her out of it yesterday morning. Pt educated on benefits that air bed provides for her skin issues, but she made it clear she didn't want to use the air bed. Order cancelled and bed removed from room. Pt also declined to wear prevalon boots. She is sitting in the recliner and her heels hang just over the end of the chair and do not have any pressure applied to them. I told her if she decides she wants to sleep in the regular bed at some point, it would be a good idea to wear them. Interdry cloth applied to left abdominal skin fold per order. Continue to monitor. Hortencia Conradi RN

## 2014-07-15 NOTE — Progress Notes (Signed)
NG tube placed per MD order. Patient tolerated procedure well. Placement verified by auscultation. NG tube to low intermittent suction. Pt reports no more nausea shortly after suction initiated. Will continue to monitor.

## 2014-07-15 NOTE — Progress Notes (Addendum)
PARENTERAL NUTRITION CONSULT NOTE - INITIAL  Pharmacy Consult for TPN Indication: Bowel obstruction  No Known Allergies  Patient Measurements: Height: 5' (152.4 cm) Weight: 237 lb (107.502 kg) IBW/kg (Calculated) : 45.5 Adjusted Body Weight: 63.6 kg Usual Weight:   Vital Signs: Temp: 97.9 F (36.6 C) (07/03 1331) Temp Source: Oral (07/03 1331) BP: 117/51 mmHg (07/03 1331) Pulse Rate: 112 (07/03 1331) Intake/Output from previous day: 07/02 0701 - 07/03 0700 In: 74 [P.O.:360; I.V.:610] Out: 875 [Urine:825; Emesis/NG output:50] Intake/Output from this shift: Total I/O In: -  Out: 325 [Urine:325]  Labs:  Recent Labs  07/14/14 0452  WBC 3.6*  HGB 7.8*  HCT 23.5*  PLT 220     Recent Labs  07/14/14 0452  PREALBUMIN 6.2*   Estimated Creatinine Clearance: 24.2 mL/min (by C-G formula based on Cr of 2.26).   No results for input(s): GLUCAP in the last 72 hours.  Medical History: Past Medical History  Diagnosis Date  . Hypertension   . Anemia   . H/O hiatal hernia   . Chronic atrial fibrillation     CARDIOLOGIST-- DR Johnsie Cancel  . Arthritis     LEFT HIP  . Generalized weakness   . Colon cancer dx mar  2015    Stage IIIc, pT4a  pN2b, moderate differentiated adenocarcinoma right colon with 8of15 +lymph nodes for metastatic carcinoma--  s/p  right colectomy 07-20-2013;  chemotherapy 09-05-2013 to 01-30-2014  . 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 Poor appetite   . Frequency of urination   . Wears glasses   . Full dentures   . Dysrhythmia     AFIB  . Chronic kidney disease (CKD), stage I   . Hydronephrosis, right   . Seroma, postoperative 07/27/2013    Medications:  Scheduled:  . aspirin EC  81 mg Oral Daily  . enoxaparin (LOVENOX) injection  30 mg Subcutaneous Q24H  . lactose free nutrition  237 mL Oral TID WC  . metoprolol  2.5 mg Intravenous 4 times per day   Infusions:  . dextrose 5 % and 0.45 % NaCl with KCl 20 mEq/L 75 mL/hr at 07/15/14 0517   PRN:  acetaminophen **OR** acetaminophen, albuterol, labetalol, lidocaine-prilocaine, morphine injection, ondansetron **OR** ondansetron (ZOFRAN) IV, promethazine   Insulin Requirements: none ordered  Current Nutrition:  Clear liquids Boost Plus TID with meals  IVF: D5 1/2 NS w/ 20 KCl at 75 ml/hr  Central access: implanted port TPN start date: 7/3  ASSESSMENT                                                                                                          HPI:  74 yo female with colon cancer undergoing chemotherapy with FOLFOX, most recent treatment on 06/28/14, admitted 07/10/14 with vomiting and diarrhea; CT of the abdomen done 07/12/14 concerning for SBO. [facility] consulted, no need for urgent surgical intervention, but may require a palliative ileostomy for which she would be high risk.  They recommend to consider TPN given severe PCMN and high likelihood of it not improving anytime soon.  Significant events:  7/3: Pharmacy consulted to begin TPN. Patient has implanted port for chemotherapy which can be used for TPN. Because of CKD, will need to limit protein to ~1 gm/kg/day.  Today 07/15/2014:   Glucose:   Electrolytes:  Renal: CKD III  LFTs:  TGs: check in AM  Prealbumin: 6.2 (7/2)  NUTRITIONAL GOALS                                                                                             RD recs: consult pending Clinimix E 5/20 at a goal rate of 91ml/hr + 20% fat emulsion at 16ml/hr to provide: 60g/day protein, 1536Kcal/day.  PLAN                                                                                                                         At 1800 today:  Start Clinimix E 5/20 at 30 ml/hr.  20% fat emulsion at 5 ml/hr.  Plan to advance as tolerated to the goal rate.  TPN to contain standard multivitamins and trace elements.  Reduce IVF to 35 ml/hr.  Check CBGs TID before meals; add SSI if needed.   TPN lab panels on Mondays & Thursdays.  F/u  daily.  F/u RD recommendations and adjust formula/rate as needed.  Peggyann Juba, PharmD, BCPS Pager: [phone] 07/15/2014,2:59 PM   Addendum: Baseline labs reviewed Phos < 1.0 Mag = 1.5 SCr = 1.8  Plan: - Magnesium sulfate 2g IV x 1 - Sodium phosphate 30 mmol IV x 1  Peggyann Juba, PharmD, BCPS 07/15/2014 6:53 PM

## 2014-07-15 NOTE — Progress Notes (Addendum)
Progress Note   Natalie Chapman VZD:[id number] DOB: 1940-11-11 DOA: 07/10/2014 PCP: Reymundo Poll, MD   Brief Narrative:   Natalie Chapman is an 74 y.o. female with past medical history colon cancer undergoing chemotherapy last chemotherapy 6/16, chronic kidney disease stage III, history of right-sided hydronephrosis who underwent ureteral stent placement in 08/03/2014. Presented to the hospital complaining of vomiting and diarrhea. C. difficile negative. CT of the abdomen done 07/12/14 concerning for SBO.  Assessment/Plan:   Principal Problem:   Small bowel obstruction - Status post CT scan 07/12/14 showing findings concerning for distal small bowel obstruction. - Seen by surgery 07/13/14.  No plans for surgical intervention. - May need a palliative G-tube or diverting loop ileostomy if she remains symptomatic. - Have asked palliative care to see for goals of care/symptom management. - Given inability to tolerate any oral nutrition, will order TPN.  Active Problems:   Colon cancer- pT4a, pN2b, MX, ascending colon s/p lap assisted right colectomy 07/20/13 - Oncologist following. CT scan findings concerning for possible recurrence versus scar tissue at operative site. - Last chemotherapy with FOLFOX given 06/28/14.    Atrial fibrillation with rapid ventricular response - Heart rate 96-105. - Metoprolol currently on hold given SBO. Continue IV metoprolol 2.5 mg every 6 hours to address rate control. - Continue aspirin. - No anticoagulation secondary to history of GI bleeding.    Anti-neoplastic chemotherapy induced pancytopenia / neutropenia - Monitor counts. - Platelet count back to normal limits. - Remains neutropenic and anemic. - Transfuse PRBCs for hemoglobin less than 7. No current indication for transfusion. S/P 2 units of PRBCs on 06/28/14.    Morbid obesity  - Status post gastric banding surgery.    Dehydration - Continue IV fluids.    Right hydronephrosis - Status  post ureteral stent 04/27/14.    Elevated bilirubin - No mention of ductal dilatation on CT scan of abdomen done 07/12/14. Other LFTs WNL.    Right middle lobe opacity noted on CT done 07/12/14 - No clinical evidence of pneumonia, but low threshold to start antibiotics if she becomes febrile. - Afebrile, WBC 3.6. Defer antibiotics for now.    Diarrhea - C. difficile studies negative. Follow-up GI pathogen panel also negative.    Chronic kidney disease (CKD), stage III (moderate) - Baseline creatinine 2.2, current creatinine consistent with usual baseline values.    Intertriginous dermatitis left breast/skin tear intergluteal fold - Seen by wound care nurse 07/13/14. Has intertriginous dermatitis beneath left breast and a skin tear on her buttock cleft. - Wound care per recommendations.    DVT Prophylaxis - Lovenox.  Family Communication: Sister updated at the bedside. Disposition Plan: Doubt she can go home with her current symptoms & lack of nutritional support. Code Status:     Code Status Orders        Start     Ordered   07/10/14 1558  Full code   Continuous     07/10/14 1557        IV Access:    Port-A-Cath   Procedures and diagnostic studies:   Ct Abdomen Pelvis Wo Contrast  07/12/2014   CLINICAL DATA:  Acute generalized abdominal pain.  EXAM: CT ABDOMEN AND PELVIS WITHOUT CONTRAST  TECHNIQUE: Multidetector CT imaging of the abdomen and pelvis was performed following the standard protocol without IV contrast.  COMPARISON:  CT scan of April 05, 2014.  FINDINGS: Severe multilevel degenerative disc disease is noted in the lumbar spine. Minimal  right pleural effusion is noted. Right middle lobe airspace opacity is noted concerning for pneumonia.  Status post cholecystectomy. Mild splenomegaly is noted. Visualized portion of pancreas appears normal. Atherosclerosis of abdominal aorta is noted without aneurysm formation. Left adrenal gland is not well visualized. Right  adrenal gland appears normal. Moderate right hydronephrosis is noted. Ureteral stent is seen extending from inferior portion of right renal pelvis to urinary bladder. Prominent left extrarenal pelvis is noted without ureteral dilatation. There is interval development of small bowel dilatation concerning for distal obstruction. Status post right colectomy. Stable soft tissue density is seen in surgical site in right lower quadrant of anterior abdominal wall, which appears to extend into the peritoneal space in the right lower quadrant. No abnormal fluid collection is noted. Urinary bladder is decompressed. Status post hysterectomy.  IMPRESSION: Right middle lobe airspace opacity is noted concerning for pneumonia.  Mild splenomegaly is noted.  Status post cholecystectomy, hysterectomy and right colectomy. Stable soft tissue density is seen in the region of surgical site in the right lower quadrant of the anterior abdominal wall which extends into peritoneal space in the right lower quadrant around presumed position of ileocolic anastomosis. While this may simply represent postoperative scarring, recurrent or metastatic malignancy cannot be excluded.  Interval development of small bowel dilatation concerning for distal small bowel obstruction.  Interval placement of right-sided ureteral stent seen extending from right renal pelvis to urinary bladder.   Electronically Signed   By: Marijo Conception, M.D.   On: 07/12/2014 17:37   Dg Abd 2 Views  07/12/2014   CLINICAL DATA:  Nausea and vomiting, metastatic colon cancer  EXAM: ABDOMEN - 2 VIEW  COMPARISON:  07/10/2014  FINDINGS: Left upper quadrant clips are noted. Right-sided Port-A-Cath in place with tip over the distal SVC. Patchy bibasilar airspace opacities are noted but not further characterized on this nondedicated exam. Several differential air-fluid levels are identified over the left upper quadrant with probable mild small bowel dilatation over the left mid  abdomen measuring 3.6 cm maximally. Severe left and mild right hip degenerative change. Moderate disc degenerative change in the lumbar spine. Right-sided nephro ureteral stent in place. No free air.  IMPRESSION: Moderately dilated left mid abdominal small bowel loops with differential air-fluid levels suggesting small bowel obstruction.   Electronically Signed   By: Conchita Paris M.D.   On: 07/12/2014 10:38   Dg Abd 2 Views  07/10/2014   CLINICAL DATA:  Nausea and vomiting and abdominal pain. Colon cancer.  EXAM: ABDOMEN - 2 VIEW  COMPARISON:  PET-CT dated 05/02/2014  FINDINGS: Right ureteral stent in place. No free air. There is air in the nondistended distal colon. The patient has had resection of the ascending colon. There is a prominent stool ball in the rectum. There is increased density in the right side of the abdomen which is created by the liver which is pushed inferiorly by the COPD.  There is increased density in the left lower quadrant which could represent distended stool filled colon.  Severe arthritis of the left hip. Diffuse degenerative changes in the lumbar spine. Multiple surgical clips in the left upper quadrant from previous gastric surgery.  IMPRESSION: Prominent stool in the distal colon.  No free air.   Electronically Signed   By: Lorriane Shire M.D.   On: 07/10/2014 19:02     Medical Consultants:    Oncology  Anti-Infectives:    None.  Subjective:   Natalie Chapman has not had any  further stools.  Still vomits yellow emesis after just sips of liquids.  No abdominal pain.  No dyspnea or cough.  Objective:    Filed Vitals:   07/14/14 1235 07/14/14 1304 07/14/14 2247 07/15/14 0500  BP: 112/64 108/54 94/63 120/59  Pulse: 96 100 105 99  Temp:  97.5 F (36.4 C) 97.5 F (36.4 C) 97.6 F (36.4 C)  TempSrc:  Oral Oral Oral  Resp:  18 16 16   Height:      Weight:      SpO2:  100% 99% 100%    Intake/Output Summary (Last 24 hours) at 07/15/14 0736 Last data  filed at 07/15/14 0300  Gross per 24 hour  Intake    970 ml  Output    875 ml  Net     95 ml    Exam: Gen:  NAD, comfortable Cardiovascular:  HSIR, No M/R/G Respiratory:  Lungs CTAB, diminished in the bases Gastrointestinal:  Abdomen soft, NT/ND, + BS Extremities:  3+ edema with anterior shin erythema   Data Reviewed:    Labs: Basic Metabolic Panel:  Recent Labs Lab 07/10/14 1321 07/10/14 1725 07/11/14 0455  NA 138  --  139  K 3.3*  --  3.6  CL  --   --  110  CO2 20*  --  21*  GLUCOSE 140  --  123*  BUN 34.1*  --  41*  CREATININE 2.2* 2.24* 2.26*  CALCIUM 8.8  --  7.8*   GFR Estimated Creatinine Clearance: 24.2 mL/min (by C-G formula based on Cr of 2.26). Liver Function Tests:  Recent Labs Lab 07/10/14 1321  AST 12  ALT <6  ALKPHOS 73  BILITOT 1.21*  PROT 6.8  ALBUMIN 3.2*    Recent Labs Lab 07/10/14 1725  LIPASE 15*   CBC:  Recent Labs Lab 07/10/14 1321 07/10/14 1725 07/11/14 0455 07/12/14 0600 07/14/14 0452  WBC 2.2* 2.1* 1.7* 1.4* 3.6*  NEUTROABS 1.4*  --  0.9* 0.6* 2.4  HGB 9.0* 8.1* 7.0* 7.7* 7.8*  HCT 26.8* 23.7* 20.7* 22.6* 23.5*  MCV 93.9 93.7 93.7 93.4 94.4  PLT 148 131* 120* 176 220   Sepsis Labs:  Recent Labs Lab 07/10/14 1725 07/11/14 0455 07/12/14 0600 07/13/14 0640 07/14/14 0452  WBC 2.1* 1.7* 1.4*  --  3.6*  LATICACIDVEN  --   --   --  1.0  --    Microbiology Recent Results (from the past 240 hour(s))  Clostridium Difficile by PCR (not at [facility])     Status: None   Collection Time: 07/11/14  3:26 AM  Result Value Ref Range Status   C difficile by pcr NEGATIVE NEGATIVE Final     Medications:   . aspirin EC  81 mg Oral Daily  . enoxaparin (LOVENOX) injection  30 mg Subcutaneous Q24H  . lactose free nutrition  237 mL Oral TID WC  . metoprolol  2.5 mg Intravenous 4 times per day   Continuous Infusions: . dextrose 5 % and 0.45 % NaCl with KCl 20 mEq/L 75 mL/hr at 07/15/14 0517    Time spent: 25 minutes.    LOS: 5 days   [facility] Pager [phone]. If unable to reach me by pager, please call my cell phone at [phone].  *Please refer to [email or web address], password [email or web address] to get updated schedule on who will round on this patient, as hospitalists switch teams weekly. If 7PM-7AM, please contact night-coverage at [email or web address], password [email or web address] for any overnight needs.  07/15/2014, 7:36 AM

## 2014-07-15 NOTE — Progress Notes (Signed)
CRITICAL VALUE ALERT  Critical value received:  Phosphorus less than 1  Date of notification:  07/15/2014   Time of notification:  6:24 PM   Critical value read back:Yes.    Nurse who received alert:  Joaquin Courts   MD notified (1st page):  Pharmacist  Time of first page:  6:24 PM   MD notified (2nd page):  Time of second page:  Responding MD:  Coralyn Mark Pharmacist  Time MD responded:  6:24 PM

## 2014-07-16 DIAGNOSIS — C182 Malignant neoplasm of ascending colon: Principal | ICD-10-CM

## 2014-07-16 DIAGNOSIS — K56609 Unspecified intestinal obstruction, unspecified as to partial versus complete obstruction: Secondary | ICD-10-CM | POA: Insufficient documentation

## 2014-07-16 DIAGNOSIS — C7989 Secondary malignant neoplasm of other specified sites: Secondary | ICD-10-CM

## 2014-07-16 DIAGNOSIS — C778 Secondary and unspecified malignant neoplasm of lymph nodes of multiple regions: Secondary | ICD-10-CM

## 2014-07-16 DIAGNOSIS — E440 Moderate protein-calorie malnutrition: Secondary | ICD-10-CM | POA: Insufficient documentation

## 2014-07-16 LAB — PREALBUMIN: Prealbumin: 6.1 mg/dL — ABNORMAL LOW (ref 18–38)

## 2014-07-16 LAB — DIFFERENTIAL
BASOS ABS: 0 10*3/uL (ref 0.0–0.1)
Basophils Relative: 0 % (ref 0–1)
Eosinophils Absolute: 0.1 10*3/uL (ref 0.0–0.7)
Eosinophils Relative: 1 % (ref 0–5)
Lymphocytes Relative: 8 % — ABNORMAL LOW (ref 12–46)
Lymphs Abs: 0.5 10*3/uL — ABNORMAL LOW (ref 0.7–4.0)
MONO ABS: 0.7 10*3/uL (ref 0.1–1.0)
Monocytes Relative: 11 % (ref 3–12)
NEUTROS PCT: 80 % — AB (ref 43–77)
Neutro Abs: 5.2 10*3/uL (ref 1.7–7.7)

## 2014-07-16 LAB — COMPREHENSIVE METABOLIC PANEL
ALT: 8 U/L — ABNORMAL LOW (ref 14–54)
ANION GAP: 10 (ref 5–15)
AST: 11 U/L — AB (ref 15–41)
Albumin: 2.7 g/dL — ABNORMAL LOW (ref 3.5–5.0)
Alkaline Phosphatase: 77 U/L (ref 38–126)
BILIRUBIN TOTAL: 0.5 mg/dL (ref 0.3–1.2)
BUN: 36 mg/dL — AB (ref 6–20)
CALCIUM: 8.2 mg/dL — AB (ref 8.9–10.3)
CO2: 22 mmol/L (ref 22–32)
Chloride: 107 mmol/L (ref 101–111)
Creatinine, Ser: 1.92 mg/dL — ABNORMAL HIGH (ref 0.44–1.00)
GFR calc non Af Amer: 25 mL/min — ABNORMAL LOW (ref >60)
GFR, EST AFRICAN AMERICAN: 29 mL/min — AB (ref >60)
Glucose, Bld: 134 mg/dL — ABNORMAL HIGH (ref 65–99)
POTASSIUM: 4.3 mmol/L (ref 3.5–5.1)
SODIUM: 139 mmol/L (ref 135–145)
Total Protein: 5.9 g/dL — ABNORMAL LOW (ref 6.5–8.1)

## 2014-07-16 LAB — GLUCOSE, CAPILLARY
GLUCOSE-CAPILLARY: 120 mg/dL — AB (ref 65–99)
GLUCOSE-CAPILLARY: 127 mg/dL — AB (ref 65–99)
Glucose-Capillary: 128 mg/dL — ABNORMAL HIGH (ref 65–99)

## 2014-07-16 LAB — PHOSPHORUS: Phosphorus: 3 mg/dL (ref 2.5–4.6)

## 2014-07-16 LAB — TRIGLYCERIDES: Triglycerides: 83 mg/dL (ref <150)

## 2014-07-16 LAB — CBC
HCT: 24.9 % — ABNORMAL LOW (ref 36.0–46.0)
Hemoglobin: 8.2 g/dL — ABNORMAL LOW (ref 12.0–15.0)
MCH: 31.4 pg (ref 26.0–34.0)
MCHC: 32.9 g/dL (ref 30.0–36.0)
MCV: 95.4 fL (ref 78.0–100.0)
Platelets: 240 10*3/uL (ref 150–400)
RBC: 2.61 MIL/uL — AB (ref 3.87–5.11)
RDW: 20.2 % — ABNORMAL HIGH (ref 11.5–15.5)
WBC: 6.5 10*3/uL (ref 4.0–10.5)

## 2014-07-16 LAB — MAGNESIUM: Magnesium: 1.9 mg/dL (ref 1.7–2.4)

## 2014-07-16 MED ORDER — TRACE MINERALS CR-CU-MN-SE-ZN 10-1000-500-60 MCG/ML IV SOLN
INTRAVENOUS | Status: AC
  Administered 2014-07-16: 18:00:00 via INTRAVENOUS
  Filled 2014-07-16 (×2): qty 1000

## 2014-07-16 MED ORDER — FAT EMULSION 20 % IV EMUL
144.0000 mL | INTRAVENOUS | Status: DC
  Filled 2014-07-16: qty 250

## 2014-07-16 MED ORDER — FAT EMULSION 20 % IV EMUL
168.0000 mL | INTRAVENOUS | Status: DC
  Filled 2014-07-16: qty 200

## 2014-07-16 MED ORDER — INSULIN ASPART 100 UNIT/ML ~~LOC~~ SOLN
0.0000 [IU] | Freq: Four times a day (QID) | SUBCUTANEOUS | Status: DC
  Administered 2014-07-17 (×2): 1 [IU] via SUBCUTANEOUS

## 2014-07-16 MED ORDER — FAT EMULSION 20 % IV EMUL: 240.0000 mL | INTRAVENOUS | Status: DC

## 2014-07-16 MED ORDER — TRACE MINERALS CR-CU-MN-SE-ZN 10-1000-500-60 MCG/ML IV SOLN: INTRAVENOUS | Status: DC

## 2014-07-16 MED ORDER — FAT EMULSION 20 % IV EMUL
168.0000 mL | INTRAVENOUS | Status: AC
  Administered 2014-07-16: 7 mL via INTRAVENOUS
  Filled 2014-07-16: qty 250

## 2014-07-16 MED ORDER — TRACE MINERALS CR-CU-MN-SE-ZN 10-1000-500-60 MCG/ML IV SOLN
INTRAVENOUS | Status: DC
  Filled 2014-07-16 (×2): qty 1000

## 2014-07-16 MED ORDER — MAGNESIUM SULFATE IN D5W 10-5 MG/ML-% IV SOLN
1.0000 g | Freq: Once | INTRAVENOUS | Status: DC
  Filled 2014-07-16: qty 100

## 2014-07-16 NOTE — Progress Notes (Signed)
Daily Progress Note   Patient Name: Natalie Chapman       Date: 07/16/2014 DOB: Jun 02, 1940  Age: 74 y.o. MRN#: [id number] Attending Physician: Venetia Maxon Rama, MD Primary Care Physician: Reymundo Poll, MD Admit Date: 07/10/2014  Reason for Consultation/Follow-up: Establishing goals of care Life limiting illness: Colon cancer, now admitted for bowel obstruction.  Subjective:  Awake alert sitting in a chair. Family at bedside. NG tube draining dark bilious material. Patient currently denies any nausea or pain  Interval Events:  Discussed with son Nicki Reaper present at the bedside. Patient has been seen by Dr. Benay Spice earlier today. Patient and family considering decompressive gastrostomy tube placement as a palliative measure. They would like for the patient to go to SNF if possible. Surgery to follow up with them in am.  Otherwise continue current treatment.  Length of Stay: 6 days  Current Medications: Scheduled Meds:  . aspirin EC  81 mg Oral Daily  . enoxaparin (LOVENOX) injection  30 mg Subcutaneous Q24H  . lactose free nutrition  237 mL Oral TID WC  . metoprolol  2.5 mg Intravenous 4 times per day    Continuous Infusions: . dextrose 5 % and 0.45 % NaCl with KCl 20 mEq/L 35 mL/hr at 07/15/14 1702  . Marland KitchenTPN (CLINIMIX-E) Adult 30 mL/hr at 07/15/14 1859   And  . fat emulsion 240 mL (07/15/14 1859)    PRN Meds: acetaminophen **OR** acetaminophen, albuterol, labetalol, lidocaine-prilocaine, morphine injection, ondansetron (ZOFRAN) IV, phenol  Palliative Performance Scale: 30%     Vital Signs: BP 123/65 mmHg  Pulse 93  Temp(Src) 97.6 F (36.4 C) (Oral)  Resp 20  Ht 5' (1.524 m)  Wt 107.502 kg (237 lb)  BMI 46.29 kg/m2  SpO2 100% SpO2: SpO2: 100 % O2 Device: O2 Device: Not Delivered O2 Flow Rate:    Intake/output summary:  Intake/Output Summary (Last 24 hours) at 07/16/14 1303 Last data filed at 07/16/14 0947  Gross per 24 hour  Intake    918 ml  Output   2700 ml    Net  -1782 ml   LBM:   Baseline Weight: Weight: 107.502 kg (237 lb) Most recent weight: Weight: 107.502 kg (237 lb)  Physical Exam:    Elderly lady sitting up in a chair. NG tube pain. Chest clear Cardiac regular Abdomen slight distention Extremities trace edema Nonfocal Mood congruent   Additional Data Reviewed: Recent Labs     07/14/14  0452  07/15/14  1723  07/16/14  0435  WBC  3.6*   --   6.5  HGB  7.8*   --   8.2*  PLT  220   --   240  NA   --   138  139  BUN   --   35*  36*  CREATININE   --   1.80*  1.92*     Problem List:  Patient Active Problem List   Diagnosis Date Noted  . Hypophosphatemia 07/16/2014  . Hypomagnesemia 07/16/2014  . Malnutrition of moderate degree 07/16/2014  . Pressure ulcer 07/15/2014  . Encounter for palliative care   . Tear of skin of buttock 07/14/2014  . Intertriginous dermatitis 07/14/2014  . Small bowel obstruction 07/13/2014  . Antineoplastic chemotherapy induced pancytopenia 07/13/2014  . Elevated bilirubin 07/13/2014  . Atrial fibrillation with RVR 07/13/2014  . Neutropenia 07/13/2014  . Hydronephrosis, right 07/13/2014  . Nausea & vomiting   . Dehydration 07/10/2014  . Diarrhea 07/10/2014  . Chronic kidney disease (CKD), stage  III (moderate) 07/10/2014  . Colon cancer- pT4a, pN2b, MX, ascending colon s/p lap assisted right colectomy 07/20/13 05/12/2013  . Atrial fibrillation 05/12/2013  . Morbid obesity 05/12/2013  . Anemia 05/12/2013  . 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 Evaluate appropriateness of venting gastrostomy placement.  Desire for further Chaplaincy support:no  3. Symptom Management:   Continue to monitor.  4. Palliative Prophylaxis:  Stool Softener: Patient with bowel obstruction.  5. Prognosis: < 6 months  5. Discharge Planning: To be  determined based on clinical course. She may need transition to skilled nursing facility.   Care plan was discussed with patient, son, sister, Dr.Rama  Thank you for allowing the Palliative Medicine Team to assist in the care of this patient.   Time In: 1200 Time Out: 1225 Total Time 25 min Prolonged Time Billed  no     Greater than 50%  of this time was spent counseling and coordinating care related to the above assessment and plan.   Loistine Chance, MD  07/16/2014, 1:03 PM  Please contact Palliative Medicine Team phone at [phone] for questions and concerns.  [phone]

## 2014-07-16 NOTE — Progress Notes (Signed)
Utilization review completed.  

## 2014-07-16 NOTE — Progress Notes (Signed)
Progress Note   Natalie Chapman:[id number] DOB: Apr 06, 1940 DOA: 07/10/2014 PCP: Reymundo Poll, MD   Brief Narrative:   Natalie Chapman is an 74 y.o. female with past medical history colon cancer undergoing chemotherapy last chemotherapy 6/16, chronic kidney disease stage III, history of right-sided hydronephrosis who underwent ureteral stent placement in 08/03/2014. Presented to the hospital complaining of vomiting and diarrhea. C. difficile negative. CT of the abdomen done 07/12/14 concerning for SBO. Surgery is following the patient in consultation but no plans for operative management at the present time. Palliative care consultation called for goals of care/symptom management. TNA started 07/15/14 for nutritional support giving ongoing intolerance of oral intake.  Assessment/Plan:   Principal Problem:   Small bowel obstruction - Status post CT scan 07/12/14 showing findings concerning for distal small bowel obstruction. - Surgery following. - She will need a palliative G-tube or diverting loop ileostomy given intractable nausea/vomiting. - NG tube placed 07/15/14, continue nasogastric suctioning. - TPN started 07/15/14.  Active Problems:   Hypophosphatemia / hypomagnesemia - Monitor electrolytes closely. High risk for refeeding syndrome. Now on TNA. - Magnesium and phosphorus WNL this morning.    Colon cancer- pT4a, pN2b, MX, ascending colon s/p lap assisted right colectomy 07/20/13 - Oncologist following. CT scan findings concerning for possible recurrence versus scar tissue at operative site. - Last chemotherapy with FOLFOX given 06/28/14. - Palliative care following for goals of care/symptom management.    Atrial fibrillation with rapid ventricular response - Heart rate 82-117. - Metoprolol currently on hold given SBO. Continue IV metoprolol 2.5 mg every 6 hours to address rate control. - Continue aspirin. - No anticoagulation secondary to history of GI bleeding.     Anti-neoplastic chemotherapy induced pancytopenia / neutropenia - Monitor counts. - Platelet count back to normal limits. - Neutropenia resolved 07/14/14. Hemoglobin stable at 8.2 mg/dL. - Transfuse PRBCs for hemoglobin less than 7. No current indication for transfusion. S/P 2 units of PRBCs on 06/28/14.    Morbid obesity  - Status post gastric banding surgery.    Dehydration - Continue IV fluids.    Right hydronephrosis - Status post ureteral stent 04/27/14.    Elevated bilirubin - No mention of ductal dilatation on CT scan of abdomen done 07/12/14. Other LFTs WNL.    Right middle lobe opacity noted on CT done 07/12/14 - No clinical evidence of pneumonia, but low threshold to start antibiotics if she becomes febrile. - Afebrile, WBC 6.5. Defer antibiotics for now.    Diarrhea - C. difficile studies negative. Follow-up GI pathogen panel also negative.    Chronic kidney disease (CKD), stage III (moderate) - Baseline creatinine 2.2, current creatinine improved over usual baseline values.    Intertriginous dermatitis left breast/skin tear intergluteal fold - Seen by wound care nurse 07/13/14. Has intertriginous dermatitis beneath left breast and a skin tear on her buttock cleft. - Wound care per recommendations.    DVT Prophylaxis - Lovenox.  Family Communication: Son and 2 sisters updated at the bedside. Disposition Plan: Doubt she can go home with her current symptoms & lack of nutritional support, will need SNF versus home with hospice when medically stable and final plan for long-term management of malignant obstruction outlined. Code Status:     Code Status Orders        Start     Ordered   07/10/14 1558  Full code   Continuous     07/10/14 1557  IV Access:    Port-A-Cath   Procedures and diagnostic studies:   Ct Abdomen Pelvis Wo Contrast  07/12/2014   CLINICAL DATA:  Acute generalized abdominal pain.  EXAM: CT ABDOMEN AND PELVIS WITHOUT CONTRAST   TECHNIQUE: Multidetector CT imaging of the abdomen and pelvis was performed following the standard protocol without IV contrast.  COMPARISON:  CT scan of April 05, 2014.  FINDINGS: Severe multilevel degenerative disc disease is noted in the lumbar spine. Minimal right pleural effusion is noted. Right middle lobe airspace opacity is noted concerning for pneumonia.  Status post cholecystectomy. Mild splenomegaly is noted. Visualized portion of pancreas appears normal. Atherosclerosis of abdominal aorta is noted without aneurysm formation. Left adrenal gland is not well visualized. Right adrenal gland appears normal. Moderate right hydronephrosis is noted. Ureteral stent is seen extending from inferior portion of right renal pelvis to urinary bladder. Prominent left extrarenal pelvis is noted without ureteral dilatation. There is interval development of small bowel dilatation concerning for distal obstruction. Status post right colectomy. Stable soft tissue density is seen in surgical site in right lower quadrant of anterior abdominal wall, which appears to extend into the peritoneal space in the right lower quadrant. No abnormal fluid collection is noted. Urinary bladder is decompressed. Status post hysterectomy.  IMPRESSION: Right middle lobe airspace opacity is noted concerning for pneumonia.  Mild splenomegaly is noted.  Status post cholecystectomy, hysterectomy and right colectomy. Stable soft tissue density is seen in the region of surgical site in the right lower quadrant of the anterior abdominal wall which extends into peritoneal space in the right lower quadrant around presumed position of ileocolic anastomosis. While this may simply represent postoperative scarring, recurrent or metastatic malignancy cannot be excluded.  Interval development of small bowel dilatation concerning for distal small bowel obstruction.  Interval placement of right-sided ureteral stent seen extending from right renal pelvis to  urinary bladder.   Electronically Signed   By: Marijo Conception, M.D.   On: 07/12/2014 17:37   Dg Abd 2 Views  07/12/2014   CLINICAL DATA:  Nausea and vomiting, metastatic colon cancer  EXAM: ABDOMEN - 2 VIEW  COMPARISON:  07/10/2014  FINDINGS: Left upper quadrant clips are noted. Right-sided Port-A-Cath in place with tip over the distal SVC. Patchy bibasilar airspace opacities are noted but not further characterized on this nondedicated exam. Several differential air-fluid levels are identified over the left upper quadrant with probable mild small bowel dilatation over the left mid abdomen measuring 3.6 cm maximally. Severe left and mild right hip degenerative change. Moderate disc degenerative change in the lumbar spine. Right-sided nephro ureteral stent in place. No free air.  IMPRESSION: Moderately dilated left mid abdominal small bowel loops with differential air-fluid levels suggesting small bowel obstruction.   Electronically Signed   By: Conchita Paris M.D.   On: 07/12/2014 10:38   Dg Abd 2 Views  07/10/2014   CLINICAL DATA:  Nausea and vomiting and abdominal pain. Colon cancer.  EXAM: ABDOMEN - 2 VIEW  COMPARISON:  PET-CT dated 05/02/2014  FINDINGS: Right ureteral stent in place. No free air. There is air in the nondistended distal colon. The patient has had resection of the ascending colon. There is a prominent stool ball in the rectum. There is increased density in the right side of the abdomen which is created by the liver which is pushed inferiorly by the COPD.  There is increased density in the left lower quadrant which could represent distended stool filled colon.  Severe arthritis of the left hip. Diffuse degenerative changes in the lumbar spine. Multiple surgical clips in the left upper quadrant from previous gastric surgery.  IMPRESSION: Prominent stool in the distal colon.  No free air.   Electronically Signed   By: Lorriane Shire M.D.   On: 07/10/2014 19:02     Medical Consultants:     Oncology  Palliative Care  Surgery  Anti-Infectives:    None.  Subjective:   Natalie Chapman has not had any further stools.  Feels much better with placement of the NG tube. Somewhat tearful over coming to terms with her diagnosis and prognosis. No current complaints of pain.  Objective:    Filed Vitals:   07/15/14 1331 07/15/14 1702 07/15/14 2038 07/16/14 0540  BP: 117/51 129/66 103/65 119/76  Pulse: 112 117 82 95  Temp: 97.9 F (36.6 C)  98.1 F (36.7 C) 97.6 F (36.4 C)  TempSrc: Oral  Oral Oral  Resp: 16  18 20   Height:      Weight:      SpO2: 100%  100% 100%    Intake/Output Summary (Last 24 hours) at 07/16/14 0737 Last data filed at 07/16/14 0602  Gross per 24 hour  Intake    918 ml  Output   2775 ml  Net  -1857 ml    Exam: Gen:  NAD, comfortable HEENT: NG tube in place draining bilious material Cardiovascular:  HSIR, No M/R/G Respiratory:  Lungs CTAB, diminished in the bases Gastrointestinal:  Abdomen soft, NT/ND, + BS Extremities:  3+ edema with anterior shin erythema   Data Reviewed:    Labs: Basic Metabolic Panel:  Recent Labs Lab 07/10/14 1321 07/10/14 1725  07/11/14 0455 07/15/14 1723 07/16/14 0435  NA 138  --   --  139 138 139  K 3.3*  --   < > 3.6 4.4 4.3  CL  --   --   --  110 108 107  CO2 20*  --   --  21* 22 22  GLUCOSE 140  --   --  123* 108* 134*  BUN 34.1*  --   --  41* 35* 36*  CREATININE 2.2* 2.24*  --  2.26* 1.80* 1.92*  CALCIUM 8.8  --   --  7.8* 8.5* 8.2*  MG  --   --   --   --  1.5* 1.9  PHOS  --   --   --   --  <1.0* 3.0  < > = values in this interval not displayed. GFR Estimated Creatinine Clearance: 28.5 mL/min (by C-G formula based on Cr of 1.92). Liver Function Tests:  Recent Labs Lab 07/10/14 1321 07/15/14 1723 07/16/14 0435  AST 12 13* 11*  ALT <6 10* 8*  ALKPHOS 73 80 77  BILITOT 1.21* 1.0 0.5  PROT 6.8 6.7 5.9*  ALBUMIN 3.2* 3.0* 2.7*    Recent Labs Lab 07/10/14 1725  LIPASE 15*    CBC:  Recent Labs Lab 07/10/14 1321 07/10/14 1725 07/11/14 0455 07/12/14 0600 07/14/14 0452 07/16/14 0435  WBC 2.2* 2.1* 1.7* 1.4* 3.6* 6.5  NEUTROABS 1.4*  --  0.9* 0.6* 2.4 5.2  HGB 9.0* 8.1* 7.0* 7.7* 7.8* 8.2*  HCT 26.8* 23.7* 20.7* 22.6* 23.5* 24.9*  MCV 93.9 93.7 93.7 93.4 94.4 95.4  PLT 148 131* 120* 176 220 240   Sepsis Labs:  Recent Labs Lab 07/11/14 0455 07/12/14 0600 07/13/14 0640 07/14/14 0452 07/16/14 0435  WBC 1.7* 1.4*  --  3.6*  6.5  LATICACIDVEN  --   --  1.0  --   --    Microbiology Recent Results (from the past 240 hour(s))  Clostridium Difficile by PCR (not at [facility])     Status: None   Collection Time: 07/11/14  3:26 AM  Result Value Ref Range Status   C difficile by pcr NEGATIVE NEGATIVE Final     Medications:   . aspirin EC  81 mg Oral Daily  . enoxaparin (LOVENOX) injection  30 mg Subcutaneous Q24H  . lactose free nutrition  237 mL Oral TID WC  . metoprolol  2.5 mg Intravenous 4 times per day   Continuous Infusions: . dextrose 5 % and 0.45 % NaCl with KCl 20 mEq/L 35 mL/hr at 07/15/14 1702  . Marland KitchenTPN (CLINIMIX-E) Adult 30 mL/hr at 07/15/14 1859   And  . fat emulsion 240 mL (07/15/14 1859)    Time spent: 35 minutes with greater than 50% of the time counseling the patient and her family about options for long-term management of SBO and her overall prognosis.   LOS: 6 days   [place] [facility] Pager [phone]. If unable to reach me by pager, please call my cell phone at [phone].  *Please refer to [email or web address], password [email or web address] to get updated schedule on who will round on this patient, as hospitalists switch teams weekly. If 7PM-7AM, please contact night-coverage at [email or web address], password [email or web address] for any overnight needs.  07/16/2014, 7:37 AM

## 2014-07-16 NOTE — Progress Notes (Signed)
Initial Nutrition Assessment  DOCUMENTATION CODES:  Non-severe (moderate) malnutrition in context of chronic illness, Morbid obesity  INTERVENTION: - TPN per pharmacy; recommend goal rate of 75 mL/hr - If feasible, diet advancement as tolerated - RD will continue to monitor for needs  NUTRITION DIAGNOSIS:  Increased nutrient needs related to cancer and cancer related treatments as evidenced by estimated needs.  GOAL:  Patient will meet greater than or equal to 90% of their needs  MONITOR:  Weight trends, Labs, I & O's, Other (Comment) (TPN)  REASON FOR ASSESSMENT:  Consult New TPN/TNA  ASSESSMENT: 74 y.o. female with a Past Medical History of colon cancer undergoing chemotherapy (last chemotherapy on 6/16), chronic knee disease stage III, history of right-sided hydronephrosis underwent ureteral stent placement (due for change on 08/03/14) who presents today with the above noted complaint. Per patient, for the past 4-5 days she has had slowly worsening nausea vomiting and persistent diarrhea. She denies any fever or abdominal pain.  Pt seen for new TPN. BMI indicates morbid obesity. Pt had NGT to suction placed yesterday evening and ~100cc dark drainage noted at time of visit. Per surgical notes, possible placement of venting G tube as intervention. Per Palliative Care note yesterday: Daughter and son state that they would not want the patient to get more chemotherapy. They are interested in finding out if the gastrostomy will help. They are also not opposed to TNA and NGT.   Per family, pt has not consumed a meal in ~2 weeks and prior to this span she would eat a few bites/meal and state she was full; may be at risk for refeeding. Pt had some Mellow Yellow yesterday and this AM and denies nausea or abdominal pain with intakes.   Daughter indicates that pt has lost 200-215 lbs in the past few years. Per weight hx review, pt's weight has been stable for the past 5 months.   Mild to  moderate muscle wasting to upper body as well as moderate to severe fluid accumulation to feet and ankles noted. Pt currently receiving Clinimix E 5/20 @ 30 mL/hr with 20% lipids @ 10 mL/hr which is providing 1114 kcal, 36 grams of protein; not meeting needs.  Medications reviewed. Labs reviewed; BUN/creatinine elevated, Ca: 8.2 mg/dL, GFR: 25.  Height:  Ht Readings from Last 1 Encounters:  07/10/14 5' (1.524 m)    Weight:  Wt Readings from Last 1 Encounters:  07/10/14 237 lb (107.502 kg)    Ideal Body Weight:  45.45 kg (kg)  Wt Readings from Last 10 Encounters:  07/10/14 237 lb (107.502 kg)  06/28/14 235 lb 11.2 oz (106.913 kg)  06/14/14 237 lb 8 oz (107.729 kg)  05/31/14 233 lb 8 oz (105.915 kg)  05/03/14 230 lb 12.8 oz (104.69 kg)  04/27/14 230 lb (104.327 kg)  04/23/14 230 lb 14.4 oz (104.736 kg)  04/06/14 233 lb 12.8 oz (106.051 kg)  03/23/14 240 lb (108.863 kg)  02/22/14 236 lb 1.6 oz (107.094 kg)    BMI:  Body mass index is 46.29 kg/(m^2).  Estimated Nutritional Needs:  Kcal:  2000-2200  Protein:  85-95 grams  Fluid:  2 L/day  Skin:  Wound (see comment) (Stage 2 sacral pressure ulcer)  Diet Order:  Diet clear liquid Room service appropriate?: Yes; Fluid consistency:: Thin TPN (CLINIMIX-E) Adult  EDUCATION NEEDS:  No education needs identified at this time   Intake/Output Summary (Last 24 hours) at 07/16/14 0901 Last data filed at 07/16/14 0602  Gross per 24 hour  Intake    918 ml  Output   2675 ml  Net  -1757 ml    Last BM:  7/1     Jarome Matin, RD, LDN Inpatient Clinical Dietitian Pager # [phone] After hours/weekend pager # [phone]

## 2014-07-16 NOTE — Progress Notes (Signed)
IP PROGRESS NOTE  Subjective:   Natalie Chapman reports feeling better with the NG tube in place.  Objective: Vital signs in last 24 hours: Blood pressure 119/76, pulse 95, temperature 97.6 F (36.4 C), temperature source Oral, resp. rate 20, height 5' (1.524 m), weight 237 lb (107.502 kg), SpO2 100 %.  Intake/Output from previous day: 07/03 0701 - 07/04 0700 In: 918 [I.V.:918] Out: 2775 [Urine:925; Emesis/NG output:1850]  Physical Exam:  HEENT: NG tube in place Lungs: Clear bilaterally Cardiac: Regular rate and rhythm Abdomen: Firm masslike fullness in the right lower abdomen Extremities: Chronic stasis change at the low leg bilaterally   Portacath/PICC-without erythema  Lab Results:  Recent Labs  07/14/14 0452 07/16/14 0435  WBC 3.6* 6.5  HGB 7.8* 8.2*  HCT 23.5* 24.9*  PLT 220 240    BMET  Recent Labs  07/15/14 1723 07/16/14 0435  NA 138 139  K 4.4 4.3  CL 108 107  CO2 22 22  GLUCOSE 108* 134*  BUN 35* 36*  CREATININE 1.80* 1.92*  CALCIUM 8.5* 8.2*    Studies/Results: No results found.  Medications: I have reviewed the patient's current medications.  Assessment/Plan: 1. 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 8 of 15 lymph nodes contained metastatic carcinoma, tumor deposits were present; microsatellite stable.  PET scan 08/24/2013 with mildly hypermetabolic small left axillary and right inguinal nodes--most likely unrelated to colon cancer, no clear evidence of metastatic disease.   Cycle 1 adjuvant Xeloda 09/05/2013  Cycle 2 adjuvant Xeloda 09/26/2013  Cycle 3 adjuvant Xeloda 10/17/2013  Cycle 4 adjuvant Xeloda 11/07/2013  Cycle 5 adjuvant Xeloda 11/28/2013  Cycle 6 adjuvant Xeloda 12/19/2013  Cycle 7 adjuvant Xeloda 01/09/2014  Cycle 8 adjuvant Xeloda 01/30/2014  Status post biopsy of nodularity at the right lower quadrant scar on  03/27/2014 confirming metastatic colon cancer  CT 04/05/2014 with new right hydronephrosis, soft tissue thickening adjacent to the ileocolonic anastomosis and nodularity in the overlying subcutaneous tissue  PET scan 05/02/2014 with hypermetabolic soft tissue adjacent to the ileocolic anastomosis beneath the right abdominal wall. Suspected soft tissue metastasis in the right abdominal wall, peritoneal disease along the right liver and pelvic nodal metastases. Suspected right pelvic implant. Associated mild right hydronephrosis with indwelling ureteral stent.  Cycle 1 FOLFOX 05/17/2014  Cycle 2 FOLFOX 05/31/2014  Cycle 3 FOLFOX 06/14/2014  Cycle 4 FOLFOX 06/28/2014 2. Atrial fibrillation 3. Anemia secondary to bleeding from the colon cancer,renal insufficiency, and chemotherapy-status post 2 units of packed red blood cells beginning 06/28/2014 4. History of tobacco use with COPD changes on the CT 04/07/2013  5. Morbid obesity  6. Status post gastric banding surgery  7. Renal insufficiency  8. Anorexia-etiology unclear. Improved 9. Right hydronephrosis on the CT 04/05/2014. She was referred to urology and underwent placement of a ureter stent on 04/27/2014. Renal function has improved. 10. Port-A-Cath placement 05/15/2014 11. Nausea/vomiting, diarrhea, and dehydration-the plain x-ray and CT on 07/12/2014 is consistent with a partial small bowel obstruction   NG tube placed 07/15/2014 12. 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 An NG tube is in place and she is now on TPN. I discussed the current situation, prognosis, and treatment options with Natalie Chapman and her family. The bowel obstruction occurred after 4 cycles of FOLFOX. There is a small chance of benefit from salvage chemotherapy. I do not recommend further chemotherapy.  We discussed the  probable need for a decompressive gastrostomy tube. Natalie Chapman and her  family understand it will be difficult for her to live alone going forward. There is currently no family available to care for her in the home. She is not a candidate for [employer].  We discussed skilled nursing facility placement.  Recommendations: 1. Management of NG tube per surgery, consider palliative gastrostomy tube if the bowel obstruction does not resolve 2. Skilled nursing facility placement versus home with Hospice at discharge  I discussed the situation with her son by telephone this morning and will meet with him in the morning on 07/17/2014.    LOS: 6 days   Joiner  07/16/2014, 10:15 AM

## 2014-07-16 NOTE — Progress Notes (Signed)
  Subjective: Ng tube placed yesterday. >1L out. No bm. Palliative care saw yesterday  Objective: Vital signs in last 24 hours: Temp:  [97.6 F (36.4 C)-98.1 F (36.7 C)] 97.6 F (36.4 C) (07/04 0540) Pulse Rate:  [82-117] 95 (07/04 0540) Resp:  [16-20] 20 (07/04 0540) BP: (101-129)/(51-76) 119/76 mmHg (07/04 0540) SpO2:  [100 %] 100 % (07/04 0540) Last BM Date: 07/13/14  Intake/Output from previous day: 07/03 0701 - 07/04 0700 In: 918 [I.V.:918] Out: 2775 [Urine:925; Emesis/NG output:1850] Intake/Output this shift:    Cachetic, obese Soft, nt  Lab Results:   Recent Labs  07/14/14 0452 07/16/14 0435  WBC 3.6* 6.5  HGB 7.8* 8.2*  HCT 23.5* 24.9*  PLT 220 240   BMET  Recent Labs  07/15/14 1723 07/16/14 0435  NA 138 139  K 4.4 4.3  CL 108 107  CO2 22 22  GLUCOSE 108* 134*  BUN 35* 36*  CREATININE 1.80* 1.92*  CALCIUM 8.5* 8.2*   PT/INR No results for input(s): LABPROT, INR in the last 72 hours. ABG No results for input(s): PHART, HCO3 in the last 72 hours.  Invalid input(s): PCO2, PO2  Studies/Results: No results found.  Anti-infectives: Anti-infectives    None      Assessment/Plan: 1. 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 PSBO, N/V/ diarrhea -she will require a palliative operation ileostomy (which could worsen kidney function) and/or placement of a gastrostomy tube. As stated earlier, given her malnutrition and recent chemo, the patient would be at high risk for surgical complications such as wound healing or healing of any type of anastomosis if created.   Cont bowel rest, NG tube. Briefly went over g tube vs ileostomy - pros/cons of each. Family leaning toward g tube but concerned that it wont really help with nutritional state which is true.   Signed out to Dr Zella Richer that pt not tolerating liquids and will need palliative procedure. Dr Zella Richer  to cont discussion about type of procedure. Family/pt have requested him. He is back from holiday Tuesday.  3. Severe protein calorie malnutrition - prealb 6, TPN 4. A fib -not on blood thinners 5. Chronic kidney disease 6. Right sided hydronephrosis with stent  7. Neutropenia   Leighton Ruff. Redmond Pulling, MD, FACS General, Bariatric, & Minimally Invasive Surgery [facility], [place]   LOS: 6 days    Gayland Curry 07/16/2014

## 2014-07-16 NOTE — Progress Notes (Addendum)
PARENTERAL NUTRITION CONSULT NOTE - Follow-Up  Pharmacy Consult for TPN Indication: Small bowel obstruction  No Known Allergies  Patient Measurements: Height: 5' (152.4 cm) Weight: 237 lb (107.502 kg) IBW/kg (Calculated) : 45.5 Adjusted Body Weight: 61 kg  Vital Signs: Temp: 97.6 F (36.4 C) (07/04 0540) Temp Source: Oral (07/04 0540) BP: 119/76 mmHg (07/04 0540) Pulse Rate: 95 (07/04 0540) Intake/Output from previous day: 07/03 0701 - 07/04 0700 In: 918 [I.V.:918] Out: 2775 [Urine:925; Emesis/NG output:1850] Intake/Output from this shift: Total I/O In: -  Out: 250 [Urine:250]  Labs:  Recent Labs  07/14/14 0452 07/16/14 0435  WBC 3.6* 6.5  HGB 7.8* 8.2*  HCT 23.5* 24.9*  PLT 220 240     Recent Labs  07/14/14 0452 07/15/14 1723 07/16/14 0435  NA  --  138 139  K  --  4.4 4.3  CL  --  108 107  CO2  --  22 22  GLUCOSE  --  108* 134*  BUN  --  35* 36*  CREATININE  --  1.80* 1.92*  CALCIUM  --  8.5* 8.2*  MG  --  1.5* 1.9  PHOS  --  <1.0* 3.0  PROT  --  6.7 5.9*  ALBUMIN  --  3.0* 2.7*  AST  --  13* 11*  ALT  --  10* 8*  ALKPHOS  --  80 77  BILITOT  --  1.0 0.5  PREALBUMIN 6.2*  --  6.1*  TRIG  --   --  83   Estimated Creatinine Clearance: 28.5 mL/min (by C-G formula based on Cr of 1.92).   No results for input(s): GLUCAP in the last 72 hours.  Medical History: Past Medical History  Diagnosis Date  . Hypertension   . Anemia   . H/O hiatal hernia   . Chronic atrial fibrillation     CARDIOLOGIST-- DR Johnsie Cancel  . Arthritis     LEFT HIP  . Generalized weakness   . Colon cancer dx mar  2015    Stage IIIc, pT4a  pN2b, moderate differentiated adenocarcinoma right colon with 8of15 +lymph nodes for metastatic carcinoma--  s/p  right colectomy 07-20-2013;  chemotherapy 09-05-2013 to 01-30-2014  . 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 Poor appetite   . Frequency of urination   . Wears glasses   . Full dentures   . Dysrhythmia     AFIB  . Chronic kidney  disease (CKD), stage I   . Hydronephrosis, right   . Seroma, postoperative 07/27/2013    Medications:  Scheduled:  . aspirin EC  81 mg Oral Daily  . enoxaparin (LOVENOX) injection  30 mg Subcutaneous Q24H  . lactose free nutrition  237 mL Oral TID WC  . metoprolol  2.5 mg Intravenous 4 times per day   Infusions:  . dextrose 5 % and 0.45 % NaCl with KCl 20 mEq/L 35 mL/hr at 07/15/14 1702  . Marland KitchenTPN (CLINIMIX-E) Adult 30 mL/hr at 07/15/14 1859   And  . fat emulsion 240 mL (07/15/14 1859)   PRN: acetaminophen **OR** acetaminophen, albuterol, labetalol, lidocaine-prilocaine, morphine injection, ondansetron (ZOFRAN) IV, phenol   Insulin Requirements: none ordered  Current Nutrition:  Clear liquids, Boost Plus TID with meals ordered (not receiving)  IVF: D5 1/2 NS w/20 mEq KCl/L at 35 ml/hr  Central access: implanted port TPN start date: 7/3  ASSESSMENT  HPI:  74 yo female with colon cancer undergoing chemotherapy with FOLFOX, most recent treatment on 06/28/14, admitted 07/10/14 with vomiting and diarrhea; CT of the abdomen done 07/12/14 concerning for SBO. CCS consulted, no need for urgent surgical intervention, but may require a palliative ileostomy for which she would be high risk.  They recommend to consider TPN given severe PCM and high likelihood of it not improving anytime soon.  Significant events:  7/3: Pharmacy consulted to begin TPN. Patient has implanted port for chemotherapy which can be used for TPN.  7/4: NG tube draining dark bilious material; considering palliative gastrostomy tube  Today 07/16/2014:   Glucose: CBG this afternoon 128, no hx of DM  Electrolytes: all now WNL (s/p replacement of Mag, Phos on 7/3)  Renal: CKD III, SCr increased to 1.92 today  LFTs: AST/ALT low, Alk Phos WNL, Tbili WNL at 0.5  TGs: 83  Prealbumin: 6.2 (7/2), 6.1 (7/4)  NUTRITIONAL  GOALS                                                                                             RD recs:  Kcal: 2000-2200, Protein: 85-95 grams Clinimix 5/20 at a goal rate of 80m/hr + 20% fat emulsion at 152mhr to provide: 90g/day protein, 2064 Kcal/day (note these are RD recs)  PLAN                                                                                                                         At 1800 today:  Increase Clinimix E 5/20 to 42 ml/hr.  Increase 20% fat emulsion to 7 ml/hr (to keep lipids <= 30% of total daily calories).  Monitor closely for signs of refeeding syndrome.  Plan to advance as tolerated to the goal rate.  Given CKD, would recommend limiting protein to 1 g/kg/day based on adjusted body weight above. Can consider increasing slowly above this amount to RD recommended goal (~ 1.48 g/kg/day) if patient tolerates.   TPN to contain standard multivitamins and trace elements.  Continue IVF at 35 ml/hr.  Start Sensitive scale SSI and CBGs q6h. May discontinue if CBGs remain at goal.   TPN lab panels on Mondays & Thursdays.  CMET, Mag, Phos in AM.  F/u daily.   JiLindell SparPharmD, BCPS Pager: [phone]/04/2014 3:16 PM

## 2014-07-17 DIAGNOSIS — D61810 Antineoplastic chemotherapy induced pancytopenia: Secondary | ICD-10-CM

## 2014-07-17 DIAGNOSIS — T451X5A Adverse effect of antineoplastic and immunosuppressive drugs, initial encounter: Secondary | ICD-10-CM

## 2014-07-17 DIAGNOSIS — C189 Malignant neoplasm of colon, unspecified: Secondary | ICD-10-CM

## 2014-07-17 DIAGNOSIS — Z789 Other specified health status: Secondary | ICD-10-CM

## 2014-07-17 DIAGNOSIS — Z515 Encounter for palliative care: Secondary | ICD-10-CM | POA: Insufficient documentation

## 2014-07-17 LAB — COMPREHENSIVE METABOLIC PANEL
ALBUMIN: 2.6 g/dL — AB (ref 3.5–5.0)
ALT: 8 U/L — ABNORMAL LOW (ref 14–54)
ANION GAP: 9 (ref 5–15)
AST: 13 U/L — ABNORMAL LOW (ref 15–41)
Alkaline Phosphatase: 70 U/L (ref 38–126)
BUN: 36 mg/dL — AB (ref 6–20)
CALCIUM: 8.1 mg/dL — AB (ref 8.9–10.3)
CO2: 21 mmol/L — ABNORMAL LOW (ref 22–32)
CREATININE: 1.69 mg/dL — AB (ref 0.44–1.00)
Chloride: 108 mmol/L (ref 101–111)
GFR calc Af Amer: 33 mL/min — ABNORMAL LOW (ref >60)
GFR calc non Af Amer: 29 mL/min — ABNORMAL LOW (ref >60)
GLUCOSE: 130 mg/dL — AB (ref 65–99)
Potassium: 4.1 mmol/L (ref 3.5–5.1)
Sodium: 138 mmol/L (ref 135–145)
TOTAL PROTEIN: 5.7 g/dL — AB (ref 6.5–8.1)
Total Bilirubin: 0.6 mg/dL (ref 0.3–1.2)

## 2014-07-17 LAB — GLUCOSE, CAPILLARY
GLUCOSE-CAPILLARY: 123 mg/dL — AB (ref 65–99)
Glucose-Capillary: 128 mg/dL — ABNORMAL HIGH (ref 65–99)

## 2014-07-17 LAB — MAGNESIUM: Magnesium: 1.8 mg/dL (ref 1.7–2.4)

## 2014-07-17 LAB — PHOSPHORUS: Phosphorus: 1.9 mg/dL — ABNORMAL LOW (ref 2.5–4.6)

## 2014-07-17 MED ORDER — FAT EMULSION 20 % IV EMUL
240.0000 mL | INTRAVENOUS | Status: DC
  Filled 2014-07-17: qty 250

## 2014-07-17 MED ORDER — M.V.I. ADULT IV INJ
INJECTION | INTRAVENOUS | Status: DC
Start: 2014-07-17 — End: 2014-07-17
  Filled 2014-07-17: qty 1200

## 2014-07-17 MED ORDER — OCTREOTIDE ACETATE 500 MCG/ML IJ SOLN
25.0000 ug/h | INTRAMUSCULAR | Status: DC
  Administered 2014-07-17 – 2014-07-20 (×4): 25 ug/h via INTRAVENOUS
  Filled 2014-07-17 (×8): qty 1

## 2014-07-17 MED ORDER — TRACE MINERALS CR-CU-MN-SE-ZN 10-1000-500-60 MCG/ML IV SOLN
INTRAVENOUS | Status: AC
  Administered 2014-07-17: 17:00:00 via INTRAVENOUS
  Filled 2014-07-17: qty 1200

## 2014-07-17 MED ORDER — FAMOTIDINE IN NACL 20-0.9 MG/50ML-% IV SOLN
20.0000 mg | Freq: Two times a day (BID) | INTRAVENOUS | Status: DC
  Administered 2014-07-17 – 2014-07-21 (×8): 20 mg via INTRAVENOUS
  Filled 2014-07-17 (×9): qty 50

## 2014-07-17 MED ORDER — ENOXAPARIN SODIUM 40 MG/0.4ML ~~LOC~~ SOLN
40.0000 mg | SUBCUTANEOUS | Status: DC
  Administered 2014-07-17 – 2014-07-20 (×3): 40 mg via SUBCUTANEOUS
  Filled 2014-07-17 (×5): qty 0.4

## 2014-07-17 MED ORDER — DEXAMETHASONE SODIUM PHOSPHATE 4 MG/ML IJ SOLN
1.0000 mg | INTRAMUSCULAR | Status: DC
  Administered 2014-07-17 – 2014-07-20 (×4): 1 mg via INTRAVENOUS
  Filled 2014-07-17 (×5): qty 0.25

## 2014-07-17 MED ORDER — POTASSIUM PHOSPHATES 15 MMOLE/5ML IV SOLN
15.0000 mmol | Freq: Once | INTRAVENOUS | Status: AC
  Administered 2014-07-17: 15 mmol via INTRAVENOUS
  Filled 2014-07-17: qty 5

## 2014-07-17 MED ORDER — FAT EMULSION 20 % IV EMUL
240.0000 mL | INTRAVENOUS | Status: AC
  Administered 2014-07-17: 240 mL via INTRAVENOUS
  Filled 2014-07-17: qty 250

## 2014-07-17 NOTE — Clinical Social Work Note (Signed)
Clinical Social Work Assessment  Patient Details  Name: Natalie Chapman MRN: [id number] Date of Birth: 02/04/1940  Date of referral:  07/17/14               Reason for consult:  Discharge Planning                Permission sought to share information with:  Family Supports Permission granted to share information::  Yes, Verbal Permission Granted  Name::     Natalie Chapman and Occupational psychologist::     Relationship::     Contact Information:     Housing/Transportation Living arrangements for the past 2 months:  Single Family Home Source of Information:  Patient, Adult Children Patient Interpreter Needed:  None Criminal Activity/Legal Involvement Pertinent to Current Situation/Hospitalization:  No - Comment as needed Significant Relationships:  Adult Children, Other Family Members Lives with:  Adult Children Do you feel safe going back to the place where you live?  Yes Need for family participation in patient care:  Yes (Comment)  Care giving concerns:  Patient lives at home with adult dtr but unsure if she will return. Patient and family continue to have discussions re: DC plans.   Social Worker assessment / plan:  CSW received referral to offer support and assist with DC planning. Patient and family recently met with PMT MD (Dr. Hilma Favors) and CSW staffed case prior to meeting with family. Patient continues to have [place] meeting to address medical plans. Patient was previously living with dtr Natalie Chapman) but family has had discussions re: hospice, returning home, or SNF. Family reports they plan to meet with MD and PMT tomorrow to further discuss plans. Patient and family have SNF list as well. CSW has completed FL2 but SNF search will be dependent on NG being removed and if PEG is placed.   CSW spent time meeting with family and discussing family dynamics. Son lives in [place] and patient has a sister Natalie Chapman) who lives about 2.5 hours away. Sister reports she came into town and has no plans to  leave anytime soon. Sister plans on staying at hospital while patient is admitted. Family told CSW stories re: home life and their visit with family dog at the hospital. Patient became drowsy during assessment and reports she recently received pain medication.   Patient and family thanked CSW for visit and reports that CSW can continue to follow to offer support during admission.  Employment status:  Retired Forensic scientist:  [employer] PT Recommendations:  [place] / Referral to community resources:  [place]  Patient/Family's Response to care:  Family continuing to make decisions re: medical care to determine if they can assist with needs.  Patient/Family's Understanding of and Emotional Response to Diagnosis, Current Treatment, and Prognosis:  Patient spoke about pain and her desire to remove NG tube. Patient reports she is tired of hurting and does not enjoy the NG tube. Family reports they feel gaps "need to be filled in" re: decision making about medical procedures. Family reports they are not sure which direction they want to go but know that they want patient to be comfortable. Family reports PMT has been helpful and they are looking forward to next meeting to have further discussions.  Emotional Assessment Appearance:  Appears stated age Attitude/Demeanor/Rapport:  Sedated Affect (typically observed):  Flat Orientation:  Oriented to Self, Oriented to Place, Oriented to  Time, Oriented to Situation Alcohol / Substance use:  Not Applicable Psych involvement (Current and /  or in the community):  No (Comment)  Discharge Needs  Concerns to be addressed:  Adjustment to Illness, Coping/Stress Concerns, Discharge Planning Concerns Readmission within the last 30 days:  No Current discharge risk:  Chronically ill, Terminally ill Barriers to Discharge:  Family Issues, Continued Medical Work up   [employer], [place] 07/17/2014, 4:32  PM [phone]

## 2014-07-17 NOTE — Progress Notes (Signed)
Subjective: Some nausea even with ng tube.  Objective: Vital signs in last 24 hours: Temp:  [97.5 F (36.4 C)-97.7 F (36.5 C)] 97.7 F (36.5 C) (07/05 1221) Pulse Rate:  [64-109] 109 (07/05 1221) Resp:  [16-18] 16 (07/05 1221) BP: (99-131)/(49-81) 109/53 mmHg (07/05 1221) SpO2:  [100 %] 100 % (07/05 1221) Last BM Date: 07/13/14  Intake/Output from previous day: 07/04 0701 - 07/05 0700 In: 2199.4 [P.O.:360; I.V.:832.4; TPN:1007] Out: 2050 [Urine:1100; Emesis/NG output:950] Intake/Output this shift: Total I/O In: -  Out: 300 [Emesis/NG output:300]  PE: General- In NAD Abdomen-soft, not tender.  Lab Results:   Recent Labs  07/16/14 0435  WBC 6.5  HGB 8.2*  HCT 24.9*  PLT 240   BMET  Recent Labs  07/16/14 0435 07/17/14 0410  NA 139 138  K 4.3 4.1  CL 107 108  CO2 22 21*  GLUCOSE 134* 130*  BUN 36* 36*  CREATININE 1.92* 1.69*  CALCIUM 8.2* 8.1*   PT/INR No results for input(s): LABPROT, INR in the last 72 hours. Comprehensive Metabolic Panel:    Component Value Date/Time   NA 138 07/17/2014 0410   NA 139 07/16/2014 0435   NA 138 07/10/2014 1321   NA 138 06/28/2014 0950   K 4.1 07/17/2014 0410   K 4.3 07/16/2014 0435   K 3.3* 07/10/2014 1321   K 4.4 06/28/2014 0950   CL 108 07/17/2014 0410   CL 107 07/16/2014 0435   CO2 21* 07/17/2014 0410   CO2 22 07/16/2014 0435   CO2 20* 07/10/2014 1321   CO2 21* 06/28/2014 0950   BUN 36* 07/17/2014 0410   BUN 36* 07/16/2014 0435   BUN 34.1* 07/10/2014 1321   BUN 32.7* 06/28/2014 0950   CREATININE 1.69* 07/17/2014 0410   CREATININE 1.92* 07/16/2014 0435   CREATININE 2.2* 07/10/2014 1321   CREATININE 2.1* 06/28/2014 0950   GLUCOSE 130* 07/17/2014 0410   GLUCOSE 134* 07/16/2014 0435   GLUCOSE 140 07/10/2014 1321   GLUCOSE 97 06/28/2014 0950   CALCIUM 8.1* 07/17/2014 0410   CALCIUM 8.2* 07/16/2014 0435   CALCIUM 8.8 07/10/2014 1321   CALCIUM 9.1 06/28/2014 0950   AST 13* 07/17/2014 0410   AST 11*  07/16/2014 0435   AST 12 07/10/2014 1321   AST 14 06/28/2014 0950   ALT 8* 07/17/2014 0410   ALT 8* 07/16/2014 0435   ALT <6 07/10/2014 1321   ALT 8 06/28/2014 0950   ALKPHOS 70 07/17/2014 0410   ALKPHOS 77 07/16/2014 0435   ALKPHOS 73 07/10/2014 1321   ALKPHOS 69 06/28/2014 0950   BILITOT 0.6 07/17/2014 0410   BILITOT 0.5 07/16/2014 0435   BILITOT 1.21* 07/10/2014 1321   BILITOT 0.77 06/28/2014 0950   PROT 5.7* 07/17/2014 0410   PROT 5.9* 07/16/2014 0435   PROT 6.8 07/10/2014 1321   PROT 7.0 06/28/2014 0950   ALBUMIN 2.6* 07/17/2014 0410   ALBUMIN 2.7* 07/16/2014 0435   ALBUMIN 3.2* 07/10/2014 1321   ALBUMIN 3.5 06/28/2014 0950     Studies/Results: No results found.  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   Flush ng tube every 4 hours.   Katreena Schupp J 07/17/2014

## 2014-07-17 NOTE — Progress Notes (Addendum)
During rounds, chaplain visited patiet and family. Chaplain provided a presence and emotional support to family and patient. Patient and family were open to the visit from chaplain. Patient asked chaplain to come back to see her again. Chaplain will follow.   07/17/14 1300  Clinical Encounter Type  Visited With Patient and family together  Visit Type Initial;Psychological support;Spiritual support  Referral From Palliative care team

## 2014-07-17 NOTE — Progress Notes (Addendum)
Patient ID: Natalie Chapman, female   DOB: Dec 03, 1940, 73 y.o.   MRN: [id number] [facility] PROGRESS NOTE  CAITLAIN TWEED BZJ:[id number] DOB: 02/21/1940 DOA: 07/10/2014 PCP: Reymundo Poll, MD  Brief narrative:    74 y.o. female with past medical history colon cancer undergoing chemotherapy, last chemotherapy 6/16, chronic kidney disease stage III, history of right-sided hydronephrosis and ureteral stent placement in 08/03/2014. Pt [facility]/10/2014 with vomiting and diarrhea. C. difficile was negative. CT of the abdomen done 07/12/14 was concerning for SBO. Surgery has seen the patient in consultation with no plans for surgical intervention at this time. Palliative care has also seen the patient in consultation to address symptom management and goals of care. Patient is currently on TPN for nutritional support.  Barrier to discharge: Continues to require NG tube for SBO although will try to clamp today and see how next 24 hours go. PT needs to evaluate her for possible D/C to SNF. Patient is on TPN for nutritional support which was started 07/15/2014.  Assessment/Plan:    Principal Problem:  Small bowel obstruction / Nausea ,vomiting / Dehydration  - CT abdomen on the admission is concerning for small bowel obstruction. This is likely from adhesions considering that patient had prior abdominal surgeries including cholecystectomy, hysterectomy and right colectomy. - Surgery has seen the patient in consultation and currently there is no plan for surgical intervention. -  patient has NG tube for decompression. She had a bowel movement overnight so we'll try to clamp NG tube today and see how she tolerates it. - She reports her nausea is better. If nausea, vomiting worse since then patient may need palliative G-tube or diverting loop ileostomy but for now we'll monitor.  - Patient is on nutritional support with TPN which was started 07/15/2014.   Active Problems:  Hypophosphatemia /  hypomagnesemia - Likely refeeding syndrome from total parenteral nutrition - Supplemented as needed    Colon cancer- pT4a, pN2b, MX, ascending colon s/p lap assisted right colectomy 07/20/13 - Appreciate Dr. Benay Spice of oncology following. - Patient's last chemotherapy was 06/28/2014 with FOLFOX - CT abdomen on this admission showed possible recurrence of the malignancy. - Palliative care is involved for symptom management and goals of care   Atrial fibrillation with rapid ventricular response - CHADS vasc score of at least 3 - Heart rate is mildly elevated at 104. Patient is on metoprolol 2.5 mg IV every 6 hours scheduled. We cannot increase metoprolol further because of slight hypotension this morning. - Patient is on aspirin for anticoagulation. She is not on any other anticoagulate because of risk of GI bleed.   Anti-neoplastic chemotherapy induced pancytopenia / neutropenia - Pancytopenia likely sequela of chemotherapy  - Neutropenia resolved 07/14/14. Hemoglobin stable at 8.2. Platelet count normalized. - Patient has received 2 units of PRBC transfusion on 06/28/2014.   Morbid obesity  - Status post gastric banding surgery. - Body mass index is 46.29 kg/(m^2).   Right hydronephrosis - Has ureteral stent    Elevated bilirubin - Other LFT's WNL   Right middle lobe opacity noted on CT done 07/12/14 - No clinical evidence of pneumonia, only what's seen on CT abdomen - Patient not on any abx since no fever and white blood cell count is within normal limits   Diarrhea - C. difficile studies negative.    Chronic kidney disease (CKD), stage III (moderate) - Baseline creatinine 2.2 - Creatinine improving    Intertriginous dermatitis left breast/skin tear intergluteal fold -  Seen by wound care nurse 07/13/14.  - Care per Rn protocol    DVT Prophylaxis  - Lovenox subQ ordered   Code Status: DNR/DNI Family Communication:  plan of care discussed with the patient and  her son at the bedside  Disposition Plan: ongoing SBO with NG tube. Also, need PT evaluation for safe discharge plan    IV access:  Peripheral IV  Procedures and diagnostic studies:    Ct Abdomen Pelvis Wo Contrast 07/12/2014    Right middle lobe airspace opacity is noted concerning for pneumonia.  Mild splenomegaly is noted.  Status post cholecystectomy, hysterectomy and right colectomy. Stable soft tissue density is seen in the region of surgical site in the right lower quadrant of the anterior abdominal wall which extends into peritoneal space in the right lower quadrant around presumed position of ileocolic anastomosis. While this may simply represent postoperative scarring, recurrent or metastatic malignancy cannot be excluded.  Interval development of small bowel dilatation concerning for distal small bowel obstruction.  Interval placement of right-sided ureteral stent seen extending from right renal pelvis to urinary bladder.   Electronically Signed   By: Marijo Conception, M.D.   On: 07/12/2014 17:37   Dg Abd 2 Views 07/12/2014  Moderately dilated left mid abdominal small bowel loops with differential air-fluid levels suggesting small bowel obstruction.   Electronically Signed   By: Conchita Paris M.D.   On: 07/12/2014 10:38   Dg Abd 2 Views 07/10/2014  Prominent stool in the distal colon.  No free air.   Electronically Signed   By: Lorriane Shire M.D.   On: 07/10/2014 19:02    Medical Consultants:  Oncology Palliative Care Surgery  Other Consultants:  Physical therapy Nutrition  IAnti-Infectives:   None    Leisa Lenz, MD  [facility] Pager [phone]  Time spent in minutes: 25 minutes  If 7PM-7AM, please contact night-coverage [email or web address] Password [email or web address] 07/17/2014, 11:04 AM   LOS: 7 days    HPI/Subjective: No acute overnight events. Patient reports no abdominal pain.  Objective: Filed Vitals:   07/16/14 1822 07/16/14 2018 07/17/14 0016 07/17/14 0459  BP:  110/63 110/52 100/49 99/56  Pulse: 99 64 94 104  Temp:  97.6 F (36.4 C)  97.5 F (36.4 C)  TempSrc:  Oral  Oral  Resp:  18  18  Height:      Weight:      SpO2:  100%  100%    Intake/Output Summary (Last 24 hours) at 07/17/14 1104 Last data filed at 07/17/14 0730  Gross per 24 hour  Intake 2199.37 ml  Output   2100 ml  Net  99.37 ml    Exam:   General:  Pt is alert, follows commands appropriately, not in acute distress  Cardiovascular: tachycardic, irregular rhythm, S1/S2 appreciated   Respiratory: Clear to auscultation bilaterally, no wheezing, no crackles, no rhonchi  Abdomen: Soft, non tender, non distended, bowel sounds present; NG in place   Extremities: No edema, pulses DP and PT palpable bilaterally; small area of redness (per pt has been there for months) on left lower extremity anterior shin  Neuro: Grossly nonfocal  Data Reviewed: Basic Metabolic Panel:  Recent Labs Lab 07/10/14 1321 07/10/14 1725 07/11/14 0455 07/15/14 1723 07/16/14 0435 07/17/14 0410  NA 138  --  139 138 139 138  K 3.3*  --  3.6 4.4 4.3 4.1  CL  --   --  110 108 107 108  CO2 20*  --  21*  22 22 21*  GLUCOSE 140  --  123* 108* 134* 130*  BUN 34.1*  --  41* 35* 36* 36*  CREATININE 2.2* 2.24* 2.26* 1.80* 1.92* 1.69*  CALCIUM 8.8  --  7.8* 8.5* 8.2* 8.1*  MG  --   --   --  1.5* 1.9 1.8  PHOS  --   --   --  <1.0* 3.0 1.9*   Liver Function Tests:  Recent Labs Lab 07/10/14 1321 07/15/14 1723 07/16/14 0435 07/17/14 0410  AST 12 13* 11* 13*  ALT <6 10* 8* 8*  ALKPHOS 73 80 77 70  BILITOT 1.21* 1.0 0.5 0.6  PROT 6.8 6.7 5.9* 5.7*  ALBUMIN 3.2* 3.0* 2.7* 2.6*    Recent Labs Lab 07/10/14 1725  LIPASE 15*   No results for input(s): AMMONIA in the last 168 hours. CBC:  Recent Labs Lab 07/10/14 1321 07/10/14 1725 07/11/14 0455 07/12/14 0600 07/14/14 0452 07/16/14 0435  WBC 2.2* 2.1* 1.7* 1.4* 3.6* 6.5  NEUTROABS 1.4*  --  0.9* 0.6* 2.4 5.2  HGB 9.0* 8.1* 7.0* 7.7*  7.8* 8.2*  HCT 26.8* 23.7* 20.7* 22.6* 23.5* 24.9*  MCV 93.9 93.7 93.7 93.4 94.4 95.4  PLT 148 131* 120* 176 220 240   Cardiac Enzymes: No results for input(s): CKTOTAL, CKMB, CKMBINDEX, TROPONINI in the last 168 hours. BNP: Invalid input(s): POCBNP CBG:  Recent Labs Lab 07/16/14 1108 07/16/14 1804 07/16/14 2357 07/17/14 0551  GLUCAP 128* 120* 127* 128*    Recent Results (from the past 240 hour(s))  Clostridium Difficile by PCR (not at [facility])     Status: None   Collection Time: 07/11/14  3:26 AM  Result Value Ref Range Status   C difficile by pcr NEGATIVE NEGATIVE Final     Scheduled Meds: . aspirin EC  81 mg Oral Daily  . enoxaparin (LOVENOX) injection  40 mg Subcutaneous Q24H  . lactose free nutrition  237 mL Oral TID WC  . metoprolol  2.5 mg Intravenous 4 times per day  . potassium phosphate IVPB (mmol)  15 mmol Intravenous Once   Continuous Infusions: . Marland KitchenTPN (CLINIMIX-E) Adult     And  . fat emulsion    . dextrose 5 % and 0.45 % NaCl with KCl 20 mEq/L 35 mL/hr at 07/17/14 0623  . Marland KitchenTPN (CLINIMIX-E) Adult 42 mL/hr at 07/16/14 1734   And  . fat emulsion 7 mL (07/16/14 1735)

## 2014-07-17 NOTE — Progress Notes (Signed)
Palliative Medicine Team Progress Note  Met with Natalie Chapman, her son, DIL and her sister to help with symptom management and goals of care- care coordination challenges.  Natalie Chapman has a pleasant and witty personality- she is grieving her prognosis and condition, but overall she is trying to be strong especially around her family. She does not want to be a burden on her family but desires to go home before she dies-this is complicated by the fact that her daughter who has mental health problems lives with her and family fear how she will handle her mother's decline. Her son Natalie Chapman's mother in law died of Colon Ca and malignant obstruction about a year ago and he told me privately that it was "awful" and that he is scared for her to be at home-he is however considering this option with paid caregivers and hospice just to honor his mother's wish- she also has full capacity for her own decision making and should be involved in that process.  Main issues:  1. 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 Persistent intermittent nausea- directly related to distention from obstruction most likely.   I support placement of a VENTING gastrostomy tube. I called IR to try to help expedite this evaluation and process-Called however by IR and was informed that Dr. Barbie Banner is unable to do the venting gastrostomy tube because this patient has had a prior gastric bypass and it would need to be done by surgery.  I strongly recommend a malignant obstruction protocol for her symptom management.  Low dose IV decadron start at 78m daily-for bowel wall edema   IV Famotidine +or- PPI to suppress pumps  Given high volume of output I recommend Octreotide- this can be placed into her TPN for ease of administration-given the volume of output she would need to be connected to intermittent suction indefinitely.   Scheduled Zofran and if able to tolerate Reglan for  persistent refractory nausea.  Will need to determine end point on TPN -son tells me they want to stop TPN once gastrostomy tube is placed -but his expectation is that his mother will be taking PO nutrition and be absorbing this to some degree.   TPN has its own set of complications and issues- goals per patient and son are not to prolong her life in a poor state of quality and health- I would advocate for stopping TPN but at that point she would be very appropriate for hospice facility level care- I shared with son a 2-4 week prognosis if full comfort care transition was made and TPN was discontinued. Son thought if TPN stopped that she would have 4-6 months- he also shared with me privately that his mother feels she has 4 months or more to live. I discussed prognostic uncertainty  And also supported their hope for QOL which is the main goal that they all are able to tell me.   IF TPN is going to be continued, I would recommend: Cyclic TPN qhs for patient- and she will need a PICC or stable IV access-port ok if she has one in place. She is still appropriate for hospice referral for hospice home care even with TPN for a short time period.  I discussed all possible disposition options- SNF is probably not a reasonable option and certainly does not seem to be what the patient herself would choose based on my conversation with her today- She is going to need expert EOL  care and guidance-especially if her NG/Venting PEG continue to have high output- SNF will not take her with NG and I am also doubtful they will be able to do intermittent suction even if she is actually able to get a venting PEG-this can all be done at home with the guidance of a home hospice team- or if TPN stopped in a hospice facility.  Will follow along for symptom management and any other care coordination issues- I asked her to consider first and foremost how she would prefer to spend whatever time she has remaining - I suspect it will be  short given obstruction.  Time: 90 minutes  3-430PM  Lane Hacker, [place] [phone]

## 2014-07-17 NOTE — Clinical Social Work Placement (Signed)
   CLINICAL SOCIAL WORK PLACEMENT  NOTE  Date:  07/17/2014  Patient Details  Name: Natalie Chapman MRN: [id number] Date of Birth: August 13, 1940  Clinical Social Work is seeking post-discharge placement for this patient at the [place] level of care (*CSW will initial, date and re-position this form in  chart as items are completed):  Yes   Patient/family provided with [facility] Work Department's list of facilities offering this level of care within the geographic area requested by the patient (or if unable, by the patient's family).  Yes   Patient/family informed of their freedom to choose among providers that offer the needed level of care, that participate in Medicare, Medicaid or managed care program needed by the patient, have an available bed and are willing to accept the patient.  Yes   Patient/family informed of [facility]'s ownership interest in [facility] and [facility], as well as of the fact that they are under no obligation to receive care at these facilities.  PASRR submitted to EDS on 07/17/14     PASRR number received on       Existing PASRR number confirmed on       FL2 transmitted to all facilities in geographic area requested by pt/family on       FL2 transmitted to all facilities within larger geographic area on       Patient informed that his/her managed care company has contracts with or will negotiate with certain facilities, including the following:            Patient/family informed of bed offers received.  Patient chooses bed at       Physician recommends and patient chooses bed at      Patient to be transferred to   on  .  Patient to be transferred to facility by       Patient family notified on   of transfer.  Name of family member notified:        PHYSICIAN       Additional Comment:    _______________________________________________ Boone Master, [place] 07/17/2014, 4:39 PM

## 2014-07-17 NOTE — Progress Notes (Addendum)
PARENTERAL NUTRITION CONSULT NOTE - Follow-Up  Pharmacy Consult for TPN Indication: Small bowel obstruction  No Known Allergies  Patient Measurements: Height: 5' (152.4 cm) Weight: 237 lb (107.502 kg) IBW/kg (Calculated) : 45.5 Adjusted Body Weight: 61 kg  Vital Signs: Temp: 97.5 F (36.4 C) (07/05 0459) Temp Source: Oral (07/05 0459) BP: 99/56 mmHg (07/05 0459) Pulse Rate: 104 (07/05 0459) Intake/Output from previous day: 07/04 0701 - 07/05 0700 In: 2199.4 [P.O.:360; I.V.:832.4; TPN:1007] Out: 2050 [Urine:1100; Emesis/NG output:950] Intake/Output from this shift: Total I/O In: -  Out: 300 [Emesis/NG output:300]  Labs:  Recent Labs  07/16/14 0435  WBC 6.5  HGB 8.2*  HCT 24.9*  PLT 240     Recent Labs  07/15/14 1723 07/16/14 0435 07/17/14 0410  NA 138 139 138  K 4.4 4.3 4.1  CL 108 107 108  CO2 22 22 21*  GLUCOSE 108* 134* 130*  BUN 35* 36* 36*  CREATININE 1.80* 1.92* 1.69*  CALCIUM 8.5* 8.2* 8.1*  MG 1.5* 1.9 1.8  PHOS <1.0* 3.0 1.9*  PROT 6.7 5.9* 5.7*  ALBUMIN 3.0* 2.7* 2.6*  AST 13* 11* 13*  ALT 10* 8* 8*  ALKPHOS 80 77 70  BILITOT 1.0 0.5 0.6  PREALBUMIN  --  6.1*  --   TRIG  --  83  --    Estimated Creatinine Clearance: 32.4 mL/min (by C-G formula based on Cr of 1.69).    Recent Labs  07/16/14 1804 07/16/14 2357 07/17/14 0551  GLUCAP 120* 127* 128*    Medical History: Past Medical History  Diagnosis Date  . Hypertension   . Anemia   . H/O hiatal hernia   . Chronic atrial fibrillation     CARDIOLOGIST-- DR Johnsie Cancel  . Arthritis     LEFT HIP  . Generalized weakness   . Colon cancer dx mar  2015    Stage IIIc, pT4a  pN2b, moderate differentiated adenocarcinoma right colon with 8of15 +lymph nodes for metastatic carcinoma--  s/p  right colectomy 07-20-2013;  chemotherapy 09-05-2013 to 01-30-2014  . 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 Poor appetite   . Frequency of urination   . Wears glasses   . Full dentures   . Dysrhythmia      AFIB  . Chronic kidney disease (CKD), stage I   . Hydronephrosis, right   . Seroma, postoperative 07/27/2013    Medications:  Scheduled:  . aspirin EC  81 mg Oral Daily  . enoxaparin (LOVENOX) injection  30 mg Subcutaneous Q24H  . insulin aspart  0-9 Units Subcutaneous 4 times per day  . lactose free nutrition  237 mL Oral TID WC  . metoprolol  2.5 mg Intravenous 4 times per day   Infusions:  . dextrose 5 % and 0.45 % NaCl with KCl 20 mEq/L 35 mL/hr at 07/17/14 0623  . Marland KitchenTPN (CLINIMIX-E) Adult 42 mL/hr at 07/16/14 1734   And  . fat emulsion 7 mL (07/16/14 1735)   PRN: acetaminophen **OR** acetaminophen, albuterol, labetalol, lidocaine-prilocaine, morphine injection, ondansetron (ZOFRAN) IV, phenol   Insulin Requirements: none ordered  Current Nutrition:  Clear liquids, Boost Plus TID with meals ordered (not receiving)  IVF: D5 1/2 NS w/20 mEq KCl/L at 35 ml/hr  Central access: implanted port TPN start date: 7/3  ASSESSMENT  HPI:  74 yo female with colon cancer undergoing chemotherapy with FOLFOX, most recent treatment on 06/28/14, admitted 07/10/14 with vomiting and diarrhea; CT of the abdomen done 07/12/14 concerning for SBO. [facility] consulted, no need for urgent surgical intervention, but may require a palliative ileostomy for which she would be high risk.  They recommend to consider TPN given severe PCM and high likelihood of it not improving anytime soon.  Significant events:  7/3: Pharmacy consulted to begin TPN. Patient has implanted port for chemotherapy which can be used for TPN.  7/4: NG tube draining dark bilious material; considering palliative gastrostomy tube  Today 07/17/2014:  Glucose: CBG at goal  Electrolytes: phos low - will replace  Renal: CKD III, SCr improved  LFTs: AST/ALT low, Alk Phos WNL, Tbili WNL at 0.5  TGs: 83  Prealbumin: 6.2 (7/2), 6.1  (7/4)  NUTRITIONAL GOALS                                                                                             RD recs:  Kcal: 2000-2200, Protein: 85-95 grams  Per TPN guidelines for patient's with CKD, will restrict protein to ~1g/kg/day based on adjusted body weight, making goals Clinimix E 5/20 at a goal rate of 22m/hr + 20% fat emulsion at 133mhr to provide: 60g/day protein, 1536Kcal/day.  PLAN                                                                                                                         At 1800 today:  Increase Clinimix E 5/20 to goal 50 ml/hr per new goals above based on limiting protein per guidelines secondary to CKD  Increase 20% fat emulsion to 10 ml/hr  Monitor closely for signs of refeeding syndrome.  15 mMol KPhos x 1  TPN to contain standard multivitamins and trace elements.  Continue IVF at 35 ml/hr.  Discontinue CBGs since stable and patient followed by palliative service  TPN lab panels on Mondays & Thursdays.  BMET, Mag, Phos in AM.   JuAdrian SaranPharmD, BCPS Pager [phone]/05/2014 9:47 AM

## 2014-07-17 NOTE — Progress Notes (Signed)
IR received request for palliative venting percutaneous gastrostomy tube placement. Imaging and history reviewed with Dr. Barbie Banner and patient is not a candidate for IR approach secondary to history of gastric bypass. I will contact Dr. Hilma Favors to discuss this. Any questions please call Dr. Barbie Banner at [facility] PA-C Interventional Radiology  07/17/14  4:06 PM

## 2014-07-17 NOTE — Care Management (Signed)
Important Message  Patient Details  Name: Natalie Chapman MRN: [id number] Date of Birth: 06-May-1940   Medicare Important Message Given:  Yes-fourth notification given    Camillo Flaming, LCSW 07/17/2014, 12:06 PM

## 2014-07-17 NOTE — Progress Notes (Signed)
PT Cancellation Note  Patient Details Name: Natalie Chapman MRN: [id number] DOB: 28-Jun-1940   Cancelled Treatment:    Reason Eval/Treat Not Completed: Other (comment) Pt declined this morning feeling too tired and again this afternoon stating she does not feel well and very nauseated.  Pt reports gastrostomy tube soon and aware PT to check back.   Jaxon Flatt,KATHrine E 07/17/2014, 3:36 PM Carmelia Bake, PT, DPT 07/17/2014 Pager: [phone]

## 2014-07-17 NOTE — Progress Notes (Signed)
IP PROGRESS NOTE  Subjective:   Natalie Chapman had a bowel movement this morning.  Objective: Vital signs in last 24 hours: Blood pressure 109/53, pulse 109, temperature 97.7 F (36.5 C), temperature source Oral, resp. rate 16, height 5' (1.524 m), weight 237 lb (107.502 kg), SpO2 100 %.  Intake/Output from previous day: 07/04 0701 - 07/05 0700 In: 2199.4 [P.O.:360; I.V.:832.4; TPN:1007] Out: 2050 [Urine:1100; Emesis/NG output:950]  Physical Exam:  HEENT: NG tube in place  Abdomen: Firm masslike fullness in the right lower abdomen Extremities: Chronic stasis change at the low leg bilaterally   Portacath/PICC-without erythema  Lab Results:  Recent Labs  07/16/14 0435  WBC 6.5  HGB 8.2*  HCT 24.9*  PLT 240    BMET  Recent Labs  07/16/14 0435 07/17/14 0410  NA 139 138  K 4.3 4.1  CL 107 108  CO2 22 21*  GLUCOSE 134* 130*  BUN 36* 36*  CREATININE 1.92* 1.69*  CALCIUM 8.2* 8.1*    Studies/Results: No results found.  Medications: I have reviewed the patient's current medications.  Assessment/Plan: 1. 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 8 of 15 lymph nodes contained metastatic carcinoma, tumor deposits were present; microsatellite stable.  PET scan 08/24/2013 with mildly hypermetabolic small left axillary and right inguinal nodes--most likely unrelated to colon cancer, no clear evidence of metastatic disease.   Cycle 1 adjuvant Xeloda 09/05/2013  Cycle 2 adjuvant Xeloda 09/26/2013  Cycle 3 adjuvant Xeloda 10/17/2013  Cycle 4 adjuvant Xeloda 11/07/2013  Cycle 5 adjuvant Xeloda 11/28/2013  Cycle 6 adjuvant Xeloda 12/19/2013  Cycle 7 adjuvant Xeloda 01/09/2014  Cycle 8 adjuvant Xeloda 01/30/2014  Status post biopsy of nodularity at the right lower quadrant scar on 03/27/2014 confirming metastatic colon cancer  CT 04/05/2014 with new right  hydronephrosis, soft tissue thickening adjacent to the ileocolonic anastomosis and nodularity in the overlying subcutaneous tissue  PET scan 05/02/2014 with hypermetabolic soft tissue adjacent to the ileocolic anastomosis beneath the right abdominal wall. Suspected soft tissue metastasis in the right abdominal wall, peritoneal disease along the right liver and pelvic nodal metastases. Suspected right pelvic implant. Associated mild right hydronephrosis with indwelling ureteral stent.  Cycle 1 FOLFOX 05/17/2014  Cycle 2 FOLFOX 05/31/2014  Cycle 3 FOLFOX 06/14/2014  Cycle 4 FOLFOX 06/28/2014 2. Atrial fibrillation 3. Anemia secondary to bleeding from the colon cancer,renal insufficiency, and chemotherapy-status post 2 units of packed red blood cells beginning 06/28/2014 4. History of tobacco use with COPD changes on the CT 04/07/2013  5. Morbid obesity  6. Status post gastric banding surgery  7. Renal insufficiency  8. Anorexia-etiology unclear. Improved 9. Right hydronephrosis on the CT 04/05/2014. She was referred to urology and underwent placement of a ureter stent on 04/27/2014. Renal function has improved. 10. Port-A-Cath placement 05/15/2014 11. Nausea/vomiting, diarrhea, and dehydration-the plain x-ray and CT on 07/12/2014 is consistent with a partial small bowel obstruction   NG tube placed 07/15/2014 12. 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 The likelihood of surgical intervention helping is small and she is a poor surgical candidate. I agree with the plan for a palliative gastrostomy tube.  I discussed disposition plans at length with Natalie Chapman and her family. The family will not be able to care for her in the home secondary to her immobility. She needs skilled nursing facility placement. She can benefit from Hospice care if her insurance will  allow for this. I discussed the prognosis at length with her  son.  I recommend a liquid diet as tolerated when the gastrostomy tube is in place.    Recommendations:  1. Consult gastroenterology for placement of a palliative gastrostomy tube 2. Care management consult for skilled nursing facility placement and Hospice care if her insurance will allow in the SNF     LOS: 7 days   [place], [place]  07/17/2014, 2:17 PM

## 2014-07-18 DIAGNOSIS — I482 Chronic atrial fibrillation: Secondary | ICD-10-CM

## 2014-07-18 LAB — GLUCOSE, CAPILLARY
GLUCOSE-CAPILLARY: 224 mg/dL — AB (ref 65–99)
GLUCOSE-CAPILLARY: 166 mg/dL — AB (ref 65–99)
Glucose-Capillary: 125 mg/dL — ABNORMAL HIGH (ref 65–99)
Glucose-Capillary: 136 mg/dL — ABNORMAL HIGH (ref 65–99)
Glucose-Capillary: 149 mg/dL — ABNORMAL HIGH (ref 65–99)
Glucose-Capillary: 154 mg/dL — ABNORMAL HIGH (ref 65–99)
Glucose-Capillary: 164 mg/dL — ABNORMAL HIGH (ref 65–99)

## 2014-07-18 LAB — BASIC METABOLIC PANEL
Anion gap: 8 (ref 5–15)
BUN: 34 mg/dL — ABNORMAL HIGH (ref 6–20)
CALCIUM: 8.1 mg/dL — AB (ref 8.9–10.3)
CO2: 21 mmol/L — AB (ref 22–32)
CREATININE: 1.58 mg/dL — AB (ref 0.44–1.00)
Chloride: 106 mmol/L (ref 101–111)
GFR calc non Af Amer: 31 mL/min — ABNORMAL LOW (ref >60)
GFR, EST AFRICAN AMERICAN: 36 mL/min — AB (ref >60)
Glucose, Bld: 239 mg/dL — ABNORMAL HIGH (ref 65–99)
Potassium: 4.8 mmol/L (ref 3.5–5.1)
Sodium: 135 mmol/L (ref 135–145)

## 2014-07-18 LAB — MAGNESIUM: MAGNESIUM: 1.8 mg/dL (ref 1.7–2.4)

## 2014-07-18 LAB — PHOSPHORUS: Phosphorus: 2.6 mg/dL (ref 2.5–4.6)

## 2014-07-18 MED ORDER — SENNOSIDES-DOCUSATE SODIUM 8.6-50 MG PO TABS
1.0000 | ORAL_TABLET | Freq: Every day | ORAL | Status: DC
  Administered 2014-07-18 – 2014-07-20 (×3): 1 via ORAL
  Filled 2014-07-18 (×4): qty 1

## 2014-07-18 MED ORDER — BISACODYL 10 MG RE SUPP: 10.0000 mg | Freq: Every day | RECTAL | Status: DC | PRN

## 2014-07-18 MED ORDER — VITAMINS A & D EX OINT
TOPICAL_OINTMENT | CUTANEOUS | Status: AC
Start: 2014-07-18 — End: 2014-07-18
  Filled 2014-07-18: qty 5

## 2014-07-18 MED ORDER — METOPROLOL TARTRATE 25 MG PO TABS
25.0000 mg | ORAL_TABLET | Freq: Two times a day (BID) | ORAL | Status: DC
  Administered 2014-07-18 – 2014-07-20 (×4): 25 mg via ORAL
  Filled 2014-07-18 (×8): qty 1

## 2014-07-18 MED ORDER — MORPHINE SULFATE (CONCENTRATE) 10 MG/0.5ML PO SOLN
5.0000 mg | ORAL | Status: DC | PRN
  Administered 2014-07-18 – 2014-07-20 (×3): 5 mg via SUBLINGUAL
  Filled 2014-07-18 (×3): qty 0.5

## 2014-07-18 MED ORDER — INSULIN ASPART 100 UNIT/ML ~~LOC~~ SOLN
0.0000 [IU] | SUBCUTANEOUS | Status: DC
  Administered 2014-07-18: 1 [IU] via SUBCUTANEOUS
  Administered 2014-07-18 – 2014-07-19 (×3): 2 [IU] via SUBCUTANEOUS
  Administered 2014-07-19 – 2014-07-21 (×6): 1 [IU] via SUBCUTANEOUS

## 2014-07-18 MED ORDER — IPRATROPIUM BROMIDE 0.06 % NA SOLN
2.0000 | Freq: Three times a day (TID) | NASAL | Status: DC
  Administered 2014-07-19 – 2014-07-21 (×6): 2 via NASAL
  Filled 2014-07-18: qty 15

## 2014-07-18 MED ORDER — FAT EMULSION 20 % IV EMUL
240.0000 mL | INTRAVENOUS | Status: AC
  Administered 2014-07-18: 240 mL via INTRAVENOUS
  Filled 2014-07-18: qty 250

## 2014-07-18 MED ORDER — ACETAMINOPHEN 325 MG PO TABS
650.0000 mg | ORAL_TABLET | Freq: Two times a day (BID) | ORAL | Status: DC
  Administered 2014-07-18 – 2014-07-21 (×6): 650 mg via ORAL
  Filled 2014-07-18 (×8): qty 2

## 2014-07-18 MED ORDER — TRACE MINERALS CR-CU-MN-SE-ZN 10-1000-500-60 MCG/ML IV SOLN
INTRAVENOUS | Status: AC
  Administered 2014-07-18: 17:00:00 via INTRAVENOUS
  Filled 2014-07-18: qty 1200

## 2014-07-18 NOTE — Progress Notes (Signed)
Family complain NGT was not draining. NGT was flushed with 30 ml of water as per order and auscultated.   Ten minutes later family called stating NGT was removed.  Pt refused to have NGT replaced.  On call provider notified.  Pt and family verbalized understanding and agree to keep NGT out until morning. Pt agreed if she felt nauseous she would asked to have NGT reinserted.  Meanwhile pt and family decided to wait to talk with physicians in morning for options.  Will continue to monitor.

## 2014-07-18 NOTE — Progress Notes (Signed)
Patient ID: Natalie Chapman, female   DOB: [birth year], 74 y.o.   MRN: [id number] [facility] PROGRESS NOTE  Natalie Chapman YIF:[id number] DOB: 09-15-1940 DOA: 07/10/2014 PCP: Reymundo Poll, MD  Brief narrative:    74 y.o. female with past medical history colon cancer undergoing chemotherapy, last chemotherapy 6/16, chronic kidney disease stage III, history of right-sided hydronephrosis and ureteral stent placement in 08/03/2014. Pt presented to [facility] 07/10/2014 with vomiting and diarrhea. C. difficile was negative. CT of the abdomen done 07/12/14 was concerning for SBO. Surgery has seen the patient in consultation with no plans for surgical intervention at this time. Palliative care has also seen the patient in consultation to address symptom management and goals of care. Patient is currently on TPN for nutritional support.  Barrier to discharge: Patient by mistake removed NG tube. She feels fine this morning and tolerates small amount of clear liquids, Mountain Dew, coffee, water. Surgery recommends palliative gastrostomy tube. Will follow-up with interventional radiology if they can do this. I anticipate discharge in next day or so depending on when G-tube can be placed and her symptoms are controlled.  Assessment/Plan:    Principal Problem:  Small bowel obstruction / Nausea ,vomiting / Dehydration  - CT abdomen on the admission is concerning for small bowel obstruction. This is likely from adhesions considering that patient had prior abdominal surgeries including cholecystectomy, hysterectomy and right colectomy. - Surgery has seen the patient in consultation and currently there is no plan for surgical intervention. Recommendation is for palliative gastrostomy tubes. Patient apparently had gastric stapling but not gastric bypass. - Patient has removed NG tube last night. So far she feels okay with no nausea or vomiting. She tolerates small amount of clears. - We will follow-up with  interventional radiology if gastrostomy tube can be placed. - Continue TPN for nutritional support. She was on TPN since 07/15/2014.  Active Problems:  Hypophosphatemia / hypomagnesemia - Likely refeeding syndrome from total parenteral nutrition - Supplemented as needed    Colon cancer- pT4a, pN2b, MX, ascending colon s/p lap assisted right colectomy 07/20/13 - Patient's last chemotherapy was 06/28/2014 with FOLFOX - CT abdomen on this admission showed possible recurrence of the malignancy. - Palliative care is involved for symptom management and goals of care - Dr. Benay Spice of oncology has seen the patient in consultation.    Atrial fibrillation with rapid ventricular response - CHADS vasc score of at least 3 - Patient is on aspirin for anticoagulation. She is not on any other anticoagulate because of risk of GI bleed. - Rate controlled with metoprolol   Anti-neoplastic chemotherapy induced pancytopenia / neutropenia - Pancytopenia likely sequela of chemotherapy  - Neutropenia resolved 07/14/14. Hemoglobin stable at 8.2. Platelet count normalized. - Patient has received 2 units of PRBC transfusion on 06/28/2014.   Morbid obesity  - Status post gastric stapling. - Body mass index is 46.29 kg/(m^2).   Right hydronephrosis - Has ureteral stent    Elevated bilirubin - Other LFT's WNL   Right middle lobe opacity noted on CT done 07/12/14 - No clinical evidence of pneumonia, only what's seen on CT abdomen - Patient not on any abx since no fever and white blood cell count is within normal limits    Diarrhea - C. difficile studies negative.    Chronic kidney disease (CKD), stage III (moderate) - Baseline creatinine 2.2 - Creatinine improving, 1.58 this am   Intertriginous dermatitis left breast/skin tear intergluteal fold - Seen by wound care nurse 07/13/14.  DVT Prophylaxis  - Lovenox subQ ordered   Code Status: DNR/DNI Family Communication:  plan of care  discussed with the patient and her son at the bedside  Disposition Plan: Home likely in next couple of days if tolerates clears and after palliative gastric tube placed  IV access:  Peripheral IV  Procedures and diagnostic studies:    Ct Abdomen Pelvis Wo Contrast 07/12/2014    Right middle lobe airspace opacity is noted concerning for pneumonia.  Mild splenomegaly is noted.  Status post cholecystectomy, hysterectomy and right colectomy. Stable soft tissue density is seen in the region of surgical site in the right lower quadrant of the anterior abdominal wall which extends into peritoneal space in the right lower quadrant around presumed position of ileocolic anastomosis. While this may simply represent postoperative scarring, recurrent or metastatic malignancy cannot be excluded.  Interval development of small bowel dilatation concerning for distal small bowel obstruction.  Interval placement of right-sided ureteral stent seen extending from right renal pelvis to urinary bladder.   Electronically Signed   By: Marijo Conception, M.D.   On: 07/12/2014 17:37   Dg Abd 2 Views 07/12/2014  Moderately dilated left mid abdominal small bowel loops with differential air-fluid levels suggesting small bowel obstruction.   Electronically Signed   By: Conchita Paris M.D.   On: 07/12/2014 10:38   Dg Abd 2 Views 07/10/2014  Prominent stool in the distal colon.  No free air.   Electronically Signed   By: Lorriane Shire M.D.   On: 07/10/2014 19:02    Medical Consultants:  Oncology Palliative Care Surgery  Other Consultants:  Physical therapy Nutrition  IAnti-Infectives:   None    Leisa Lenz, MD  [facility] Pager [phone]  Time spent in minutes: 15 minutes  If 7PM-7AM, please contact night-coverage [email or web address] Password [email or web address] 07/18/2014, 12:32 PM   LOS: 8 days    HPI/Subjective: No acute overnight events. Patient reports no nausea or vomiting.  Objective: Filed Vitals:   07/17/14  1221 07/17/14 1820 07/17/14 2101 07/18/14 0500  BP: 109/53 112/64 119/67 118/54  Pulse: 109  104 99  Temp: 97.7 F (36.5 C)  97.6 F (36.4 C) 98.4 F (36.9 C)  TempSrc: Oral  Oral Oral  Resp: 16  16 16   Height:      Weight:      SpO2: 100%  98% 97%    Intake/Output Summary (Last 24 hours) at 07/18/14 1232 Last data filed at 07/18/14 1209  Gross per 24 hour  Intake    270 ml  Output    500 ml  Net   -230 ml    Exam:   General:  Pt is alert, no acute distress  Cardiovascular: Irregular rhythm, rate controlled, appreciate S1-S2  Respiratory: Bilateral air entry, no wheezing  Abdomen: NG tube out, nontender abdomen, bowel sounds appreciated  Extremities: No edema, pulses palpable  Neuro: No focal deficits  Data Reviewed: Basic Metabolic Panel:  Recent Labs Lab 07/15/14 1723 07/16/14 0435 07/17/14 0410 07/18/14 0650  NA 138 139 138 135  K 4.4 4.3 4.1 4.8  CL 108 107 108 106  CO2 22 22 21* 21*  GLUCOSE 108* 134* 130* 239*  BUN 35* 36* 36* 34*  CREATININE 1.80* 1.92* 1.69* 1.58*  CALCIUM 8.5* 8.2* 8.1* 8.1*  MG 1.5* 1.9 1.8 1.8  PHOS <1.0* 3.0 1.9* 2.6   Liver Function Tests:  Recent Labs Lab 07/15/14 1723 07/16/14 0435 07/17/14 0410  AST 13* 11* 13*  ALT 10* 8* 8*  ALKPHOS 80 77 70  BILITOT 1.0 0.5 0.6  PROT 6.7 5.9* 5.7*  ALBUMIN 3.0* 2.7* 2.6*   No results for input(s): LIPASE, AMYLASE in the last 168 hours. No results for input(s): AMMONIA in the last 168 hours. CBC:  Recent Labs Lab 07/12/14 0600 07/14/14 0452 07/16/14 0435  WBC 1.4* 3.6* 6.5  NEUTROABS 0.6* 2.4 5.2  HGB 7.7* 7.8* 8.2*  HCT 22.6* 23.5* 24.9*  MCV 93.4 94.4 95.4  PLT 176 220 240   Cardiac Enzymes: No results for input(s): CKTOTAL, CKMB, CKMBINDEX, TROPONINI in the last 168 hours. BNP: Invalid input(s): POCBNP CBG:  Recent Labs Lab 07/17/14 1756 07/18/14 0007 07/18/14 0607 07/18/14 1007 07/18/14 1204  GLUCAP 123* 164* 224* 166* 154*    Recent Results  (from the past 240 hour(s))  Clostridium Difficile by PCR (not at [facility])     Status: None   Collection Time: 07/11/14  3:26 AM  Result Value Ref Range Status   C difficile by pcr NEGATIVE NEGATIVE Final     Scheduled Meds: . aspirin EC  81 mg Oral Daily  . dexamethasone  1 mg Intravenous Q24H  . enoxaparin (LOVENOX) injection  40 mg Subcutaneous Q24H  . famotidine (PEPCID) IV  20 mg Intravenous Q12H  . insulin aspart  0-9 Units Subcutaneous 6 times per day  . lactose free nutrition  237 mL Oral TID WC  . metoprolol  2.5 mg Intravenous 4 times per day  . vitamin A & D       Continuous Infusions: . Marland KitchenTPN (CLINIMIX-E) Adult 50 mL/hr at 07/17/14 1709   And  . fat emulsion 240 mL (07/17/14 1709)  . Marland KitchenTPN (CLINIMIX-E) Adult     And  . fat emulsion    . dextrose 5 % and 0.45 % NaCl with KCl 20 mEq/L 35 mL/hr at 07/17/14 0623  . octreotide  (SANDOSTATIN)    IV infusion 25 mcg/hr (07/17/14 1818)

## 2014-07-18 NOTE — Progress Notes (Signed)
Palliative Medicine Team Progress Note  Natalie Chapman looks much improved this afternoon. She pulled her NG tube out by accident last PM. Fortunately her output dropped dramatically yesterday after starting the malignant obstruction protocol (octreotide, famotidine, decadron)- she also has had improved energy and reports feeling much better and more like herself today- typically after starting this regimen we see a dramatic short term improvement followed by a slow but steady decline. She has been drinking mountain Dew and chicken broth all day and is doing well with this- she is asking for full liquids- my concern is if she has colonic obstruction the small bowel may have a proximal reserve -she needs to have a BM soon or we may be looking at complete obstruction and she will eventually revert back to vomiting and need NG tube for management of that symptom. She has agreed to a suppository stimulant laxative trial. TPN continues but if she gets through this evening without symptoms I would recommend discontinuing the TPN and never restarting. She complains of hip pain with ambulation- this is chronic but limiting her now.  1. Advance to Full Liquid 2. Ok for her to use PRN roxanol low dose for hip pain 3. She requests scheduled tylenol-should be fine to do this 4. Continue malignant obstruction regimen for another 24- 48 hours-in the meantime we need clarity on whether or not a gastrostomy is or is not a real possibility and who is the right provider to place the tube and does risk >benefits given her anatomy.  Following closely.  Lane Hacker, DO Palliative Medicine [phone]

## 2014-07-18 NOTE — Care Management Note (Signed)
Case Management Note  Patient Details  Name: Natalie Chapman MRN: [id number] Date of Birth: [birth year]   Expected Discharge Date:                  Expected Discharge Plan:  [place]  In-House Referral:     Discharge planning Services  CM Consult  Post Acute Care Choice:  Home Health Choice offered to:  Patient (Adult Niece)  DME Arranged:  3-N-1, Walker rolling (youth-short and bariatric walker and  bariatric  3in1) DME Agency:     HH Arranged:  RN, PT, Nurse's Aide [place] Agency:  [facility]  Status of Service:  In process, will continue to follow  Medicare Important Message Given:  Yes-fourth notification given Date Medicare IM Given:    Medicare IM give by:    Date Additional Medicare IM Given:    Additional Medicare Important Message give by:     If discussed at [place] of Stay Meetings, dates discussed:    Additional Comments: Pt and sister in the room and asking about delivery of bariatric 3 in 1 by [facility]. RW was delivered to pt room. Sister given number for [facility] to check on time of delivery. This CM called [facility] HH services rep to make sure they will be following at DC. CM will continue to follow. Lynnell Catalan, RN 07/18/2014, 11:27 AM

## 2014-07-18 NOTE — Progress Notes (Signed)
PARENTERAL NUTRITION CONSULT NOTE - Follow-Up  Pharmacy Consult for TPN Indication: Small bowel obstruction  No Known Allergies  Patient Measurements: Height: 5' (152.4 cm) Weight: 237 lb (107.502 kg) IBW/kg (Calculated) : 45.5 Adjusted Body Weight: 61 kg  Vital Signs: Temp: 98.4 F (36.9 C) (07/06 0500) Temp Source: Oral (07/06 0500) BP: 118/54 mmHg (07/06 0500) Pulse Rate: 99 (07/06 0500) Intake/Output from previous day: 07/05 0701 - 07/06 0700 In: 30 [NG/GT:30] Out: 300 [Emesis/NG output:300] Intake/Output from this shift: Total I/O In: -  Out: 200 [Urine:200]  Labs:  Recent Labs  07/16/14 0435  WBC 6.5  HGB 8.2*  HCT 24.9*  PLT 240     Recent Labs  07/15/14 1723 07/16/14 0435 07/17/14 0410 07/18/14 0650  NA 138 139 138 135  K 4.4 4.3 4.1 4.8  CL 108 107 108 106  CO2 22 22 21* 21*  GLUCOSE 108* 134* 130* 239*  BUN 35* 36* 36* 34*  CREATININE 1.80* 1.92* 1.69* 1.58*  CALCIUM 8.5* 8.2* 8.1* 8.1*  MG 1.5* 1.9 1.8 1.8  PHOS <1.0* 3.0 1.9* 2.6  PROT 6.7 5.9* 5.7*  --   ALBUMIN 3.0* 2.7* 2.6*  --   AST 13* 11* 13*  --   ALT 10* 8* 8*  --   ALKPHOS 80 77 70  --   BILITOT 1.0 0.5 0.6  --   PREALBUMIN  --  6.1*  --   --   TRIG  --  83  --   --    Estimated Creatinine Clearance: 34.7 mL/min (by C-G formula based on Cr of 1.58).    Recent Labs  07/17/14 1756 07/18/14 0007 07/18/14 0607  GLUCAP 123* 164* 224*    Medical History: Past Medical History  Diagnosis Date  . Hypertension   . Anemia   . H/O hiatal hernia   . Chronic atrial fibrillation     CARDIOLOGIST-- DR Johnsie Cancel  . Arthritis     LEFT HIP  . Generalized weakness   . Colon cancer dx mar  2015    Stage IIIc, pT4a  pN2b, moderate differentiated adenocarcinoma right colon with 8of15 +lymph nodes for metastatic carcinoma--  s/p  right colectomy 07-20-2013;  chemotherapy 09-05-2013 to 01-30-2014  . 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 Poor appetite   . Frequency of urination   . Wears  glasses   . Full dentures   . Dysrhythmia     AFIB  . Chronic kidney disease (CKD), stage I   . Hydronephrosis, right   . Seroma, postoperative 07/27/2013    Medications:  Scheduled:  . aspirin EC  81 mg Oral Daily  . dexamethasone  1 mg Intravenous Q24H  . enoxaparin (LOVENOX) injection  40 mg Subcutaneous Q24H  . famotidine (PEPCID) IV  20 mg Intravenous Q12H  . lactose free nutrition  237 mL Oral TID WC  . metoprolol  2.5 mg Intravenous 4 times per day  . vitamin A & D       Infusions:  . Marland KitchenTPN (CLINIMIX-E) Adult 50 mL/hr at 07/17/14 1709   And  . fat emulsion 240 mL (07/17/14 1709)  . dextrose 5 % and 0.45 % NaCl with KCl 20 mEq/L 35 mL/hr at 07/17/14 0623  . octreotide  (SANDOSTATIN)    IV infusion 25 mcg/hr (07/17/14 1818)   PRN: acetaminophen **OR** acetaminophen, albuterol, labetalol, lidocaine-prilocaine, morphine injection, ondansetron (ZOFRAN) IV, phenol   Insulin Requirements: none ordered  Current Nutrition:  Clear liquids, Boost Plus TID with  meals ordered (not receiving)  IVF: D5 1/2 NS w/20 mEq KCl/L at 35 ml/hr  Central access: implanted port TPN start date: 7/3  ASSESSMENT                                                                                                          HPI:  74 yo female with colon cancer undergoing chemotherapy with FOLFOX, most recent treatment on 06/28/14, admitted 07/10/14 with vomiting and diarrhea; CT of the abdomen done 07/12/14 concerning for SBO. CCS consulted, no need for urgent surgical intervention, but may require a palliative ileostomy for which she would be high risk.  They recommend to consider TPN given severe PCM and high likelihood of it not improving anytime soon.  Significant events:  7/3: Pharmacy consulted to begin TPN. Patient has implanted port for chemotherapy which can be used for TPN.  7/4: NG tube draining dark bilious material; considering palliative gastrostomy tube. 7/5: TPN advanced to goal. Octreotide  drip started.   Today 07/18/2014:  NG tube came out. Tolerating clear liquids. Surgery recommending palliative gastrostomy tube per IR.  Glucose: CBGs elevated w/ advancement of TPN and low dose steroid started 7/5.  Electrolytes: WNL. CorrCa 9.2. Phos improved after bolus yest.   Renal: CKD III, SCr improving.  LFTs: AST/ALT low, Alk Phos WNL, Tbili WNL at 0.5  TGs: 83  Prealbumin: 6.2 (7/2), 6.1 (7/4)  NUTRITIONAL GOALS                                                                                             RD recs:  Kcal: 2000-2200, Protein: 85-95 grams  Per TPN guidelines for patient's with CKD, will restrict protein to ~1g/kg/day based on adjusted body weight, making goals Clinimix E 5/20 at a goal rate of 75m/hr + 20% fat emulsion at 145mhr to provide: 60g/day protein, 1536Kcal/day.  PLAN                                                                                                                         At 1800 today:  Cont Clinimix E 5/20 to goal 50 ml/hr per new goals above based on limiting protein  per guidelines secondary to CKD.  Cont 20% fat emulsion at 10 ml/hr.  Monitor closely for signs of refeeding syndrome.  TPN to contain standard multivitamins and trace elements.  Continue IVF at 35 ml/hr. MD: consider removing dextrose.  Resume SSI q6h.  TPN lab panels on Mondays & Thursdays.  F/u plans for discharge and decision to cont TPN or not.  Romeo Rabon, PharmD, pager [phone]. 07/18/2014,9:31 AM.

## 2014-07-18 NOTE — Progress Notes (Signed)
Clinical Social Work  CSW met with patient and sister at bedside. Sister reports that patient just fell asleep and asked CSW to let patient sleep. Sister reports that patient had a good night and has been feeling better today. Sister reports that she is concerned that patient has unrealistic expectations due to feeling well today. Sister reports she thinks it is cruel that God would allow patient to have a good day and give patient false hope. CSW and sister spoke about family's ability to cope with EOL news. Sister reports family likes to laugh and joke. Sister shared that family had a good time last night giving patient a bath and cutting her hair. Sister feels that patient and her dtr had a good visit and were able to enjoy being with each other instead of dtr having to be a caregiver. Sister reports that dtr feels guilty that patient got sick when she was on vacation. Sister reports she feels she is coping well but patient has made statements about being upset that she will be the first sibling to pass. Patient is the second oldest of six and has made comments about wanting to see family before she passes away.  Sister reports patient plans to DC with [facility] when stable but agreeable for CSW to continue to follow for support throughout hospital stay.  [place], [place] [phone]

## 2014-07-18 NOTE — Progress Notes (Signed)
Ng tube fell out.  She had a good night and feels good this AM.  I recommend a palliative gastrostomy tube if she has recurrent n/v.  Note:  she had gastric stapling, not a gastric bypass in the distant past.

## 2014-07-19 ENCOUNTER — Inpatient Hospital Stay (HOSPITAL_COMMUNITY): Payer: Medicare Other

## 2014-07-19 ENCOUNTER — Encounter: Payer: Self-pay | Admitting: *Deleted

## 2014-07-19 DIAGNOSIS — Z87891 Personal history of nicotine dependence: Secondary | ICD-10-CM

## 2014-07-19 DIAGNOSIS — C786 Secondary malignant neoplasm of retroperitoneum and peritoneum: Secondary | ICD-10-CM

## 2014-07-19 DIAGNOSIS — J449 Chronic obstructive pulmonary disease, unspecified: Secondary | ICD-10-CM

## 2014-07-19 DIAGNOSIS — C787 Secondary malignant neoplasm of liver and intrahepatic bile duct: Secondary | ICD-10-CM

## 2014-07-19 DIAGNOSIS — I4891 Unspecified atrial fibrillation: Secondary | ICD-10-CM

## 2014-07-19 DIAGNOSIS — N289 Disorder of kidney and ureter, unspecified: Secondary | ICD-10-CM

## 2014-07-19 DIAGNOSIS — K5660 Unspecified intestinal obstruction: Secondary | ICD-10-CM

## 2014-07-19 DIAGNOSIS — D6481 Anemia due to antineoplastic chemotherapy: Secondary | ICD-10-CM

## 2014-07-19 DIAGNOSIS — D500 Iron deficiency anemia secondary to blood loss (chronic): Secondary | ICD-10-CM

## 2014-07-19 LAB — CBC
HCT: 27 % — ABNORMAL LOW (ref 36.0–46.0)
Hemoglobin: 8.7 g/dL — ABNORMAL LOW (ref 12.0–15.0)
MCH: 30.2 pg (ref 26.0–34.0)
MCHC: 32.2 g/dL (ref 30.0–36.0)
MCV: 93.8 fL (ref 78.0–100.0)
Platelets: 285 10*3/uL (ref 150–400)
RBC: 2.88 MIL/uL — AB (ref 3.87–5.11)
RDW: 19.7 % — ABNORMAL HIGH (ref 11.5–15.5)
WBC: 9.2 10*3/uL (ref 4.0–10.5)

## 2014-07-19 LAB — COMPREHENSIVE METABOLIC PANEL
ALT: 10 U/L — ABNORMAL LOW (ref 14–54)
ANION GAP: 6 (ref 5–15)
AST: 13 U/L — ABNORMAL LOW (ref 15–41)
Albumin: 2.9 g/dL — ABNORMAL LOW (ref 3.5–5.0)
Alkaline Phosphatase: 84 U/L (ref 38–126)
BUN: 36 mg/dL — ABNORMAL HIGH (ref 6–20)
CALCIUM: 8.4 mg/dL — AB (ref 8.9–10.3)
CO2: 23 mmol/L (ref 22–32)
CREATININE: 1.64 mg/dL — AB (ref 0.44–1.00)
Chloride: 104 mmol/L (ref 101–111)
GFR calc non Af Amer: 30 mL/min — ABNORMAL LOW (ref >60)
GFR, EST AFRICAN AMERICAN: 34 mL/min — AB (ref >60)
GLUCOSE: 133 mg/dL — AB (ref 65–99)
Potassium: 4.6 mmol/L (ref 3.5–5.1)
Sodium: 133 mmol/L — ABNORMAL LOW (ref 135–145)
Total Bilirubin: 0.7 mg/dL (ref 0.3–1.2)
Total Protein: 6.8 g/dL (ref 6.5–8.1)

## 2014-07-19 LAB — GLUCOSE, CAPILLARY
GLUCOSE-CAPILLARY: 114 mg/dL — AB (ref 65–99)
GLUCOSE-CAPILLARY: 154 mg/dL — AB (ref 65–99)
GLUCOSE-CAPILLARY: 163 mg/dL — AB (ref 65–99)
Glucose-Capillary: 143 mg/dL — ABNORMAL HIGH (ref 65–99)
Glucose-Capillary: 118 mg/dL — ABNORMAL HIGH (ref 65–99)
Glucose-Capillary: 126 mg/dL — ABNORMAL HIGH (ref 65–99)

## 2014-07-19 LAB — MAGNESIUM: Magnesium: 1.9 mg/dL (ref 1.7–2.4)

## 2014-07-19 LAB — PHOSPHORUS: PHOSPHORUS: 2.5 mg/dL (ref 2.5–4.6)

## 2014-07-19 LAB — PROTIME-INR
INR: 1.09 (ref 0.00–1.49)
Prothrombin Time: 14.3 seconds (ref 11.6–15.2)

## 2014-07-19 LAB — APTT: aPTT: 30 seconds (ref 24–37)

## 2014-07-19 MED ORDER — TRACE MINERALS CR-CU-MN-SE-ZN 10-1000-500-60 MCG/ML IV SOLN
INTRAVENOUS | Status: AC
  Administered 2014-07-19: 17:00:00 via INTRAVENOUS
  Filled 2014-07-19: qty 1200

## 2014-07-19 MED ORDER — MIDAZOLAM HCL 2 MG/2ML IJ SOLN
INTRAMUSCULAR | Status: AC | PRN
Start: 2014-07-19 — End: 2014-07-19
  Administered 2014-07-19: 0.5 mg via INTRAVENOUS
  Administered 2014-07-19: 1 mg via INTRAVENOUS

## 2014-07-19 MED ORDER — FENTANYL CITRATE (PF) 100 MCG/2ML IJ SOLN
50.0000 ug | Freq: Once | INTRAMUSCULAR | Status: AC
  Administered 2014-07-19: 50 ug via INTRAVENOUS

## 2014-07-19 MED ORDER — LIDOCAINE HCL 1 % IJ SOLN
INTRAMUSCULAR | Status: AC
  Filled 2014-07-19: qty 20

## 2014-07-19 MED ORDER — IOHEXOL 300 MG/ML  SOLN
10.0000 mL | Freq: Once | INTRAMUSCULAR | Status: AC | PRN
  Administered 2014-07-19: 10 mL

## 2014-07-19 MED ORDER — MIDAZOLAM HCL 2 MG/2ML IJ SOLN
INTRAMUSCULAR | Status: AC
  Filled 2014-07-19: qty 6

## 2014-07-19 MED ORDER — FAT EMULSION 20 % IV EMUL
240.0000 mL | INTRAVENOUS | Status: AC
  Administered 2014-07-19: 240 mL via INTRAVENOUS
  Filled 2014-07-19: qty 250

## 2014-07-19 MED ORDER — FENTANYL CITRATE (PF) 100 MCG/2ML IJ SOLN
INTRAMUSCULAR | Status: AC
  Filled 2014-07-19: qty 2

## 2014-07-19 MED ORDER — CEFAZOLIN SODIUM-DEXTROSE 2-3 GM-% IV SOLR
INTRAVENOUS | Status: AC
  Filled 2014-07-19: qty 50

## 2014-07-19 NOTE — Progress Notes (Signed)
Clinical Social Work  CSW came to room to meet with patient and family. Patient out of room for procedure. CSW will follow up at later time.  [place], [place] [phone]

## 2014-07-19 NOTE — Progress Notes (Signed)
Patient ID: Natalie Chapman, female   DOB: 01-15-1940, 74 y.o.   MRN: [id number] [facility] PROGRESS NOTE  Natalie Chapman VOJ:[id number] DOB: [birth year] DOA: 07/10/2014 PCP: Reymundo Poll, MD  Brief narrative:    74 y.o. female with past medical history colon cancer undergoing chemotherapy, last chemotherapy 6/16, chronic kidney disease stage III, history of right-sided hydronephrosis and ureteral stent placement in 08/03/2014. Pt presented to [facility] 07/10/2014 with vomiting and diarrhea. C. difficile was negative. CT of the abdomen done 07/12/14 was concerning for SBO. Surgery has seen the patient in consultation with no plans for surgical intervention at this time. Palliative care has also seen the patient in consultation to address symptom management and goals of care. Patient is currently on TPN for nutritional support.  Barrier to discharge: Plan for gastrostomy tube placement today per IR if possible. Anticipated discharge by 07/21/2014 if patient continues to feel well.  Assessment/Plan:    Principal Problem:  Small bowel obstruction / Nausea ,vomiting / Dehydration  - CT abdomen on the admission is concerning for small bowel obstruction. This is likely from adhesions considering that patient had prior abdominal surgeries including cholecystectomy, hysterectomy and right colectomy. - Surgery has seen the patient in consultation and currently there is no plan for surgical intervention. Recommendation is for palliative gastrostomy tube.  - We will follow-up with interventional radiology if placement of gastrostomy tube possible. - Please note that patient did have NG tube but she removed that during the night on 07/17/2014. Since then she did not complain of vomiting. She is trying clear to full liquid diet which in small amounts she tolerates okay. - She is still on the nutritional support with TPN which was started 07/15/2014.  Active Problems:  Hypophosphatemia / hypomagnesemia -  Likely refeeding syndrome from total parenteral nutrition - Supplemented   Colon cancer- pT4a, pN2b, MX, ascending colon s/p lap assisted right colectomy 07/20/13 - Patient's last chemotherapy was 06/28/2014 with FOLFOX - CT abdomen on this admission showed possible recurrence of the malignancy. - Appreciate palliative care involvement with goals of care and symptom management   Atrial fibrillation with rapid ventricular response - CHADS vasc score of at least 3 - Patient is on aspirin for anticoagulation. She has history of GI bleed and therefore is at risk for bleed with either anti-coagulants - Rate controlled with metoprolol   Anti-neoplastic chemotherapy induced pancytopenia / neutropenia - Pancytopenia likely sequela of chemotherapy  - Neutropenia resolved 07/14/14. Hemoglobin stable at 8.7. Platelet count normalized. - Patient has received 2 units of PRBC transfusion on 06/28/2014.   Morbid obesity / moderate protein calorie malnutrition - Status post gastric stapling in past. Malnutrition likely secondary to small bowel obstruction and malignancy. - Body mass index is 46.29 kg/(m^2).   Right hydronephrosis - Has ureteral stent  - Voiding freely   Elevated bilirubin - Other LFT's WNL   Right middle lobe opacity noted on CT done 07/12/14 - No clinical evidence of pneumonia, only what's seen on CT abdomen   Diarrhea - C. difficile studies negative.  - Diarrhea resolved   Chronic kidney disease (CKD), stage III (moderate) - Baseline creatinine 2.2 - Creatinine 1.6 this am.   Intertriginous dermatitis left breast/skin tear intergluteal fold - Seen by wound care nurse 07/13/14.    DVT Prophylaxis  - Lovenox subQ ordered while patient in hospital   Code Status: DNR/DNI Family Communication:  plan of care discussed with the patient and her daughter-in-law at the bedside  Disposition Plan:  Possible placement of gastrostomy tube today. Anticipated discharge by  Saturday, 07/21/2014 if patient continues to feel well.  IV access:  Peripheral IV  Procedures and diagnostic studies:    Ct Abdomen Pelvis Wo Contrast 07/12/2014    Right middle lobe airspace opacity is noted concerning for pneumonia.  Mild splenomegaly is noted.  Status post cholecystectomy, hysterectomy and right colectomy. Stable soft tissue density is seen in the region of surgical site in the right lower quadrant of the anterior abdominal wall which extends into peritoneal space in the right lower quadrant around presumed position of ileocolic anastomosis. While this may simply represent postoperative scarring, recurrent or metastatic malignancy cannot be excluded.  Interval development of small bowel dilatation concerning for distal small bowel obstruction.  Interval placement of right-sided ureteral stent seen extending from right renal pelvis to urinary bladder.   Electronically Signed   By: Marijo Conception, M.D.   On: 07/12/2014 17:37   Dg Abd 2 Views 07/12/2014  Moderately dilated left mid abdominal small bowel loops with differential air-fluid levels suggesting small bowel obstruction.   Electronically Signed   By: Conchita Paris M.D.   On: 07/12/2014 10:38   Dg Abd 2 Views 07/10/2014  Prominent stool in the distal colon.  No free air.   Electronically Signed   By: Lorriane Shire M.D.   On: 07/10/2014 19:02    Medical Consultants:  Oncology Palliative Care Surgery Interventional radiology  Other Consultants:  Physical therapy Nutrition  IAnti-Infectives:   None    Leisa Lenz, MD  [facility] Pager [phone]  Time spent in minutes: 15 minutes  If 7PM-7AM, please contact night-coverage [email or web address] Password [email or web address] 07/19/2014, 10:52 AM   LOS: 9 days    HPI/Subjective: No acute overnight events. Patient reports no nausea or vomiting, no abdominal pain  Objective: Filed Vitals:   07/17/14 2101 07/18/14 0500 07/18/14 1439 07/18/14 2119  BP: 119/67 118/54 110/54  93/53  Pulse: 104 99 79 72  Temp: 97.6 F (36.4 C) 98.4 F (36.9 C) 97.7 F (36.5 C) 97.4 F (36.3 C)  TempSrc: Oral Oral Oral Oral  Resp: 16 16 16 16   Height:      Weight:      SpO2: 98% 97% 100% 100%    Intake/Output Summary (Last 24 hours) at 07/19/14 1052 Last data filed at 07/19/14 0527  Gross per 24 hour  Intake      0 ml  Output    450 ml  Net   -450 ml    Exam:   General:  Pt is alert, awake, in no distress  Cardiovascular: Irregular rhythm, appreciate S1-S2  Respiratory: Clear to auscultation bilaterally, no wheezing  Abdomen: Non-tender abdomen, appreciate bowel sounds  Extremities: No leg swelling, pulses palpable bilaterally  Neuro: No focal neurological deficits  Data Reviewed: Basic Metabolic Panel:  Recent Labs Lab 07/15/14 1723 07/16/14 0435 07/17/14 0410 07/18/14 0650 07/19/14 0508  NA 138 139 138 135 133*  K 4.4 4.3 4.1 4.8 4.6  CL 108 107 108 106 104  CO2 22 22 21* 21* 23  GLUCOSE 108* 134* 130* 239* 133*  BUN 35* 36* 36* 34* 36*  CREATININE 1.80* 1.92* 1.69* 1.58* 1.64*  CALCIUM 8.5* 8.2* 8.1* 8.1* 8.4*  MG 1.5* 1.9 1.8 1.8 1.9  PHOS <1.0* 3.0 1.9* 2.6 2.5   Liver Function Tests:  Recent Labs Lab 07/15/14 1723 07/16/14 0435 07/17/14 0410 07/19/14 0508  AST 13* 11* 13* 13*  ALT 10* 8* 8* 10*  ALKPHOS 80 77 70 84  BILITOT 1.0 0.5 0.6 0.7  PROT 6.7 5.9* 5.7* 6.8  ALBUMIN 3.0* 2.7* 2.6* 2.9*   No results for input(s): LIPASE, AMYLASE in the last 168 hours. No results for input(s): AMMONIA in the last 168 hours. CBC:  Recent Labs Lab 07/14/14 0452 07/16/14 0435 07/19/14 0508  WBC 3.6* 6.5 9.2  NEUTROABS 2.4 5.2  --   HGB 7.8* 8.2* 8.7*  HCT 23.5* 24.9* 27.0*  MCV 94.4 95.4 93.8  PLT 220 240 285   Cardiac Enzymes: No results for input(s): CKTOTAL, CKMB, CKMBINDEX, TROPONINI in the last 168 hours. BNP: Invalid input(s): POCBNP CBG:  Recent Labs Lab 07/18/14 1620 07/18/14 2002 07/19/14 0002 07/19/14 0434  07/19/14 0746  GLUCAP 149* 125* 154* 163* 143*    Recent Results (from the past 240 hour(s))  Clostridium Difficile by PCR (not at [facility])     Status: None   Collection Time: 07/11/14  3:26 AM  Result Value Ref Range Status   C difficile by pcr NEGATIVE NEGATIVE Final     Scheduled Meds: . acetaminophen  650 mg Oral Q12H  . aspirin EC  81 mg Oral Daily  . dexamethasone  1 mg Intravenous Q24H  . enoxaparin (LOVENOX) injection  40 mg Subcutaneous Q24H  . famotidine (PEPCID) IV  20 mg Intravenous Q12H  . insulin aspart  0-9 Units Subcutaneous 6 times per day  . ipratropium  2 spray Each Nare TID  . lactose free nutrition  237 mL Oral TID WC  . metoprolol tartrate  25 mg Oral BID  . senna-docusate  1 tablet Oral Daily   Continuous Infusions: . Marland KitchenTPN (CLINIMIX-E) Adult 50 mL/hr at 07/18/14 1719   And  . fat emulsion 240 mL (07/18/14 1719)  . dextrose 5 % and 0.45 % NaCl with KCl 20 mEq/L 35 mL/hr at 07/19/14 0038  . octreotide  (SANDOSTATIN)    IV infusion 25 mcg/hr (07/18/14 2214)

## 2014-07-19 NOTE — Consult Note (Signed)
Reason for consult: Palliative-venting gastrostomy tube  Referring Physician(s): [facility]  History of Present Illness: Natalie Chapman is a 74 y.o. female with history of right colectomy in July 2015 for stage III colon cancer as well as prior gastric stapling in 1980s (morbid obesity), cholecystectomy and hysterectomy. 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 The patient has been unable to tolerate even a liquid diet to a significant degree and request has now been made for placement of a primarily palliative/bending gastrostomy tube. Past medical history significant for recent pneumonia, chronic kidney disease, atrial fibrillation and right hydronephrosis with ureteral stent placement.  Past Medical History  Diagnosis Date  . Hypertension   . Anemia   . H/O hiatal hernia   . Chronic atrial fibrillation     CARDIOLOGIST-- DR Johnsie Cancel  . Arthritis     LEFT HIP  . Generalized weakness   . Colon cancer dx mar  2015    Stage IIIc, pT4a  pN2b, moderate differentiated adenocarcinoma right colon with 8of15 +lymph nodes for metastatic carcinoma--  s/p  right colectomy 07-20-2013;  chemotherapy 09-05-2013 to 01-30-2014  . 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 Poor appetite   . Frequency of urination   . Wears glasses   . Full dentures   . Dysrhythmia     AFIB  . Chronic kidney disease (CKD), stage I   . Hydronephrosis, right   . Seroma, postoperative 07/27/2013    Past Surgical History  Procedure Laterality Date  . Colonoscopy N/A 05/02/2013    Procedure: COLONOSCOPY;  Surgeon: Jeryl Columbia, MD;  Location: [facility];  Service: Endoscopy;  Laterality: N/A;  . Esophagogastroduodenoscopy endoscopy    . Incisional hernia repair  1980's  . Laparoscopic partial colectomy N/A 07/20/2013    Procedure: LAPAROSCOPIC  ASSISTED RIGHT COLECTOMY;  Surgeon: Odis Hollingshead, MD;   Location: [place];  Service: General;  Laterality: N/A;  . Gastric restriction surgery  [phone]   [facility]  . Transthoracic echocardiogram  06-20-2013   dr Johnsie Cancel    severe LAE/  ef 55-60%/  mild MR and PR/  moderate TR/  moderate AV calcification without stenosis  . Cholecystectomy  1980's  . Tubal ligation  1973  . Tonsillectomy  as child  . Cystoscopy w/ ureteral stent placement Right 04/27/2014    Procedure: CYSTOSCOPY WITH RIGHT URETERAL STENT PLACEMENT;  Surgeon: Ardis Hughs, MD;  Location: [facility];  Service: Urology;  Laterality: Right;  . Portacath placement Left 05/15/2014    Procedure: INSERTION PORT-A-CATH WITH ULTRASOUND;  Surgeon: Jackolyn Confer, MD;  Location: [place];  Service: General;  Laterality: Left;  . Operative ultrasound Left 05/15/2014    Procedure: OPERATIVE ULTRASOUND;  Surgeon: Jackolyn Confer, MD;  Location: [place];  Service: General;  Laterality: Left;  . Hiatal hernia repair      Allergies: Review of patient's allergies indicates no known allergies.  Medications: Prior to Admission medications   Medication Sig Start Date End Date Taking? Authorizing Provider  acetaminophen (TYLENOL) 650 MG CR tablet Take 1,300 mg by mouth every morning. Arthritis pain   Yes Historical Provider, MD  aspirin EC 81 MG tablet Take 81 mg by mouth daily.   Yes Historical Provider, MD  atorvastatin (LIPITOR) 10 MG tablet Take 5 mg by mouth daily with breakfast.    Yes Historical Provider, MD  docusate sodium (COLACE) 100 MG capsule Take  100 mg by mouth daily as needed for mild constipation.   Yes Historical Provider, MD  lidocaine-prilocaine (EMLA) cream Apply 1 application topically as needed. APPLY TO PORTACATH 1-2 HOURS PRIOR TO USE 05/03/14  Yes Owens Shark, NP  metoprolol succinate (TOPROL-XL) 25 MG 24 hr tablet Take 25 mg by mouth every morning.    Yes Historical Provider, MD  Multiple Vitamins-Iron (MULTIVITAMIN/IRON PO) Take 1 tablet by mouth daily.   Yes  Historical Provider, MD  polyethylene glycol (MIRALAX / GLYCOLAX) packet Take 17 g by mouth daily as needed for mild constipation.    Yes Historical Provider, MD  prochlorperazine (COMPAZINE) 5 MG tablet Take 1 tablet (5 mg total) by mouth every 6 (six) hours as needed for nausea or vomiting. 05/03/14  Yes Owens Shark, NP     Family History  Problem Relation Age of Onset  . Heart disease Mother   . Heart disease Father     History   Social History  . Marital Status: Married    Spouse Name: N/A  . Number of Children: N/A  . Years of Education: N/A   Social History Main Topics  . Smoking status: Former Smoker -- 1.00 packs/day for 30 years    Types: Cigarettes    Quit date: 04/19/1990  . Smokeless tobacco: Never Used  . Alcohol Use: No  . Drug Use: No  . Sexual Activity: Not on file   Other Topics Concern  . None   Social History Narrative      Review of Systems SEE ABOVE  Vital Signs: BP 93/53 mmHg  Pulse 72  Temp(Src) 97.4 F (36.3 C) (Oral)  Resp 16  Ht 5' (1.524 m)  Wt 237 lb (107.502 kg)  BMI 46.29 kg/m2  SpO2 100%  Physical Exam patient awake, alert; chest with slightly diminished breath sounds right base, left clear; clean, intact right chest wall Port-A-Cath, heart- irregularly irregular; abdomen- obese, soft, positive bowel sounds, nontender; lower extremities with chronic stasis changes bilaterally  Mallampati Score:     Imaging: Ct Abdomen Pelvis Wo Contrast  07/12/2014   CLINICAL DATA:  Acute generalized abdominal pain.  EXAM: CT ABDOMEN AND PELVIS WITHOUT CONTRAST  TECHNIQUE: Multidetector CT imaging of the abdomen and pelvis was performed following the standard protocol without IV contrast.  COMPARISON:  CT scan of April 05, 2014.  FINDINGS: Severe multilevel degenerative disc disease is noted in the lumbar spine. Minimal right pleural effusion is noted. Right middle lobe airspace opacity is noted concerning for pneumonia.  Status post  cholecystectomy. Mild splenomegaly is noted. Visualized portion of pancreas appears normal. Atherosclerosis of abdominal aorta is noted without aneurysm formation. Left adrenal gland is not well visualized. Right adrenal gland appears normal. Moderate right hydronephrosis is noted. Ureteral stent is seen extending from inferior portion of right renal pelvis to urinary bladder. Prominent left extrarenal pelvis is noted without ureteral dilatation. There is interval development of small bowel dilatation concerning for distal obstruction. Status post right colectomy. Stable soft tissue density is seen in surgical site in right lower quadrant of anterior abdominal wall, which appears to extend into the peritoneal space in the right lower quadrant. No abnormal fluid collection is noted. Urinary bladder is decompressed. Status post hysterectomy.  IMPRESSION: Right middle lobe airspace opacity is noted concerning for pneumonia.  Mild splenomegaly is noted.  Status post cholecystectomy, hysterectomy and right colectomy. Stable soft tissue density is seen in the region of surgical site in the right lower quadrant of  the anterior abdominal wall which extends into peritoneal space in the right lower quadrant around presumed position of ileocolic anastomosis. While this may simply represent postoperative scarring, recurrent or metastatic malignancy cannot be excluded.  Interval development of small bowel dilatation concerning for distal small bowel obstruction.  Interval placement of right-sided ureteral stent seen extending from right renal pelvis to urinary bladder.   Electronically Signed   By: Marijo Conception, M.D.   On: 07/12/2014 17:37   Dg Abd 2 Views  07/12/2014   CLINICAL DATA:  Nausea and vomiting, metastatic colon cancer  EXAM: ABDOMEN - 2 VIEW  COMPARISON:  07/10/2014  FINDINGS: Left upper quadrant clips are noted. Right-sided Port-A-Cath in place with tip over the distal SVC. Patchy bibasilar airspace opacities  are noted but not further characterized on this nondedicated exam. Several differential air-fluid levels are identified over the left upper quadrant with probable mild small bowel dilatation over the left mid abdomen measuring 3.6 cm maximally. Severe left and mild right hip degenerative change. Moderate disc degenerative change in the lumbar spine. Right-sided nephro ureteral stent in place. No free air.  IMPRESSION: Moderately dilated left mid abdominal small bowel loops with differential air-fluid levels suggesting small bowel obstruction.   Electronically Signed   By: Conchita Paris M.D.   On: 07/12/2014 10:38   Dg Abd 2 Views  07/10/2014   CLINICAL DATA:  Nausea and vomiting and abdominal pain. Colon cancer.  EXAM: ABDOMEN - 2 VIEW  COMPARISON:  PET-CT dated 05/02/2014  FINDINGS: Right ureteral stent in place. No free air. There is air in the nondistended distal colon. The patient has had resection of the ascending colon. There is a prominent stool ball in the rectum. There is increased density in the right side of the abdomen which is created by the liver which is pushed inferiorly by the COPD.  There is increased density in the left lower quadrant which could represent distended stool filled colon.  Severe arthritis of the left hip. Diffuse degenerative changes in the lumbar spine. Multiple surgical clips in the left upper quadrant from previous gastric surgery.  IMPRESSION: Prominent stool in the distal colon.  No free air.   Electronically Signed   By: Lorriane Shire M.D.   On: 07/10/2014 19:02    Labs:  CBC:  Recent Labs  07/12/14 0600 07/14/14 0452 07/16/14 0435 07/19/14 0508  WBC 1.4* 3.6* 6.5 9.2  HGB 7.7* 7.8* 8.2* 8.7*  HCT 22.6* 23.5* 24.9* 27.0*  PLT 176 220 240 285    COAGS:  Recent Labs  05/15/14 0724 07/19/14 0508  INR 1.15 1.09  APTT  --  30    BMP:  Recent Labs  07/16/14 0435 07/17/14 0410 07/18/14 0650 07/19/14 0508  NA 139 138 135 133*  K 4.3 4.1 4.8  4.6  CL 107 108 106 104  CO2 22 21* 21* 23  GLUCOSE 134* 130* 239* 133*  BUN 36* 36* 34* 36*  CALCIUM 8.2* 8.1* 8.1* 8.4*  CREATININE 1.92* 1.69* 1.58* 1.64*  GFRNONAA 25* 29* 31* 30*  GFRAA 29* 33* 36* 34*    LIVER FUNCTION TESTS:  Recent Labs  07/15/14 1723 07/16/14 0435 07/17/14 0410 07/19/14 0508  BILITOT 1.0 0.5 0.6 0.7  AST 13* 11* 13* 13*  ALT 10* 8* 8* 10*  ALKPHOS 80 77 70 84  PROT 6.7 5.9* 5.7* 6.8  ALBUMIN 3.0* 2.7* 2.6* 2.9*    TUMOR MARKERS:  Recent Labs  02/22/14 1549 04/06/14 0900 05/17/14  0903 06/28/14 0950  CEA 7.8* 11.9* 25.8* 24.9*    Assessment and Plan: Natalie Chapman is a 74 y.o. female with history of right colectomy in July 2015 for stage III colon cancer as well as prior gastric stapling in 1980s (morbid obesity), cholecystectomy and hysterectomy. 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 The patient has been unable to tolerate even a liquid diet to a significant degree and request has now been made for placement of a primarily palliative/bending gastrostomy tube. Past medical history significant for recent pneumonia, chronic kidney disease, atrial fibrillation and right hydronephrosis with ureteral stent placement. Imaging studies have been reviewed by Dr. Annamaria Boots. Labs checked. At this time plan is for fluoroscopic guided evaluation of the abdomen prior to attempt at gastrostomy tube placement.Risks and benefits discussed with the patient/family including, but not limited to the need for a barium enema during the procedure, bleeding, infection, peritonitis,  damage to adjacent structures and inabilityto place tube. All of the patient's questions were answered, patient is agreeable to proceed.Consent signed and in chart. Procedure tentatively planned for later today.     Signed: D. Lennette Bihari Allred 07/19/2014, 10:00 AM   I spent a total  of 40 minutes in face to face in clinical consultation, greater than 50% of which was counseling/coordinating care for percutaneous gastrostomy tube placement

## 2014-07-19 NOTE — Progress Notes (Signed)
Nutrition Follow-up  DOCUMENTATION CODES:  Non-severe (moderate) malnutrition in context of chronic illness, Morbid obesity  INTERVENTION:  TPN per Pharmacy RD to continue to monitor for needs  NUTRITION DIAGNOSIS:  Increased nutrient needs related to cancer and cancer related treatments as evidenced by estimated needs.  Ongoing.  GOAL:  Patient will meet greater than or equal to 90% of their needs  Meeting with TPN.  MONITOR:  Weight trends, Labs, I & O's, Other (Comment) (TPN)  ASSESSMENT:  74 y.o. female with a Past Medical History of colon cancer undergoing chemotherapy (last chemotherapy on 6/16), chronic knee disease stage III, history of right-sided hydronephrosis underwent ureteral stent placement (due for change on 08/03/14) who presents today with the above noted complaint. Per patient, for the past 4-5 days she has had slowly worsening nausea vomiting and persistent diarrhea. She denies any fever or abdominal pain.   7/7: -Pt having palliative g-tube placement today per MD note.  -RD will continue to monitor for needs prior to discharge (estimated to be 7/9)  Labs reviewed: CBGs: 143-163 Low Na Elevated BUN & Creatinine Mg/Phos WNL  Plan per Pharmacy: At 1800 today:  Cont Clinimix E 5/20 to goal 50 ml/hr per new goals above based on limiting protein per guidelines secondary to CKD.  Cont 20% fat emulsion at 10 ml/hr.  F/u plans for discharge (possibly 7/9) and weaning TNA   7/4: -Per family, pt has not consumed a meal in ~2 weeks and prior to this span she would eat a few bites/meal and state she was full; may be at risk for refeeding.  -Daughter indicates that pt has lost 200-215 lbs in the past few years. Per weight hx review, pt's weight has been stable for the past 5 months.  -Mild to moderate muscle wasting to upper body as well as moderate to severe fluid accumulation to feet and ankles noted.  Height:  Ht Readings from Last 1 Encounters:   07/10/14 5' (1.524 m)    Weight:  Wt Readings from Last 1 Encounters:  07/10/14 237 lb (107.502 kg)    Ideal Body Weight:  45.45 kg (kg)  Wt Readings from Last 10 Encounters:  07/10/14 237 lb (107.502 kg)  06/28/14 235 lb 11.2 oz (106.913 kg)  06/14/14 237 lb 8 oz (107.729 kg)  05/31/14 233 lb 8 oz (105.915 kg)  05/03/14 230 lb 12.8 oz (104.69 kg)  04/27/14 230 lb (104.327 kg)  04/23/14 230 lb 14.4 oz (104.736 kg)  04/06/14 233 lb 12.8 oz (106.051 kg)  03/23/14 240 lb (108.863 kg)  02/22/14 236 lb 1.6 oz (107.094 kg)    BMI:  Body mass index is 46.29 kg/(m^2).  Estimated Nutritional Needs:  Kcal:  2000-2200  Protein:  85-95 grams  Fluid:  2 L/day    Skin:  Wound (see comment) (Stage 2 sacral pressure ulcer)  Diet Order:  .TPN (CLINIMIX-E) Adult Diet NPO time specified Except for: Sips with Meds .TPN (CLINIMIX-E) Adult  EDUCATION NEEDS:  No education needs identified at this time   Intake/Output Summary (Last 24 hours) at 07/19/14 1121 Last data filed at 07/19/14 0527  Gross per 24 hour  Intake      0 ml  Output    450 ml  Net   -450 ml    Last BM:  7/7  Clayton Bibles, MS, RD, LDN Pager: [phone] After Hours Pager: [phone]

## 2014-07-19 NOTE — Progress Notes (Signed)
PARENTERAL NUTRITION CONSULT NOTE - Follow-Up  Pharmacy Consult for TPN Indication: Small bowel obstruction  No Known Allergies  Patient Measurements: Height: 5' (152.4 cm) Weight: 237 lb (107.502 kg) IBW/kg (Calculated) : 45.5 Adjusted Body Weight: 61 kg  Vital Signs:   Intake/Output from previous day: 07/06 0701 - 07/07 0700 In: 240 [P.O.:240] Out: 800 [Urine:800] Intake/Output from this shift:    Labs:  Recent Labs  07/19/14 0508  WBC 9.2  HGB 8.7*  HCT 27.0*  PLT 285  APTT 30  INR 1.09     Recent Labs  07/17/14 0410 07/18/14 0650 07/19/14 0508  NA 138 135 133*  K 4.1 4.8 4.6  CL 108 106 104  CO2 21* 21* 23  GLUCOSE 130* 239* 133*  BUN 36* 34* 36*  CREATININE 1.69* 1.58* 1.64*  CALCIUM 8.1* 8.1* 8.4*  MG 1.8 1.8 1.9  PHOS 1.9* 2.6 2.5  PROT 5.7*  --  6.8  ALBUMIN 2.6*  --  2.9*  AST 13*  --  13*  ALT 8*  --  10*  ALKPHOS 70  --  84  BILITOT 0.6  --  0.7   Estimated Creatinine Clearance: 33.4 mL/min (by C-G formula based on Cr of 1.64).    Recent Labs  07/19/14 0002 07/19/14 0434 07/19/14 0746  GLUCAP 154* 163* 143*    Medical History: Past Medical History  Diagnosis Date  . Hypertension   . Anemia   . H/O hiatal hernia   . Chronic atrial fibrillation     CARDIOLOGIST-- DR Johnsie Cancel  . Arthritis     LEFT HIP  . Generalized weakness   . Colon cancer dx mar  2015    Stage IIIc, pT4a  pN2b, moderate differentiated adenocarcinoma right colon with 8of15 +lymph nodes for metastatic carcinoma--  s/p  right colectomy 07-20-2013;  chemotherapy 09-05-2013 to 01-30-2014  . 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 Poor appetite   . Frequency of urination   . Wears glasses   . Full dentures   . Dysrhythmia     AFIB  . Chronic kidney disease (CKD), stage I   . Hydronephrosis, right   . Seroma, postoperative 07/27/2013    Medications:  Scheduled:  . acetaminophen  650 mg Oral Q12H  . aspirin EC  81 mg Oral Daily  . dexamethasone  1 mg  Intravenous Q24H  . enoxaparin (LOVENOX) injection  40 mg Subcutaneous Q24H  . famotidine (PEPCID) IV  20 mg Intravenous Q12H  . insulin aspart  0-9 Units Subcutaneous 6 times per day  . ipratropium  2 spray Each Nare TID  . lactose free nutrition  237 mL Oral TID WC  . metoprolol tartrate  25 mg Oral BID  . senna-docusate  1 tablet Oral Daily   Infusions:  . Marland KitchenTPN (CLINIMIX-E) Adult 50 mL/hr at 07/18/14 1719   And  . fat emulsion 240 mL (07/18/14 1719)  . dextrose 5 % and 0.45 % NaCl with KCl 20 mEq/L 35 mL/hr at 07/19/14 0038  . octreotide  (SANDOSTATIN)    IV infusion 25 mcg/hr (07/18/14 2214)   PRN: albuterol, bisacodyl, labetalol, lidocaine-prilocaine, morphine injection, morphine CONCENTRATE, ondansetron (ZOFRAN) IV, phenol   Insulin Requirements: 8 units SSI  Current Nutrition:  Clear liquids, Boost Plus TID with meals ordered (not receiving)  IVF: D5 1/2 NS w/20 mEq KCl/L at 35 ml/hr  Central access: implanted port TPN start date: 7/3  ASSESSMENT  HPI:  74 yo female with colon cancer undergoing chemotherapy with FOLFOX, most recent treatment on 06/28/14, admitted 07/10/14 with vomiting and diarrhea; CT of the abdomen done 07/12/14 concerning for SBO. [facility]CS consulted, no need for urgent surgical intervention, but may require a palliative ileostomy for which she would be high risk.  They recommend to consider TPN given severe PCM and high likelihood of it not improving anytime soon.  Significant events:  7/3: Pharmacy consulted to begin TPN. Patient has implanted port for chemotherapy which can be used for TPN.  7/4: NG tube draining dark bilious material; considering palliative gastrostomy tube. 7/5: TPN advanced to goal. Octreotide drip started. 7/6:  NG tube came out. Tolerating clear liquids and advanced to FLD. Surgery recommending palliative gastrostomy tube per IR. 7/7:  NPO this AM for possible G-tube placement.  BM last night.    Today 07/19/2014:    Glucose: CBGs now improved to goal (<150) after SSI resumed.  Electrolytes: Na slightly low, Corr Ca 9.0. Phos, Mg WNL.   Renal: CKD III, SCr improving, stable overnight.   LFTs: AST/ALT low, stable. Alk Phos and T Bili WNL.   TGs: 83 (7/4)  Prealbumin: 6.2 (7/2), 6.1 (7/4)  NUTRITIONAL GOALS                                                                                             RD recs:  Kcal: 2000-2200, Protein: 85-95 grams  Per TPN guidelines for patient's with CKD, will restrict protein to ~1g/kg/day based on adjusted body weight, making goals Clinimix E 5/20 at a goal rate of 31m/hr + 20% fat emulsion at 163mhr to provide: 60g/day protein, 1536Kcal/day.  PLAN                                                                                                                         At 1800 today:  Cont Clinimix E 5/20 to goal 50 ml/hr per new goals above based on limiting protein per guidelines secondary to CKD.  Cont 20% fat emulsion at 10 ml/hr.  TPN to contain standard multivitamins and trace elements.  Continue IVF at 35 ml/hr. MD: consider removing dextrose.  Resume SSI q6h.  BMET tomorrow   TPN lab panels on Mondays & Thursdays.  F/u plans for discharge (possibly 7/9) and weaning TNA

## 2014-07-19 NOTE — [facility] Oncology Navigator Note (Signed)
Oncology Nurse Navigator Documentation  Oncology Nurse Navigator Flowsheets 07/19/2014  Navigator Encounter Type 2 month F/U visit  Patient Visit Type Inpatient  Treatment Phase Other  Barriers/Navigation Needs Education--G-tube, Hospice  Interventions Discussed rationale for G-tube and Hospice care in home.  Education Method Verbal  Time Spent with Patient 64  Was sitting in recliner upon entry. Family present. She is feeling better and wants to go home. She says, "I know I don't have but a few months to live, but I want to be at home". She has come to terms with her illness and prognosis. Talks at ease with occasional tears. Enjoys review of life events. Currently NPO awaiting possible G-tube placement.

## 2014-07-19 NOTE — Progress Notes (Signed)
IP PROGRESS NOTE  Subjective:   Natalie Chapman pulled the NG tube out on 07/17/2014. She reports no nausea and is tolerating liquids. She had a bowel movement today. She was able to ambulate to the restroom.  Objective: Vital signs in last 24 hours: Blood pressure 93/53, pulse 72, temperature 97.4 F (36.3 C), temperature source Oral, resp. rate 16, height 5' (1.524 m), weight 237 lb (107.502 kg), SpO2 100 %.  Intake/Output from previous day: 07/06 0701 - 07/07 0700 In: 240 [P.O.:240] Out: 800 [Urine:800]  Physical Exam:    Abdomen: Firm masslike fullness in the right lower abdomen Extremities: Chronic stasis change at the low leg bilaterally   Portacath/PICC-without erythema  Lab Results:  Recent Labs  07/19/14 0508  WBC 9.2  HGB 8.7*  HCT 27.0*  PLT 285    BMET  Recent Labs  07/18/14 0650 07/19/14 0508  NA 135 133*  K 4.8 4.6  CL 106 104  CO2 21* 23  GLUCOSE 239* 133*  BUN 34* 36*  CREATININE 1.58* 1.64*  CALCIUM 8.1* 8.4*    Studies/Results: No results found.  Medications: I have reviewed the patient's current medications.  Assessment/Plan: 1. 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 8 of 15 lymph nodes contained metastatic carcinoma, tumor deposits were present; microsatellite stable.  PET scan 08/24/2013 with mildly hypermetabolic small left axillary and right inguinal nodes--most likely unrelated to colon cancer, no clear evidence of metastatic disease.   Cycle 1 adjuvant Xeloda 09/05/2013  Cycle 2 adjuvant Xeloda 09/26/2013  Cycle 3 adjuvant Xeloda 10/17/2013  Cycle 4 adjuvant Xeloda 11/07/2013  Cycle 5 adjuvant Xeloda 11/28/2013  Cycle 6 adjuvant Xeloda 12/19/2013  Cycle 7 adjuvant Xeloda 01/09/2014  Cycle 8 adjuvant Xeloda 01/30/2014  Status post biopsy of nodularity at the right lower quadrant scar on 03/27/2014 confirming  metastatic colon cancer  CT 04/05/2014 with new right hydronephrosis, soft tissue thickening adjacent to the ileocolonic anastomosis and nodularity in the overlying subcutaneous tissue  PET scan 05/02/2014 with hypermetabolic soft tissue adjacent to the ileocolic anastomosis beneath the right abdominal wall. Suspected soft tissue metastasis in the right abdominal wall, peritoneal disease along the right liver and pelvic nodal metastases. Suspected right pelvic implant. Associated mild right hydronephrosis with indwelling ureteral stent.  Cycle 1 FOLFOX 05/17/2014  Cycle 2 FOLFOX 05/31/2014  Cycle 3 FOLFOX 06/14/2014  Cycle 4 FOLFOX 06/28/2014 2. Atrial fibrillation 3. Anemia secondary to bleeding from the colon cancer,renal insufficiency, and chemotherapy-status post 2 units of packed red blood cells beginning 06/28/2014 4. History of tobacco use with COPD changes on the CT 04/07/2013  5. Morbid obesity  6. Status post gastric banding surgery  7. Renal insufficiency  8. Anorexia-etiology unclear. Improved 9. Right hydronephrosis on the CT 04/05/2014. She was referred to urology and underwent placement of a ureter stent on 04/27/2014. Renal function has improved. 10. Port-A-Cath placement 05/15/2014 11. Nausea/vomiting, diarrhea, and dehydration-the plain x-ray and CT on 07/12/2014 is consistent with a partial small bowel obstruction   NG tube placed 07/15/2014, removed 07/17/2014 12. 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 The obstructive symptoms have improved, but are likely to recur. She is tolerating liquids at present.       Recommendations:  1. Liquid diet 2. Home with Hospice if she is able to ambulate, skilled nursing facility placement if she is not ambulatory 3. Decision on gastrostomy tube per surgery/interventional radiology  LOS: 9 days   [place]  07/19/2014, 1:47 PM

## 2014-07-19 NOTE — Procedures (Signed)
Successful 20 fr GTUBE No comp Stable Full use tomorrow

## 2014-07-20 LAB — BASIC METABOLIC PANEL
ANION GAP: 5 (ref 5–15)
BUN: 40 mg/dL — AB (ref 6–20)
CALCIUM: 8 mg/dL — AB (ref 8.9–10.3)
CHLORIDE: 104 mmol/L (ref 101–111)
CO2: 22 mmol/L (ref 22–32)
CREATININE: 1.63 mg/dL — AB (ref 0.44–1.00)
GFR calc Af Amer: 35 mL/min — ABNORMAL LOW (ref >60)
GFR calc non Af Amer: 30 mL/min — ABNORMAL LOW (ref >60)
Glucose, Bld: 139 mg/dL — ABNORMAL HIGH (ref 65–99)
Potassium: 5.6 mmol/L — ABNORMAL HIGH (ref 3.5–5.1)
Sodium: 131 mmol/L — ABNORMAL LOW (ref 135–145)

## 2014-07-20 LAB — GLUCOSE, CAPILLARY
GLUCOSE-CAPILLARY: 135 mg/dL — AB (ref 65–99)
Glucose-Capillary: 139 mg/dL — ABNORMAL HIGH (ref 65–99)
Glucose-Capillary: 130 mg/dL — ABNORMAL HIGH (ref 65–99)
Glucose-Capillary: 93 mg/dL (ref 65–99)
Glucose-Capillary: 110 mg/dL — ABNORMAL HIGH (ref 65–99)

## 2014-07-20 MED ORDER — FAT EMULSION 20 % IV EMUL
240.0000 mL | INTRAVENOUS | Status: DC
Start: 2014-07-20 — End: 2014-07-21
  Administered 2014-07-20: 240 mL via INTRAVENOUS
  Filled 2014-07-20: qty 250

## 2014-07-20 MED ORDER — SODIUM POLYSTYRENE SULFONATE 15 GM/60ML PO SUSP
7.5000 g | Freq: Once | ORAL | Status: AC
  Administered 2014-07-20: 7.5 g via ORAL
  Filled 2014-07-20: qty 60

## 2014-07-20 MED ORDER — DEXTROSE 5 % IV SOLN
INTRAVENOUS | Status: DC
  Administered 2014-07-20: 11:00:00 via INTRAVENOUS

## 2014-07-20 MED ORDER — TRACE MINERALS CR-CU-MN-SE-ZN 10-1000-500-60 MCG/ML IV SOLN
INTRAVENOUS | Status: DC
  Administered 2014-07-20: 19:00:00 via INTRAVENOUS
  Filled 2014-07-20: qty 1200

## 2014-07-20 NOTE — Progress Notes (Signed)
Palliative Recommendations:  1. Discontinue TPN 2. Continue Roxanol and Tylenol for pain 3. Aggressive bowel regimen, scheduled and PRN laxtives and nausea meds 4. Continue Pepcid and PPI -transition to PO form 5. Daily Senna with Colace, PRN dulcolax supp 6. She needs a prescription for Sandostatin Depot Injection LAR 10mg  X1 dose to be adminstered by hospice nurse-needs to be filled at pharmacy prior to discharge if possible-may need prior authorization. This is essential for management of her malignant obstruction. 7. She will need a home suction set-up for her venting PEG and instructions.  Will follow in AM.  Lane Hacker, [place]

## 2014-07-20 NOTE — Progress Notes (Signed)
Clinical Social Work  CSW went to room to follow up with patient to offer support during hospital stay. Patient and sister sleeping in room. CSW did not disturb. CSW will follow up at later time.  [place], [place] [phone]

## 2014-07-20 NOTE — Progress Notes (Signed)
Patient ID: Natalie Chapman, female   DOB: Mar 14, 1940, 74 y.o.   MRN: [id number] [facility] PROGRESS NOTE  LENNY BOUCHILLON XNT:[id number] DOB: [birth year] DOA: 07/10/2014 PCP: Reymundo Poll, MD  Brief narrative:    74 y.o. female with past medical history colon cancer undergoing chemotherapy, last chemotherapy 6/16, chronic kidney disease stage III, history of right-sided hydronephrosis and ureteral stent placement in 08/03/2014. Pt presented to [facility]/10/2014 with vomiting and diarrhea. C. difficile was negative. CT of the abdomen done 07/12/14 was concerning for SBO. Surgery has seen the patient in consultation with no plans for surgical intervention at this time. Palliative care has also seen the patient in consultation to address symptom management and goals of care.  Throughout his hospital course, patient underwent gastrostomy tube placement 07/19/2014 by IR with no subsequent complications. Plan is to continue clear liquid diet as tolerated. She may not need TPN on discharge.  Barrier to discharge: Plan for discharge 07/21/2014 if she continues to tolerate clear liquids.  Assessment/Plan:    Principal Problem:  Small bowel obstruction / Nausea ,vomiting / Dehydration  - CT abdomen on the admission is concerning for small bowel obstruction. This is likely from adhesions considering that patient had prior abdominal surgeries including cholecystectomy, hysterectomy and right colectomy. - Surgery has seen the patient in consultation with no plan for surgical intervention. Their recommendation is for placement off palliative gastrostomy tube. - Interventional radiology was consulted and gastrostomy tube was placed 07/19/2014 with no subsequent complications. - Nausea and vomiting is controlled. - Continue Pepcid 20 mg IV every 12 hours, Decadron 1 mg IV every 24 hours. - Patient is on nutritional support with TPN which was started 07/15/2014. She is also clear liquid diet and if she  continues to tolerated she may not need TPN on discharge. - Continue Senokot for bowel regimen. She has had bowel movement in past 24 hours.   Active Problems:  Hypophosphatemia / hypomagnesemia / hyperkalemia - Likely refeeding syndrome from total parenteral nutrition - Being supplemented. Her potassium is 5.6 this morning because she is receiving supplementation through fluids.  - We will give the smallest dose of Kayexalate, 7.5 g. Stop potassium supplementation through IV fluids.   Colon cancer- pT4a, pN2b, MX, ascending colon s/p lap assisted right colectomy 07/20/13 - Patient's last chemotherapy was 06/28/2014 with FOLFOX - CT abdomen on this admission showed possible recurrence of the malignancy. - Palliative care assisting management. Plan for patient is to go home with hospice.   Atrial fibrillation with rapid ventricular response - CHADS vasc score of at least 3 - Patient is on aspirin for anticoagulation.  - Rate controlled with metoprolol   Anti-neoplastic chemotherapy induced pancytopenia / neutropenia - Pancytopenia likely sequela of chemotherapy  - Neutropenia resolved 07/14/14. Hemoglobin stable at 8.7. Platelet count normalized. - Patient has received 2 units of PRBC transfusion on 06/28/2014.   Morbid obesity / moderate protein calorie malnutrition - Status post gastric stapling in past. Malnutrition likely secondary to small bowel obstruction and malignancy. - Body mass index is 46.29 kg/(m^2).   Right hydronephrosis - Has ureteral stent  - Voiding freely   Elevated bilirubin - Other LFT's WNL   Right middle lobe opacity noted on CT done 07/12/14 - No clinical evidence of pneumonia, only what's seen on CT abdomen   Diarrhea - C. difficile studies negative.  - Diarrhea resolved   Chronic kidney disease (CKD), stage III (moderate) - Baseline creatinine 2.2 - Creatinine has improved since the admission  Intertriginous dermatitis left  breast/skin tear intergluteal fold - Seen by wound care nurse 07/13/14.    DVT Prophylaxis  - Lovenox subQ ordered in hospital    Code Status: DNR/DNI Family Communication: plan of care discussed with the patient and her daughter-in-law at the bedside  Disposition Plan: D/C 07/21/2014   IV access:  Peripheral IV  Procedures and diagnostic studies:    Ir Gastrostomy Tube Mod Sed 07/19/2014  Fluoroscopic insertion of a 20-French "pull-through" gastrostomy.    Ct Abdomen Pelvis Wo Contrast 07/12/2014 Right middle lobe airspace opacity is noted concerning for pneumonia. Mild splenomegaly is noted. Status post cholecystectomy, hysterectomy and right colectomy. Stable soft tissue density is seen in the region of surgical site in the right lower quadrant of the anterior abdominal wall which extends into peritoneal space in the right lower quadrant around presumed position of ileocolic anastomosis. While this may simply represent postoperative scarring, recurrent or metastatic malignancy cannot be excluded. Interval development of small bowel dilatation concerning for distal small bowel obstruction. Interval placement of right-sided ureteral stent seen extending from right renal pelvis to urinary bladder.   Dg Abd 2 Views 07/12/2014 Moderately dilated left mid abdominal small bowel loops with differential air-fluid levels suggesting small bowel obstruction.  Dg Abd 2 Views 07/10/2014 Prominent stool in the distal colon. No free air.  Medical Consultants:  Oncology Palliative Care Surgery Interventional radiology  Other Consultants:  Physical therapy Nutrition  IAnti-Infectives:   None     Leisa Lenz, MD  [facility] Pager [phone]  Time spent in minutes: 25 minutes  If 7PM-7AM, please contact night-coverage [email or web address] Password [facility] 07/20/2014, 9:50 AM   LOS: 10 days    HPI/Subjective: No acute overnight events. Patient reports feeling little more  pain after the procedure yesterday. Feels better this morning.   Objective: Filed Vitals:   07/19/14 1635 07/19/14 1706 07/19/14 2148 07/20/14 0523  BP: 99/45 99/45 112/50 111/61  Pulse: 62 62 82 78  Temp: 98.3 F (36.8 C) 97.9 F (36.6 C) 97.4 F (36.3 C) 97.4 F (36.3 C)  TempSrc: Oral Oral Oral Oral  Resp: 20 20 20 18   Height:      Weight:      SpO2: 100% 100% 100% 100%    Intake/Output Summary (Last 24 hours) at 07/20/14 0950 Last data filed at 07/20/14 0740  Gross per 24 hour  Intake      0 ml  Output    450 ml  Net   -450 ml    Exam:   General:  Pt is alert, follows commands appropriately, not in acute distress  Cardiovascular:irregular rhythm, rate controlled, S1/S2 appreciated  Respiratory: Clear to auscultation bilaterally, no wheezing, no crackles, no rhonchi  Abdomen: G-tube in place with abdominal binder on top; nontender abdomen, appreciate bowel sounds  Extremities: No edema, pulses DP and PT palpable bilaterally  Neuro: Grossly nonfocal  Data Reviewed: Basic Metabolic Panel:  Recent Labs Lab 07/15/14 1723 07/16/14 0435 07/17/14 0410 07/18/14 0650 07/19/14 0508 07/20/14 0518  NA 138 139 138 135 133* 131*  K 4.4 4.3 4.1 4.8 4.6 5.6*  CL 108 107 108 106 104 104  CO2 22 22 21* 21* 23 22  GLUCOSE 108* 134* 130* 239* 133* 139*  BUN 35* 36* 36* 34* 36* 40*  CREATININE 1.80* 1.92* 1.69* 1.58* 1.64* 1.63*  CALCIUM 8.5* 8.2* 8.1* 8.1* 8.4* 8.0*  MG 1.5* 1.9 1.8 1.8 1.9  --   PHOS <1.0* 3.0 1.9* 2.6 2.5  --  Liver Function Tests:  Recent Labs Lab 07/15/14 1723 07/16/14 0435 07/17/14 0410 07/19/14 0508  AST 13* 11* 13* 13*  ALT 10* 8* 8* 10*  ALKPHOS 80 77 70 84  BILITOT 1.0 0.5 0.6 0.7  PROT 6.7 5.9* 5.7* 6.8  ALBUMIN 3.0* 2.7* 2.6* 2.9*   No results for input(s): LIPASE, AMYLASE in the last 168 hours. No results for input(s): AMMONIA in the last 168 hours. CBC:  Recent Labs Lab 07/14/14 0452 07/16/14 0435 07/19/14 0508   WBC 3.6* 6.5 9.2  NEUTROABS 2.4 5.2  --   HGB 7.8* 8.2* 8.7*  HCT 23.5* 24.9* 27.0*  MCV 94.4 95.4 93.8  PLT 220 240 285   Cardiac Enzymes: No results for input(s): CKTOTAL, CKMB, CKMBINDEX, TROPONINI in the last 168 hours. BNP: Invalid input(s): POCBNP CBG:  Recent Labs Lab 07/19/14 1645 07/19/14 1959 07/20/14 0002 07/20/14 0342 07/20/14 0804  GLUCAP 118* 126* 135* 139* 130*    Recent Results (from the past 240 hour(s))  Clostridium Difficile by PCR (not at [facility])     Status: None   Collection Time: 07/11/14  3:26 AM  Result Value Ref Range Status   C difficile by pcr NEGATIVE NEGATIVE Final     Scheduled Meds: . acetaminophen  650 mg Oral Q12H  . aspirin EC  81 mg Oral Daily  . dexamethasone  1 mg Intravenous Q24H  . enoxaparin (LOVENOX) injection  40 mg Subcutaneous Q24H  . famotidine (PEPCID) IV  20 mg Intravenous Q12H  . insulin aspart  0-9 Units Subcutaneous 6 times per day  . ipratropium  2 spray Each Nare TID  . lactose free nutrition  237 mL Oral TID WC  . metoprolol tartrate  25 mg Oral BID  . senna-docusate  1 tablet Oral Daily  . sodium polystyrene  7.5 g Oral Once   Continuous Infusions: . Marland KitchenTPN (CLINIMIX-E) Adult 50 mL/hr at 07/19/14 1715   And  . fat emulsion 240 mL (07/19/14 1716)  . dextrose 5 % and 0.45 % NaCl with KCl 20 mEq/L 35 mL/hr at 07/20/14 0518  . octreotide  (SANDOSTATIN)    IV infusion 25 mcg/hr (07/19/14 2027)

## 2014-07-20 NOTE — Care Management Note (Signed)
Case Management Note  Patient Details  Name: Natalie Chapman MRN: [id number] Date of Birth: 1940/10/23    Expected Discharge Date:                  Expected Discharge Plan:  Home w Hospice Care  In-House Referral:     Discharge planning Services  CM Consult  Post Acute Care Choice:  Hospice Choice offered to:  Adult Children (Adult Niece)  DME Arranged:  3-N-1, Walker rolling (youth-short and bariatric walker and  bariatric  3in1) DME Agency:     HH Arranged:  Disease Management [place] Agency:  [facility] [place]  Status of Service:  In process, will continue to follow  Medicare Important Message Given:  Yes-fourth notification given Date Medicare IM Given:    Medicare IM give by:    Date Additional Medicare IM Given:    Additional Medicare Important Message give by:     If discussed at [place] of Stay Meetings, dates discussed:    Additional Comments: This CM was informed by Palliative Care Md that pt and family wanted her to go home with hospice care instead of [facility]. This CM spoke with pt and sister in the room who defer choice of hospice providers to son Nicki Reaper [phone] ext. [phone]). This CM spoke with Nicki Reaper via phone who chose [facility] [place]. He states that he would like his mom to go home for a little while and then would eventually need residential hospice placement. Referral called into the Hospice referral line and contact number for son was given. CM will continue to follow. Lynnell Catalan, RN 07/20/2014, 2:02 PM

## 2014-07-20 NOTE — Progress Notes (Signed)
IP PROGRESS NOTE  Subjective:   Natalie Chapman underwent placement of a gastrostomy tube yesterday. She is alert and in a chair this morning.  Objective: Vital signs in last 24 hours: Blood pressure 92/43, pulse 83, temperature 97.2 F (36.2 C), temperature source Oral, resp. rate 16, height 5' (1.524 m), weight 237 lb (107.502 kg), SpO2 100 %.  Intake/Output from previous day: 07/07 0701 - 07/08 0700 In: -  Out: 250 [Urine:250]  Physical Exam:    Abdomen: Firm masslike fullness in the right lower abdomen, left upper quadrant gastrostomy tube site with a gauze dressing Extremities: Chronic stasis change at the low leg bilaterally   Portacath/PICC-without erythema  Lab Results:  Recent Labs  07/19/14 0508  WBC 9.2  HGB 8.7*  HCT 27.0*  PLT 285    BMET  Recent Labs  07/19/14 0508 07/20/14 0518  NA 133* 131*  K 4.6 5.6*  CL 104 104  CO2 23 22  GLUCOSE 133* 139*  BUN 36* 40*  CREATININE 1.64* 1.63*  CALCIUM 8.4* 8.0*    Studies/Results: Ir Gastrostomy Tube Mod Sed  07/19/2014   CLINICAL DATA:  Colon cancer, small-bowel obstruction, and gastrostomy for decompression  EXAM: FLUOROSCOPIC 20 FRENCH PULL-THROUGH GASTROSTOMY  Date:  7/7/20167/07/2014 4:11 pm  Radiologist:  M. Daryll Brod, MD  Guidance:  Fluoroscopic  FLUOROSCOPY TIME:  2.6 MINUTES, 71.4 MGY  MEDICATIONS AND MEDICAL HISTORY: 2 g Ancef,administered within 1 hour of the procedure,2 mg Versed, 50 mcg fentanyl  ANESTHESIA/SEDATION: 15 minutes  CONTRAST:  10 cc Omnipaque 034  COMPLICATIONS: None.  PROCEDURE: Informed consent was obtained from the patient following explanation of the procedure, risks, benefits and alternatives. The patient understands, agrees and consents for the procedure. All questions were addressed. A time out was performed.  Maximal barrier sterile technique utilized including caps, mask, sterile gowns, sterile gloves, large sterile drape, hand hygiene, and betadine prep.  The left upper quadrant  was sterilely prepped and draped. An oral gastric catheter was inserted into the stomach under fluoroscopy. The existing nasogastric feeding tube was removed. Air was injected into the stomach for insufflation and visualization under fluoroscopy. The air distended stomach was confirmed beneath the anterior abdominal wall in the frontal and lateral projections. Under sterile conditions and local anesthesia, a 52 gauge trocar needle was utilized to access the stomach percutaneously beneath the left subcostal margin. Needle position was confirmed within the stomach under biplane fluoroscopy. Contrast injection confirmed position also. A single T tack was deployed for gastropexy. Over an Amplatz guide wire, a 9-French sheath was inserted into the stomach. A snare device was utilized to capture the oral gastric catheter. The snare device was pulled retrograde from the stomach up the esophagus and out the oropharynx. The 20-French pull-through gastrostomy was connected to the snare device and pulled antegrade through the oropharynx down the esophagus into the stomach and then through the percutaneous tract external to the patient. The gastrostomy was assembled externally. Contrast injection confirms position in the stomach. Images were obtained for documentation. The patient tolerated procedure well. No immediate complication.  IMPRESSION: Fluoroscopic insertion of a 20-French "pull-through" gastrostomy.   Electronically Signed   By: Jerilynn Mages.  Shick M.D.   On: 07/19/2014 16:33    Medications: I have reviewed the patient's current medications.  Assessment/Plan: 1. 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 8 of 15 lymph nodes contained metastatic carcinoma, tumor deposits were present; microsatellite stable.  PET scan 08/24/2013 with mildly hypermetabolic small left axillary and right inguinal nodes--most likely unrelated  to colon cancer, no clear evidence of metastatic disease.   Cycle 1 adjuvant Xeloda 09/05/2013  Cycle 2 adjuvant Xeloda 09/26/2013  Cycle 3 adjuvant Xeloda 10/17/2013  Cycle 4 adjuvant Xeloda 11/07/2013  Cycle 5 adjuvant Xeloda 11/28/2013  Cycle 6 adjuvant Xeloda 12/19/2013  Cycle 7 adjuvant Xeloda 01/09/2014  Cycle 8 adjuvant Xeloda 01/30/2014  Status post biopsy of nodularity at the right lower quadrant scar on 03/27/2014 confirming metastatic colon cancer  CT 04/05/2014 with new right hydronephrosis, soft tissue thickening adjacent to the ileocolonic anastomosis and nodularity in the overlying subcutaneous tissue  PET scan 05/02/2014 with hypermetabolic soft tissue adjacent to the ileocolic anastomosis beneath the right abdominal wall. Suspected soft tissue metastasis in the right abdominal wall, peritoneal disease along the right liver and pelvic nodal metastases. Suspected right pelvic implant. Associated mild right hydronephrosis with indwelling ureteral stent.  Cycle 1 FOLFOX 05/17/2014  Cycle 2 FOLFOX 05/31/2014  Cycle 3 FOLFOX 06/14/2014  Cycle 4 FOLFOX 06/28/2014 2. Atrial fibrillation 3. Anemia secondary to bleeding from the colon cancer,renal insufficiency, and chemotherapy-status post 2 units of packed red blood cells beginning 06/28/2014 4. History of tobacco use with COPD changes on the CT 04/07/2013  5. Morbid obesity  6. Status post gastric banding surgery  7. Renal insufficiency  8. Anorexia-etiology unclear. Improved 9. Right hydronephrosis on the CT 04/05/2014. She was referred to urology and underwent placement of a ureter stent on 04/27/2014. Renal function has improved. 10. Port-A-Cath placement 05/15/2014 11. Nausea/vomiting, diarrhea, and dehydration-the plain x-ray and CT on 07/12/2014 is consistent with a partial small bowel obstruction  NG tube placed 07/15/2014, removed 07/17/2014  Gastrostomy tube placed 07/19/2014 12. 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 The obstructive symptoms have improved, but are likely to recur. She had a gastrostomy tube placed yesterday. She appears stable for discharge to home or a skilled nursing facility with Hospice care.    Recommendations:  1. Liquid diet 2. Home with Hospice if she is able to ambulate, skilled nursing facility placement if she is not ambulatory 3. Please call oncology as needed. I will schedule outpatient follow-up and I will be glad to follow her with the [facility] program.     LOS: 10 days   Juanangel Soderholm, Dominica Severin  07/20/2014, 2:44 PM

## 2014-07-20 NOTE — Progress Notes (Signed)
Sore around g-tube site.  G-tube site dressing is dry.  I recommended discharging her to home with hospice care.  Liquids po ad lib.  Vent g-tube prn n/v.

## 2014-07-20 NOTE — Progress Notes (Signed)
Chaplain visited patient during rounds. Chaplain provided emotional support and comfort to patient. Patient became teary eyed as she spoke of wanting to leave the hospital to go home. Chaplain prayed a prayer of comfort. Chaplain will continue to follow.   07/20/14 1100  Clinical Encounter Type  Visited With Patient and family together  Visit Type Follow-up;Spiritual support;Social support  Referral From Palliative care team

## 2014-07-20 NOTE — Progress Notes (Signed)
Chaplain peaked in while rounding and discovered that the patient and family member were asleep. Chaplain will come back another time. Chaplain services are available when needed.    07/19/14 1145  Clinical Encounter Type  Visited With Patient and family together  Visit Type Follow-up;Spiritual support;Social support  Referral From Palliative care team

## 2014-07-20 NOTE — Progress Notes (Signed)
PARENTERAL NUTRITION CONSULT NOTE - Follow-Up  Pharmacy Consult for TPN Indication: Small bowel obstruction  No Known Allergies  Patient Measurements: Height: 5' (152.4 cm) Weight: 237 lb (107.502 kg) IBW/kg (Calculated) : 45.5 Adjusted Body Weight: 61 kg  Vital Signs: Temp: 97.4 F (36.3 C) (07/08 0523) Temp Source: Oral (07/08 0523) BP: 111/61 mmHg (07/08 0523) Pulse Rate: 78 (07/08 0523) Intake/Output from previous day: 07/07 0701 - 07/08 0700 In: -  Out: 250 [Urine:250] Intake/Output from this shift: Total I/O In: -  Out: 200 [Urine:200]  Labs:  Recent Labs  07/19/14 0508  WBC 9.2  HGB 8.7*  HCT 27.0*  PLT 285  APTT 30  INR 1.09    Recent Labs  07/18/14 0650 07/19/14 0508 07/20/14 0518  NA 135 133* 131*  K 4.8 4.6 5.6*  CL 106 104 104  CO2 21* 23 22  GLUCOSE 239* 133* 139*  BUN 34* 36* 40*  CREATININE 1.58* 1.64* 1.63*  CALCIUM 8.1* 8.4* 8.0*  MG 1.8 1.9  --   PHOS 2.6 2.5  --   PROT  --  6.8  --   ALBUMIN  --  2.9*  --   AST  --  13*  --   ALT  --  10*  --   ALKPHOS  --  84  --   BILITOT  --  0.7  --    Estimated Creatinine Clearance: 33.6 mL/min (by C-G formula based on Cr of 1.63).   Recent Labs  07/20/14 0002 07/20/14 0342 07/20/14 0804  GLUCAP 135* 139* 130*   Medical History: Past Medical History  Diagnosis Date  . Hypertension   . Anemia   . H/O hiatal hernia   . Chronic atrial fibrillation     CARDIOLOGIST-- DR Johnsie Cancel  . Arthritis     LEFT HIP  . Generalized weakness   . Colon cancer dx mar  2015    Stage IIIc, pT4a  pN2b, moderate differentiated adenocarcinoma right colon with 8of15 +lymph nodes for metastatic carcinoma--  s/p  right colectomy 07-20-2013;  chemotherapy 09-05-2013 to 01-30-2014  . 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 Poor appetite   . Frequency of urination   . Wears glasses   . Full dentures   . Dysrhythmia     AFIB  . Chronic kidney disease (CKD), stage I   . Hydronephrosis, right   . Seroma,  postoperative 07/27/2013   Medications:  Scheduled:  . acetaminophen  650 mg Oral Q12H  . aspirin EC  81 mg Oral Daily  . dexamethasone  1 mg Intravenous Q24H  . enoxaparin (LOVENOX) injection  40 mg Subcutaneous Q24H  . famotidine (PEPCID) IV  20 mg Intravenous Q12H  . insulin aspart  0-9 Units Subcutaneous 6 times per day  . ipratropium  2 spray Each Nare TID  . lactose free nutrition  237 mL Oral TID WC  . metoprolol tartrate  25 mg Oral BID  . senna-docusate  1 tablet Oral Daily  . sodium polystyrene  7.5 g Oral Once   Infusions:  . Marland KitchenTPN (CLINIMIX-E) Adult 50 mL/hr at 07/19/14 1715   And  . fat emulsion 240 mL (07/19/14 1716)  . dextrose    . octreotide  (SANDOSTATIN)    IV infusion 25 mcg/hr (07/19/14 2027)   PRN: albuterol, bisacodyl, labetalol, lidocaine-prilocaine, morphine injection, morphine CONCENTRATE, ondansetron (ZOFRAN) IV, phenol  Insulin Requirements: 4 units SSI  Current Nutrition:  Clear liquids, Boost Plus TID (not taking) with meals  IVF: D5 1/2 NS w/20 mEq KCl/L at 35 ml/hr  Central access: implanted port TPN start date: 7/3  ASSESSMENT                                                                                                          HPI:  74 yo female with colon cancer undergoing chemotherapy with FOLFOX, most recent treatment on 06/28/14, admitted 07/10/14 with vomiting and diarrhea; CT of the abdomen done 07/12/14 concerning for SBO. [facility] consulted, no need for urgent surgical intervention, but may require a palliative ileostomy for which she would be high risk.  They recommend to consider TPN given severe PCM and high likelihood of it not improving anytime soon.  Significant events:  7/3: Pharmacy consulted to begin TPN. Patient has implanted port for chemotherapy which can be used for TPN.  7/4: NG tube draining dark bilious material; considering palliative gastrostomy tube. 7/5: TPN advanced to goal. Octreotide drip started. 7/6:  NG tube came  out. Tolerating clear liquids and advanced to FLD. Surgery recommending palliative gastrostomy tube per IR. BM x1 7/7: NPO this AM for G-tube placement. Use G-tube 7/8 for venting if N/V 7/8: Probable discharge 7/9, waiting for decision per TPN at home  Today 07/20/2014:  Glucose: CBGs now improved to goal (<150) after SSI resumed.  Electrolytes: Na remains slightly low, K appears elevated-hydrolyzed sample. Corr Ca 8.8. Phos, Mg WNL yesterday  Renal: CKD III, SCr improving, stable overnight.   LFTs: AST/ALT low, stable. Alk Phos and T Bili WNL.   TGs: 83 (7/4)  Prealbumin: 6.2 (7/2), 6.1 (7/4)  NUTRITIONAL GOALS                                                                                             RD recs:  Kcal: 2000-2200, Protein: 85-95 grams  Per TPN guidelines for patients with CKD, will restrict protein to ~1g/kg/day based on adjusted body weight, making goals Clinimix E 5/20 at a goal rate of 60m/hr + 20% fat emulsion at 159mhr to provide: 60g/day protein, 1536Kcal/day.  PLAN  At 1800 today:  Cont Clinimix E 5/20 to goal 50 ml/hr per new goals above based on limiting protein per guidelines secondary to CKD.  Cont 20% fat emulsion at 10 ml/hr.  TPN to contain standard multivitamins and trace elements.  Continue IVF at 35 ml/hr. MD: consider changing dextrose to saline  Resume SSI q6h.  BMET tomorrow - monitor Potassium  TPN lab panels on Mondays & Thursdays.  F/u plans for discharge (probably 7/9) and weaning TNA. MD notes state "she may not need TPN on discharge". No wean today, may use G-tube  Minda Ditto PharmD Pager [phone] 07/20/2014, 10:52 AM

## 2014-07-20 NOTE — Progress Notes (Signed)
PT Cancellation Note  Patient Details Name: Natalie Chapman MRN: [id number] DOB: 10/23/1940   Cancelled Treatment:     pt soundly sleeping in recliner chair and family member soundly sleeping in the bed.  Did not disturb.  D/C plan is for home with Hospice.    Nathanial Rancher 07/20/2014, 3:15 PM

## 2014-07-20 NOTE — Progress Notes (Signed)
Per order G tube flushed with 60 ml of water.

## 2014-07-21 LAB — GLUCOSE, CAPILLARY
GLUCOSE-CAPILLARY: 91 mg/dL (ref 65–99)
GLUCOSE-CAPILLARY: 122 mg/dL — AB (ref 65–99)
Glucose-Capillary: 100 mg/dL — ABNORMAL HIGH (ref 65–99)
Glucose-Capillary: 135 mg/dL — ABNORMAL HIGH (ref 65–99)

## 2014-07-21 LAB — BASIC METABOLIC PANEL
ANION GAP: 6 (ref 5–15)
BUN: 44 mg/dL — ABNORMAL HIGH (ref 6–20)
CALCIUM: 7.7 mg/dL — AB (ref 8.9–10.3)
CO2: 21 mmol/L — ABNORMAL LOW (ref 22–32)
Chloride: 100 mmol/L — ABNORMAL LOW (ref 101–111)
Creatinine, Ser: 1.63 mg/dL — ABNORMAL HIGH (ref 0.44–1.00)
GFR calc Af Amer: 35 mL/min — ABNORMAL LOW (ref >60)
GFR calc non Af Amer: 30 mL/min — ABNORMAL LOW (ref >60)
GLUCOSE: 126 mg/dL — AB (ref 65–99)
POTASSIUM: 4.9 mmol/L (ref 3.5–5.1)
Sodium: 127 mmol/L — ABNORMAL LOW (ref 135–145)

## 2014-07-21 MED ORDER — BISACODYL 10 MG RE SUPP: 10.0000 mg | Freq: Every day | RECTAL | Status: AC | PRN

## 2014-07-21 MED ORDER — BOOST PLUS PO LIQD: 237.0000 mL | Freq: Three times a day (TID) | ORAL | Status: AC

## 2014-07-21 MED ORDER — METOPROLOL TARTRATE 25 MG PO TABS: 25.0000 mg | ORAL_TABLET | Freq: Two times a day (BID) | ORAL | Status: AC

## 2014-07-21 MED ORDER — IPRATROPIUM BROMIDE 0.06 % NA SOLN: 2.0000 | Freq: Three times a day (TID) | NASAL | Status: AC

## 2014-07-21 MED ORDER — HEPARIN SOD (PORK) LOCK FLUSH 100 UNIT/ML IV SOLN
500.0000 [IU] | INTRAVENOUS | Status: DC | PRN
  Filled 2014-07-21: qty 5

## 2014-07-21 MED ORDER — HEPARIN SOD (PORK) LOCK FLUSH 100 UNIT/ML IV SOLN
500.0000 [IU] | INTRAVENOUS | Status: DC
  Administered 2014-07-21: 500 [IU]

## 2014-07-21 MED ORDER — DEXAMETHASONE 1 MG PO TABS: 1.0000 mg | ORAL_TABLET | Freq: Every day | ORAL | Status: AC

## 2014-07-21 MED ORDER — ACETAMINOPHEN 325 MG PO TABS: 650.0000 mg | ORAL_TABLET | Freq: Two times a day (BID) | ORAL | Status: AC

## 2014-07-21 MED ORDER — ONDANSETRON 4 MG PO TBDP: 4.0000 mg | ORAL_TABLET | Freq: Three times a day (TID) | ORAL | Status: AC | PRN

## 2014-07-21 MED ORDER — DOCUSATE SODIUM 100 MG PO CAPS: 100.0000 mg | ORAL_CAPSULE | Freq: Two times a day (BID) | ORAL | Status: AC

## 2014-07-21 MED ORDER — OCTREOTIDE ACETATE 10 MG IM KIT: 10.0000 mg | PACK | Freq: Once | INTRAMUSCULAR | Status: AC

## 2014-07-21 MED ORDER — FAMOTIDINE 20 MG PO TABS: 20.0000 mg | ORAL_TABLET | Freq: Two times a day (BID) | ORAL | Status: AC

## 2014-07-21 MED ORDER — MORPHINE SULFATE (CONCENTRATE) 10 MG/0.5ML PO SOLN: 5.0000 mg | ORAL | Status: AC | PRN

## 2014-07-21 MED ORDER — SENNA 8.6 MG PO TABS: 1.0000 | ORAL_TABLET | Freq: Every day | ORAL | Status: AC

## 2014-07-21 NOTE — Progress Notes (Signed)
Patient d/c home. Stable. 

## 2014-07-21 NOTE — Progress Notes (Signed)
IR follow up of G-tube placed 7/7 Pt had some pain at site, but better this am. Has drained some thin green bilious output when drained.  G-tube intact, site clean, dry. No signs of leakage or skin irritation.  Discussed with pt that small amount pain/soreness expected for a few days. Went over a few instructions as far as care such as keeping bumper snug. May put single layer drain gauze under bumper to absorb moisture. Since this is primarily venting G-tube, her need to drain(either by gravity or by suction) will likely be based on pt sxs. Discussed with pt daughter that some pts may vent prior to going to bed or upon waking, and then if/when they feel full or nauseous.  [facility]/Hospice should be able to further educate pt/family and help them develop a routine.  Ascencion Dike PA-C Interventional Radiology 07/21/2014 8:59 AM

## 2014-07-21 NOTE — Progress Notes (Signed)
Notified by [place] of family request for Hospice and [place] services at home after discharge. Chart and patient Information currently under review to confirm hospice eligibility.  Spoke with sister, at bedside to initiate education related to hospice philosophy, services and team approach to care. Family verbalized understanding of the information provided. Per discussion plan is for discharge to home by personal vehicle with sister and daughter today.  Please send signed completed DNR form home with patient. Patient will need prescriptions for discharge comfort medications.  DME needs discussed and family denied a need at this time.  Completed discharge summary will need to be faxed to [facility] at [phone] when final. Please notify [facility] when patient is ready to leave unit at discharge-call [phone].  [facility] information and contact numbers have been given to sister during visit.  Please call with any questions.  Annia Belt RN, [facility] Liaison [phone]

## 2014-07-21 NOTE — Discharge Instructions (Signed)
Gastrostomy Tube Home Guide  A gastrostomy tube is a tube that is surgically placed into the stomach. It is also called a "G-tube." G-tubes are used when a person is unable to eat and drink enough on their own to stay healthy. The tube is inserted into the stomach through a small cut (incision) in the skin. This tube is used for:   Feeding.   Giving medication.  GASTROSTOMY TUBE CARE   Wash your hands with soap and water.   Remove the old dressing (if any). Some styles of G-tubes may need a dressing inserted between the skin and the G-tube. Other types of G-tubes do not require a dressing. Ask your health care provider if a dressing is needed.   Check the area where the tube enters the skin (insertion site) for redness, swelling, or pus-like (purulent) drainage. A small amount of clear or tan liquid drainage is normal. Check to make sure scar tissue (skin) is not growing around the insertion site. This could have a raised, bumpy appearance.   A cotton swab can be used to clean the skin around the tube:   When the G-tube is first put in, a normal saline solution or water can be used to clean the skin.   Mild soap and warm water can be used when the skin around the G-tube site has healed.   Roll the cotton swab around the G-tube insertion site to remove any drainage or crusting at the insertion site.  STOMACH RESIDUALS  Feeding tube residuals are the amount of liquids that are in the stomach at any given time. Residuals may be checked before giving feedings, medications, or as instructed by your health care provider.   Ask your health care provider if there are instances when you would not start tube feedings depending on the amount or type of contents withdrawn from the stomach.   Check residuals by attaching a syringe to the G-tube and pulling back on the syringe plunger. Note the amount, and return the residual back into the stomach.  FLUSHING THE G-TUBE   The G-tube should be periodically flushed with  clean warm water to keep it from clogging.   Flush the G-tube after feedings or medications. Draw up 30 mL of warm water in a syringe. Connect the syringe to the G-tube and slowly push the water into the tube.   Do not push feedings, medications, or flushes rapidly. Flush the G-tube gently and slowly.   Only use syringes made for G-tubes to flush medications or feedings.   Your health care provider may want the G-tube flushed more often or with more water. If this is the case, follow your health care provider's instructions.  FEEDINGS  Your health care provider will determine whether feedings are given as a bolus (a certain amount given at one time and at scheduled times) or whether feedings will be given continuously on a feeding pump.    Formulas should be given at room temperature.   If feedings are continuous, no more than 4 hours worth of feedings should be placed in the feeding bag. This helps prevent spoilage or accidental excess infusion.   Cover and place unused formula in the refrigerator.   If feedings are continuous, stop the feedings when medications or flushes are given. Be sure to restart the feedings.   Feeding bags and syringes should be replaced as instructed by your health care provider.  GIVING MEDICATION    In general, it is best if all medications   are in a liquid form for G-tube administration. Liquid medications are less likely to clog the G-tube.   Mix the liquid medication with 30 mL (or amount recommended by your health care provider) of warm water.   Draw up the medication into the syringe.   Attach the syringe to the G-tube and slowly push the mixture into the G-tube.   After giving the medication, draw up 30 mL of warm water in the syringe and slowly flush the G-tube.   For pills or capsules, check with your health care provider first before crushing medications. Some pills are not effective if they are crushed. Some capsules are sustained-release medications.   If  appropriate, crush the pill or capsule and mix with 30 mL of warm water. Using the syringe, slowly push the medication through the tube, then flush the tube with another 30 mL of tap water.  G-TUBE PROBLEMS  G-tube was pulled out.   Cause: May have been pulled out accidentally.   Solutions: Cover the opening with clean dressing and tape. Call your health care provider right away. The G-tube should be put in as soon as possible (within 4 hours) so the G-tube opening (tract) does not close. The G-tube needs to be put in at a health care setting. An X-ray needs to be done to confirm placement before the G-tube can be used again.  Redness, irritation, soreness, or foul odor around the gastrostomy site.   Cause: May be caused by leakage or infection.   Solutions: Call your health care provider right away.  Large amount of leakage of fluid or mucus-like liquid present (a large amount means it soaks clothing).   Cause: Many reasons could cause the G-tube to leak.   Solutions: Call your health care provider to discuss the amount of leakage.  Skin or scar tissue appears to be growing where tube enters skin.    Cause: Tissue growth may develop around the insertion site if the G-tube is moved or pulled on excessively.   Solutions: Secure tube with tape so that excess movement does not occur. Call your health care provider.  G-tube is clogged.   Cause: Thick formula or medication.   Solutions: Try to slowly push warm water into the tube with a large syringe. Never try to push any object into the tube to unclog it. Do not force fluid into the G-tube. If you are unable to unclog the tube, call your health care provider right away.  TIPS   Head of bed (HOB) position refers to the upright position of a person's upper body.   When giving medications or a feeding bolus, keep the HOB up as told by your health care provider. Do this during the feeding and for 1 hour after the feeding or medication administration.   If  continuous feedings are being given, it is best to keep the HOB up as told by your health care provider. When ADLs (activities of daily living) are performed and the HOB needs to be flat, be sure to turn the feeding pump off. Restart the feeding pump when the HOB is returned to the recommended height.   Do not pull or put tension on the tube.   To prevent fluid backflow, kink the G-tube before removing the cap or disconnecting a syringe.   Check the G-tube length every day. Measure from the insertion site to the end of the G-tube. If the length is longer than previous measurements, the tube may be coming out. Call   your health care provider if you notice increasing G-tube length.   Oral care, such as brushing teeth, must be continued.   You may need to remove excess air (vent) from the G-tube. Your health care provider will tell you if this is needed.   Always call your health care provider if you have questions or problems with the G-tube.  SEEK IMMEDIATE MEDICAL CARE IF:    You have severe abdominal pain, tenderness, or abdominal bloating (distension).   You have nausea or vomiting.   You are constipated or have problems moving your bowels.   The G-tube insertion site is red, swollen, has a foul smell, or has yellow or brown drainage.   You have difficulty breathing or shortness of breath.   You have a fever.   You have a large amount of feeding tube residuals.   The G-tube is clogged and cannot be flushed.  MAKE SURE YOU:    Understand these instructions.   Will watch your condition.   Will get help right away if you are not doing well or get worse.  Document Released: 03/09/2001 Document Revised: 05/15/2013 Document Reviewed: 09/05/2012  Exit[employer] Patient Information 2015 [employer]. This information is not intended to replace advice given to you by your health care provider. Make sure you discuss any questions you have with your health care provider.

## 2014-07-21 NOTE — Discharge Summary (Signed)
Physician Discharge Summary  Natalie Chapman OHY:[id number] DOB: 1940-03-17 DOA: 07/10/2014  PCP: Reymundo Poll, MD  Admit date: 07/10/2014 Discharge date: 07/21/2014  Recommendations for Outpatient Follow-up:  Since this is primarily venting G-tube, patient need to drain (either by gravity or by suction) will likely be based on symptoms. IR discussed with patint's daughter that some patiens may vent prior to going to bed or upon waking, and then if/when they feel full or nauseous.  Discharge Diagnoses:  Principal Problem:   Small bowel obstruction Active Problems:   Colon cancer- pT4a, pN2b, MX, ascending colon s/p lap assisted right colectomy 07/20/13   Atrial fibrillation   Morbid obesity   Anemia   Dehydration   Diarrhea   Chronic kidney disease (CKD), stage III (moderate)   Antineoplastic chemotherapy induced pancytopenia   Elevated bilirubin   Atrial fibrillation with RVR   Neutropenia   Hydronephrosis, right   Nausea & vomiting   Tear of skin of buttock   Intertriginous dermatitis   Pressure ulcer   Encounter for palliative care   Hypophosphatemia   Hypomagnesemia   Malnutrition of moderate degree   SBO (small bowel obstruction)   Palliative care by specialist    Discharge Condition: stable   Diet recommendation: as tolerated   History of present illness:  74 y.o. female with past medical history colon cancer undergoing chemotherapy, last chemotherapy 6/16, chronic kidney disease stage III, history of right-sided hydronephrosis and ureteral stent placement in 08/03/2014. Pt presented to [facility] 07/10/2014 with vomiting and diarrhea. C. difficile was negative. CT of the abdomen done 07/12/14 was concerning for SBO. Surgery has seen the patient in consultation with no plans for surgical intervention at this time. Palliative care has also seen the patient in consultation to address symptom management and goals of care.  Throughout his hospital course, patient underwent  gastrostomy tube placement 07/19/2014 by IR with no subsequent complications. Plan is to continue clear liquid diet or full liquids depending what she tolerates PO.  Hospital Course:   Assessment/Plan:    Principal Problem:  Small bowel obstruction / Nausea ,vomiting / Dehydration  - CT abdomen on the admission is concerning for small bowel obstruction. This is likely from adhesions considering that patient had prior abdominal surgeries including cholecystectomy, hysterectomy and right colectomy. - Surgery has seen the patient in consultation with no plan for surgical intervention. Their recommendation was for placement off palliative gastrostomy tube which was done by IR 07/19/2014. - Continue Pepcid 20 mg PO every 12 hours, Decadron 1 mg PO every 24 hours. - Patient is on nutritional support with TPN which was started 07/15/2014 but will stop it today. - Colace and senna for bowel regimen on discharge, bisacodyl suppository prescribed as well.   Active Problems:  Hypophosphatemia / hypomagnesemia / hyperkalemia / Hyponatremia - Likely refeeding syndrome from total parenteral nutrition - Hyperkalemia from yesterday corrected with small dose kayexalate given 7/8.   Colon cancer- pT4a, pN2b, MX, ascending colon s/p lap assisted right colectomy 07/20/13 - Patient's last chemotherapy was 06/28/2014 with FOLFOX - CT abdomen on this admission showed possible recurrence of the malignancy. - home with hospice    Atrial fibrillation with rapid ventricular response - CHADS vasc score of at least 3 - Patient is on aspirin for anticoagulation.  - Rate controlled with metoprolol   Anti-neoplastic chemotherapy induced pancytopenia / neutropenia - Pancytopenia likely sequela of chemotherapy  - Neutropenia resolved 07/14/14. Hemoglobin stable at 8.7. Platelet count normalized. - Patient has received 2  units of PRBC transfusion on 06/28/2014.   Morbid obesity / moderate protein calorie  malnutrition - Status post gastric stapling in past. Malnutrition likely secondary to small bowel obstruction and malignancy. - Body mass index is 46.29 kg/(m^2).   Right hydronephrosis - Has ureteral stent  - Voiding freely   Elevated bilirubin - Other LFT's WNL   Right middle lobe opacity noted on CT done 07/12/14 - No clinical evidence of pneumonia, only what's seen on CT abdomen   Diarrhea - C. difficile studies negative.  - Diarrhea resolved   Chronic kidney disease (CKD), stage III (moderate) - Baseline creatinine 2.2 - Creatinine has improved and remained stable since the admission    Intertriginous dermatitis left breast/skin tear intergluteal fold - Seen by wound care nurse 07/13/14.    DVT Prophylaxis  - Lovenox subQ ordered in hospital    Code Status: DNR/DNI Family Communication: plan of care discussed with the patient and her daughter-in-law at the bedside    IV access:  Peripheral IV  Procedures and diagnostic studies:   Ir Gastrostomy Tube Mod Sed 07/19/2014 Fluoroscopic insertion of a 20-French "pull-through" gastrostomy.   Ct Abdomen Pelvis Wo Contrast 07/12/2014 Right middle lobe airspace opacity is noted concerning for pneumonia. Mild splenomegaly is noted. Status post cholecystectomy, hysterectomy and right colectomy. Stable soft tissue density is seen in the region of surgical site in the right lower quadrant of the anterior abdominal wall which extends into peritoneal space in the right lower quadrant around presumed position of ileocolic anastomosis. While this may simply represent postoperative scarring, recurrent or metastatic malignancy cannot be excluded. Interval development of small bowel dilatation concerning for distal small bowel obstruction. Interval placement of right-sided ureteral stent seen extending from right renal pelvis to urinary bladder.   Dg Abd 2 Views 07/12/2014 Moderately dilated left mid abdominal small  bowel loops with differential air-fluid levels suggesting small bowel obstruction.  Dg Abd 2 Views 07/10/2014 Prominent stool in the distal colon. No free air.  Medical Consultants:  Oncology Palliative Care Surgery Interventional radiology  Other Consultants:  Physical therapy Nutrition  IAnti-Infectives:   None    Signed:  Leisa Lenz, MD  [facility] 07/21/2014, 10:40 AM  Pager #: [phone]  Time spent in minutes: more than 30 minutes   Discharge Exam: Filed Vitals:   07/21/14 0952  BP: 98/46  Pulse: 86  Temp:   Resp:    Filed Vitals:   07/20/14 1409 07/20/14 1900 07/21/14 0431 07/21/14 0952  BP:  118/54 98/63 98/46   Pulse:  89 76 86  Temp:  97.4 F (36.3 C) 97.2 F (36.2 C)   TempSrc:  Oral Oral   Resp:  16 16   Height:      Weight:      SpO2: 100%  100%     General: Pt is alert, follows commands appropriately, not in acute distress Cardiovascular: Regular rate and rhythm, S1/S2 +, no murmurs Respiratory: Clear to auscultation bilaterally, no wheezing, no crackles, no rhonchi Abdominal: g tube in place, (+) BS Extremities: no edema, no cyanosis, pulses palpable bilaterally DP and PT Neuro: Grossly nonfocal  Discharge Instructions  Discharge Instructions    Call MD for:  difficulty breathing, headache or visual disturbances    Complete by:  As directed      Call MD for:  persistant nausea and vomiting    Complete by:  As directed      Call MD for:  severe uncontrolled pain    Complete  by:  As directed      Diet - low sodium heart healthy    Complete by:  As directed      Discharge instructions    Complete by:  As directed   Since this is primarily venting G-tube, patient need to drain (either by gravity or by suction) will likely be based on symptoms. IR discussed with patint's daughter that some patiens may vent prior to going to bed or upon waking, and then if/when they feel full or nauseous.     Increase activity  slowly    Complete by:  As directed             Medication List    STOP taking these medications        atorvastatin 10 MG tablet  Commonly known as:  LIPITOR     metoprolol succinate 25 MG 24 hr tablet  Commonly known as:  TOPROL-XL     polyethylene glycol packet  Commonly known as:  MIRALAX / GLYCOLAX     prochlorperazine 5 MG tablet  Commonly known as:  COMPAZINE      TAKE these medications        acetaminophen 650 MG CR tablet  Commonly known as:  TYLENOL  Take 1,300 mg by mouth every morning. Arthritis pain     acetaminophen 325 MG tablet  Commonly known as:  TYLENOL  Take 2 tablets (650 mg total) by mouth every 12 (twelve) hours.     aspirin EC 81 MG tablet  Take 81 mg by mouth daily.     bisacodyl 10 MG suppository  Commonly known as:  DULCOLAX  Place 1 suppository (10 mg total) rectally daily as needed for severe constipation.     dexamethasone 1 MG tablet  Commonly known as:  DECADRON  Take 1 tablet (1 mg total) by mouth daily with breakfast.     docusate sodium 100 MG capsule  Commonly known as:  COLACE  Take 1 capsule (100 mg total) by mouth 2 (two) times daily.     famotidine 20 MG tablet  Commonly known as:  PEPCID  Take 1 tablet (20 mg total) by mouth 2 (two) times daily.     ipratropium 0.06 % nasal spray  Commonly known as:  ATROVENT  Place 2 sprays into both nostrils 3 (three) times daily.     lactose free nutrition Liqd  Take 237 mLs by mouth 3 (three) times daily with meals.     lidocaine-prilocaine cream  Commonly known as:  EMLA  Apply 1 application topically as needed. APPLY TO PORTACATH 1-2 HOURS PRIOR TO USE     metoprolol tartrate 25 MG tablet  Commonly known as:  LOPRESSOR  Take 1 tablet (25 mg total) by mouth 2 (two) times daily.     morphine CONCENTRATE 10 MG/0.5ML Soln concentrated solution  Place 0.25 mLs (5 mg total) under the tongue every 2 (two) hours as needed for moderate pain, severe pain, anxiety or shortness of  breath.     MULTIVITAMIN/IRON PO  Take 1 tablet by mouth daily.     octreotide 10 MG injection  Commonly known as:  SANDOSTATIN LAR DEPOT  Inject 10 mg into the muscle once.     ondansetron 4 MG disintegrating tablet  Commonly known as:  ZOFRAN ODT  Take 1 tablet (4 mg total) by mouth every 8 (eight) hours as needed for nausea or vomiting.     senna 8.6 MG Tabs tablet  Commonly known as:  SENOKOT  Take 1 tablet (8.6 mg total) by mouth daily.           Follow-up Information    Follow up with Reymundo Poll, MD. Schedule an appointment as soon as possible for a visit in 2 weeks.   Specialty:  Family Medicine   Why:  Follow up appt after recent hospitalization   Contact information:   [address] [place] [id number] [phone]        The results of significant diagnostics from this hospitalization (including imaging, microbiology, ancillary and laboratory) are listed below for reference.    Significant Diagnostic Studies: Ct Abdomen Pelvis Wo Contrast  07/12/2014   CLINICAL DATA:  Acute generalized abdominal pain.  EXAM: CT ABDOMEN AND PELVIS WITHOUT CONTRAST  TECHNIQUE: Multidetector CT imaging of the abdomen and pelvis was performed following the standard protocol without IV contrast.  COMPARISON:  CT scan of April 05, 2014.  FINDINGS: Severe multilevel degenerative disc disease is noted in the lumbar spine. Minimal right pleural effusion is noted. Right middle lobe airspace opacity is noted concerning for pneumonia.  Status post cholecystectomy. Mild splenomegaly is noted. Visualized portion of pancreas appears normal. Atherosclerosis of abdominal aorta is noted without aneurysm formation. Left adrenal gland is not well visualized. Right adrenal gland appears normal. Moderate right hydronephrosis is noted. Ureteral stent is seen extending from inferior portion of right renal pelvis to urinary bladder. Prominent left extrarenal pelvis is noted without ureteral  dilatation. There is interval development of small bowel dilatation concerning for distal obstruction. Status post right colectomy. Stable soft tissue density is seen in surgical site in right lower quadrant of anterior abdominal wall, which appears to extend into the peritoneal space in the right lower quadrant. No abnormal fluid collection is noted. Urinary bladder is decompressed. Status post hysterectomy.  IMPRESSION: Right middle lobe airspace opacity is noted concerning for pneumonia.  Mild splenomegaly is noted.  Status post cholecystectomy, hysterectomy and right colectomy. Stable soft tissue density is seen in the region of surgical site in the right lower quadrant of the anterior abdominal wall which extends into peritoneal space in the right lower quadrant around presumed position of ileocolic anastomosis. While this may simply represent postoperative scarring, recurrent or metastatic malignancy cannot be excluded.  Interval development of small bowel dilatation concerning for distal small bowel obstruction.  Interval placement of right-sided ureteral stent seen extending from right renal pelvis to urinary bladder.   Electronically Signed   By: Marijo Conception, M.D.   On: 07/12/2014 17:37   Ir Gastrostomy Tube Mod Sed  07/19/2014   CLINICAL DATA:  Colon cancer, small-bowel obstruction, and gastrostomy for decompression  EXAM: FLUOROSCOPIC 20 FRENCH PULL-THROUGH GASTROSTOMY  Date:  7/7/20167/07/2014 4:11 pm  Radiologist:  M. Daryll Brod, MD  Guidance:  Fluoroscopic  FLUOROSCOPY TIME:  2.6 MINUTES, 71.4 MGY  MEDICATIONS AND MEDICAL HISTORY: 2 g Ancef,administered within 1 hour of the procedure,2 mg Versed, 50 mcg fentanyl  ANESTHESIA/SEDATION: 15 minutes  CONTRAST:  10 cc Omnipaque 350  COMPLICATIONS: None.  PROCEDURE: Informed consent was obtained from the patient following explanation of the procedure, risks, benefits and alternatives. The patient understands, agrees and consents for the procedure. All  questions were addressed. A time out was performed.  Maximal barrier sterile technique utilized including caps, mask, sterile gowns, sterile gloves, large sterile drape, hand hygiene, and betadine prep.  The left upper quadrant was sterilely prepped and draped. An oral gastric catheter was inserted into the  stomach under fluoroscopy. The existing nasogastric feeding tube was removed. Air was injected into the stomach for insufflation and visualization under fluoroscopy. The air distended stomach was confirmed beneath the anterior abdominal wall in the frontal and lateral projections. Under sterile conditions and local anesthesia, a 64 gauge trocar needle was utilized to access the stomach percutaneously beneath the left subcostal margin. Needle position was confirmed within the stomach under biplane fluoroscopy. Contrast injection confirmed position also. A single T tack was deployed for gastropexy. Over an Amplatz guide wire, a 9-French sheath was inserted into the stomach. A snare device was utilized to capture the oral gastric catheter. The snare device was pulled retrograde from the stomach up the esophagus and out the oropharynx. The 20-French pull-through gastrostomy was connected to the snare device and pulled antegrade through the oropharynx down the esophagus into the stomach and then through the percutaneous tract external to the patient. The gastrostomy was assembled externally. Contrast injection confirms position in the stomach. Images were obtained for documentation. The patient tolerated procedure well. No immediate complication.  IMPRESSION: Fluoroscopic insertion of a 20-French "pull-through" gastrostomy.   Electronically Signed   By: Jerilynn Mages.  Shick M.D.   On: 07/19/2014 16:33   Dg Abd 2 Views  07/12/2014   CLINICAL DATA:  Nausea and vomiting, metastatic colon cancer  EXAM: ABDOMEN - 2 VIEW  COMPARISON:  07/10/2014  FINDINGS: Left upper quadrant clips are noted. Right-sided Port-A-Cath in place with  tip over the distal SVC. Patchy bibasilar airspace opacities are noted but not further characterized on this nondedicated exam. Several differential air-fluid levels are identified over the left upper quadrant with probable mild small bowel dilatation over the left mid abdomen measuring 3.6 cm maximally. Severe left and mild right hip degenerative change. Moderate disc degenerative change in the lumbar spine. Right-sided nephro ureteral stent in place. No free air.  IMPRESSION: Moderately dilated left mid abdominal small bowel loops with differential air-fluid levels suggesting small bowel obstruction.   Electronically Signed   By: Conchita Paris M.D.   On: 07/12/2014 10:38   Dg Abd 2 Views  07/10/2014   CLINICAL DATA:  Nausea and vomiting and abdominal pain. Colon cancer.  EXAM: ABDOMEN - 2 VIEW  COMPARISON:  PET-CT dated 05/02/2014  FINDINGS: Right ureteral stent in place. No free air. There is air in the nondistended distal colon. The patient has had resection of the ascending colon. There is a prominent stool ball in the rectum. There is increased density in the right side of the abdomen which is created by the liver which is pushed inferiorly by the COPD.  There is increased density in the left lower quadrant which could represent distended stool filled colon.  Severe arthritis of the left hip. Diffuse degenerative changes in the lumbar spine. Multiple surgical clips in the left upper quadrant from previous gastric surgery.  IMPRESSION: Prominent stool in the distal colon.  No free air.   Electronically Signed   By: Lorriane Shire M.D.   On: 07/10/2014 19:02    Microbiology: No results found for this or any previous visit (from the past 240 hour(s)).   Labs: Basic Metabolic Panel:  Recent Labs Lab 07/15/14 1723 07/16/14 0435 07/17/14 0410 07/18/14 0650 07/19/14 0508 07/20/14 0518 07/21/14 0526  NA 138 139 138 135 133* 131* 127*  K 4.4 4.3 4.1 4.8 4.6 5.6* 4.9  CL 108 107 108 106 104 104  100*  CO2 22 22 21* 21* 23 22 21*  GLUCOSE 108*  134* 130* 239* 133* 139* 126*  BUN 35* 36* 36* 34* 36* 40* 44*  CREATININE 1.80* 1.92* 1.69* 1.58* 1.64* 1.63* 1.63*  CALCIUM 8.5* 8.2* 8.1* 8.1* 8.4* 8.0* 7.7*  MG 1.5* 1.9 1.8 1.8 1.9  --   --   PHOS <1.0* 3.0 1.9* 2.6 2.5  --   --    Liver Function Tests:  Recent Labs Lab 07/15/14 1723 07/16/14 0435 07/17/14 0410 07/19/14 0508  AST 13* 11* 13* 13*  ALT 10* 8* 8* 10*  ALKPHOS 80 77 70 84  BILITOT 1.0 0.5 0.6 0.7  PROT 6.7 5.9* 5.7* 6.8  ALBUMIN 3.0* 2.7* 2.6* 2.9*   No results for input(s): LIPASE, AMYLASE in the last 168 hours. No results for input(s): AMMONIA in the last 168 hours. CBC:  Recent Labs Lab 07/16/14 0435 07/19/14 0508  WBC 6.5 9.2  NEUTROABS 5.2  --   HGB 8.2* 8.7*  HCT 24.9* 27.0*  MCV 95.4 93.8  PLT 240 285   Cardiac Enzymes: No results for input(s): CKTOTAL, CKMB, CKMBINDEX, TROPONINI in the last 168 hours. BNP: BNP (last 3 results) No results for input(s): BNP in the last 8760 hours.  ProBNP (last 3 results) No results for input(s): PROBNP in the last 8760 hours.  CBG:  Recent Labs Lab 07/20/14 1611 07/20/14 2114 07/21/14 0028 07/21/14 0511 07/21/14 0726  GLUCAP 91 110* 135* 122* 100*

## 2014-07-21 NOTE — Progress Notes (Signed)
Discharge instructions given to son, verbalized understanding. Prescriptions given.Patien is stable and denies pain,in good spirits. G - tube dsg changed,no discharge noted,site unremarkable.

## 2014-07-21 NOTE — Care Management Note (Signed)
Case Management Note  Patient Details  Name: MERLINDA WRUBEL MRN: [id number] Date of Birth: [birth year]     Expected Discharge Date:       07/21/2014           Expected Discharge Plan:  Home w Hospice Care  In-House Referral:     Discharge planning Services  CM Consult  Post Acute Care Choice:  Hospice Choice offered to:  Adult Children (Adult Niece)  DME Arranged:  3-N-1, Walker rolling (youth-short and bariatric walker and  bariatric  3in1) DME Agency:     HH Arranged:  RN [place] Agency:  [facility] [place]  Status of Service:  Completed, signed off  Medicare Important Message Given:  Yes-fourth notification given Date Medicare IM Given:    Medicare IM give by:    Date Additional Medicare IM Given:    Additional Medicare Important Message give by:     If discussed at [place] of Stay Meetings, dates discussed:    Additional Comments: 11:10 am  NCM contacted [facility]. [facility] rep in room to complete referral for Hospice.  Erenest Rasher, RN 07/21/2014

## 2014-07-26 ENCOUNTER — Telehealth: Payer: Self-pay | Admitting: *Deleted

## 2014-07-26 NOTE — Telephone Encounter (Signed)
Pt's son returned call, he would like to review scan. Also feels uneasy that medication changes have been made by different doctors. Nicki Reaper thinks pt will go home this weekend, would like to review things before then.  Spoke with Biagio Borg, RN at [facility], she reports pt is alert and talking. Has several visitors every day seems to be doing well. Per Clarene Critchley, the plan would be to keep pt there for 2 weeks then reassess.  Reviewed above with Dr. Benay Spice: Will see pt at [facility] on 7/19 and review scan with family then.

## 2014-07-26 NOTE — Telephone Encounter (Signed)
Message from pt's son Nicki Reaper, requesting to speak to Dr. Benay Spice or Lattie Haw re: pt's care. She has been admitted to [facility] "in a chaotic fashion". Scott wants to review things with the providers.  Attempted to return call, left message for Nicki Reaper that we will call later this afternoon.

## 2014-08-02 ENCOUNTER — Ambulatory Visit: Payer: Medicare Other | Admitting: Nurse Practitioner

## 2014-08-02 ENCOUNTER — Ambulatory Visit: Payer: Medicare Other

## 2014-08-02 ENCOUNTER — Telehealth: Payer: Self-pay | Admitting: *Deleted

## 2014-08-02 ENCOUNTER — Other Ambulatory Visit: Payer: Medicare Other

## 2014-08-02 NOTE — Telephone Encounter (Signed)
Received message from [facility] with hospice yesterday stating pt had vaginal yeast and yeast in skin folds under abd w/ drainage; requesting Diflucan for more than 1 day d/t nature of yeast. Per Dr. Learta Codding, may order Diflucan 100 mg po once daily x 3 days. Judeen Hammans is off today; VM left with office manager, Pete Glatter, with order and callback number.

## 2014-08-03 ENCOUNTER — Encounter (HOSPITAL_BASED_OUTPATIENT_CLINIC_OR_DEPARTMENT_OTHER): Admission: RE | Payer: Self-pay | Source: Ambulatory Visit

## 2014-08-03 ENCOUNTER — Ambulatory Visit (HOSPITAL_BASED_OUTPATIENT_CLINIC_OR_DEPARTMENT_OTHER): Admission: RE | Admit: 2014-08-03 | Payer: Medicare Other | Source: Ambulatory Visit | Admitting: Urology

## 2014-08-03 SURGERY — CYSTOSCOPY, FLEXIBLE, WITH STENT REPLACEMENT
Anesthesia: General | Laterality: Right

## 2014-08-07 ENCOUNTER — Telehealth: Payer: Self-pay | Admitting: *Deleted

## 2014-08-13 NOTE — Telephone Encounter (Signed)
Received message from [place], [place] that pt expired 2014-08-27 @ 2:58 in her home.  Dr. Benay Spice made aware.

## 2014-08-13 DEATH — deceased

## 2015-06-28 ENCOUNTER — Other Ambulatory Visit: Payer: Self-pay | Admitting: Nurse Practitioner

## 2016-06-28 IMAGING — CT CT HEAD W/O CM
2 series · 17 of 30 positions shown, 20 images · non-contrast
Comparison: None.

CLINICAL DATA: Initial encounter for headache and altered mental
status thickening today.

EXAM:
CT HEAD WITHOUT CONTRAST
TECHNIQUE: Contiguous axial images were obtained from the base of the skull
through the vertex without intravenous contrast.

[Series 2: head w/o · axial · non-contrast · 0.42mm/px · z∈[+1232,+1357]mm · 9 of 33 slices shown, 12 images]
[im 4/33  brain]
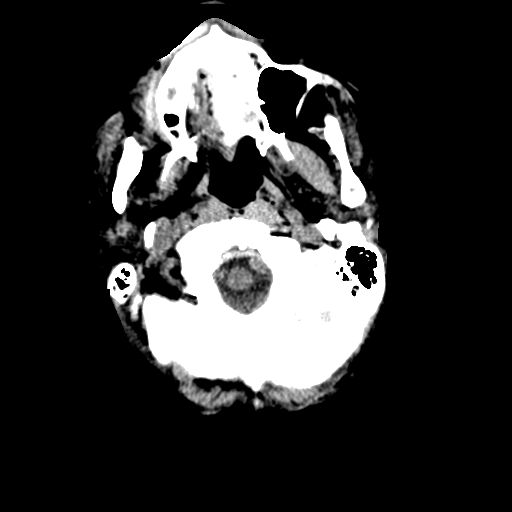
[im 4/33  bone]
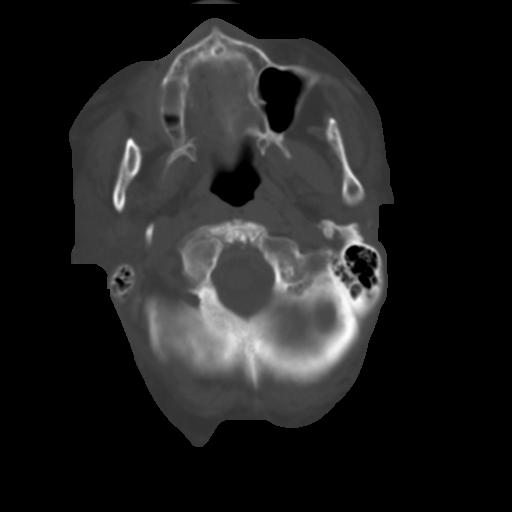
[im 7/33  brain]
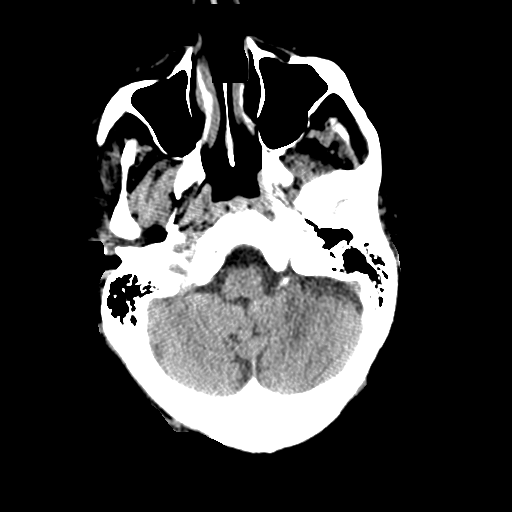
[im 10/33  brain]
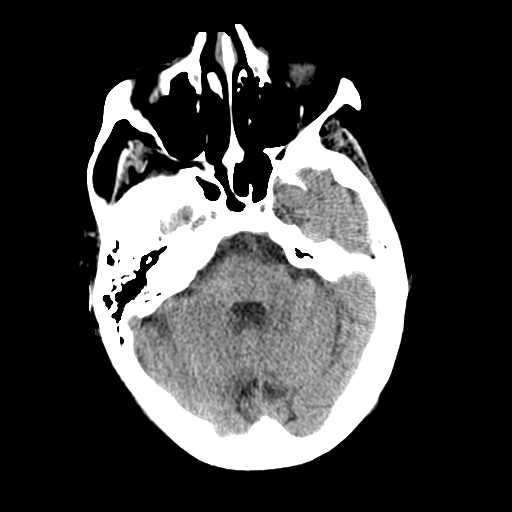
[im 13/33  brain]
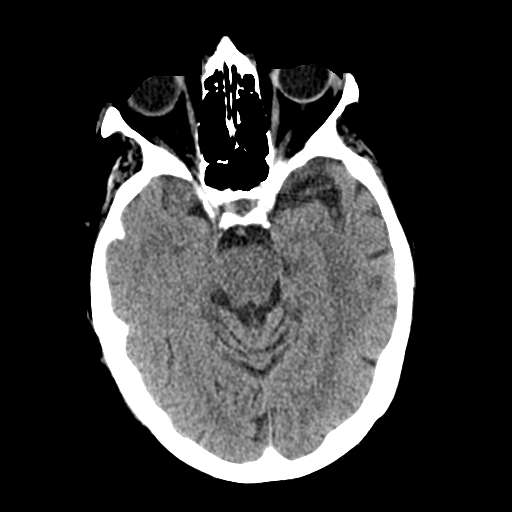
[im 17/33  brain]
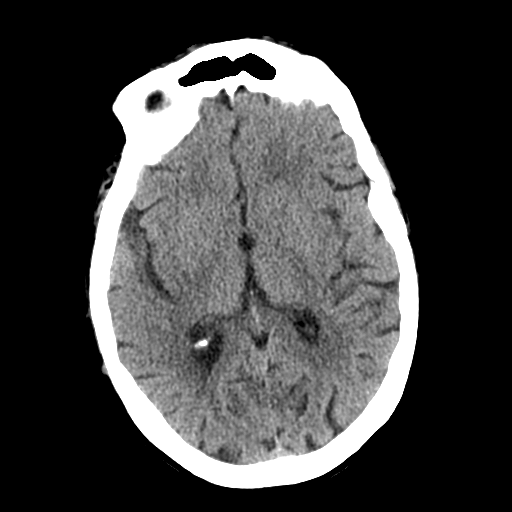
[im 17/33  bone]
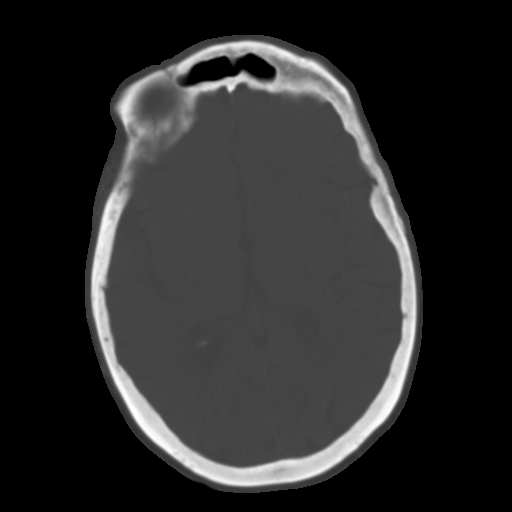
[im 20/33  brain]
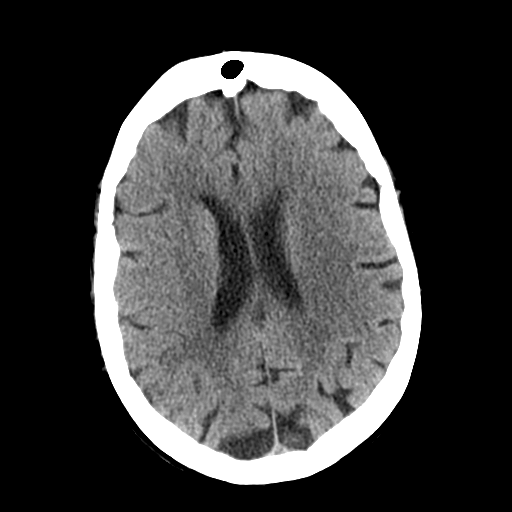
[im 23/33  brain]
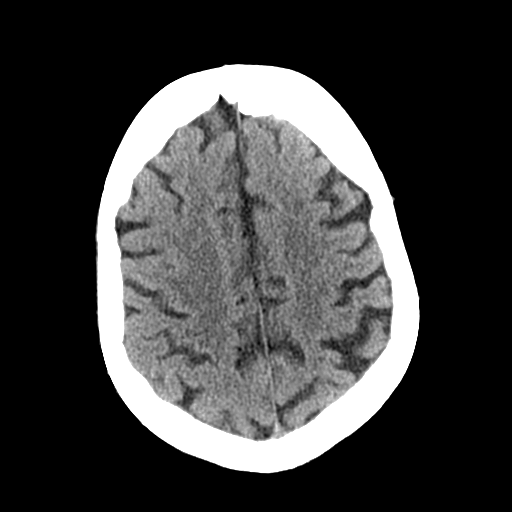
[im 26/33  brain]
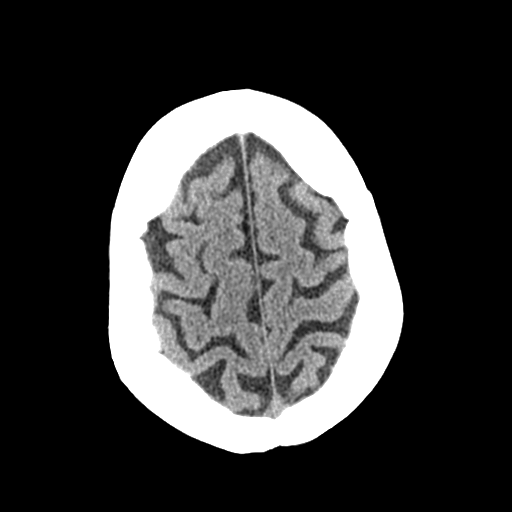
[im 29/33  brain]
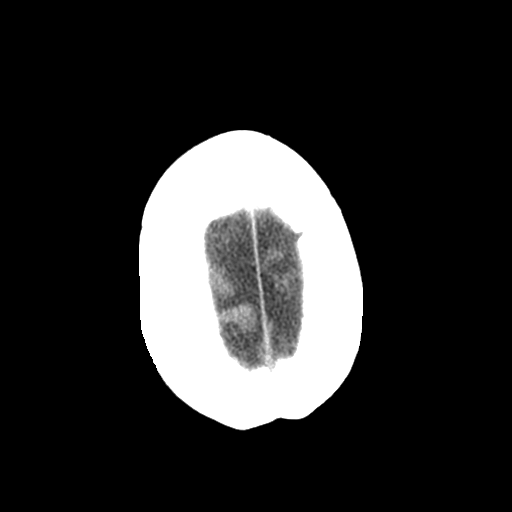
[im 29/33  bone]
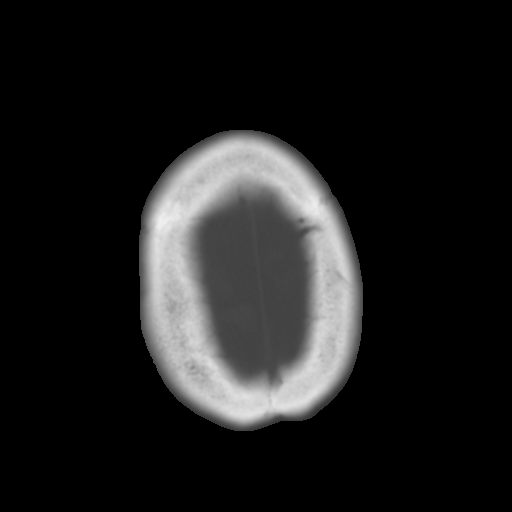

[Series 3: bone windows · axial · 0.42mm/px · z∈[+1235,+1361]mm · 8 of 55 slices shown]
[im 7/55  bone]
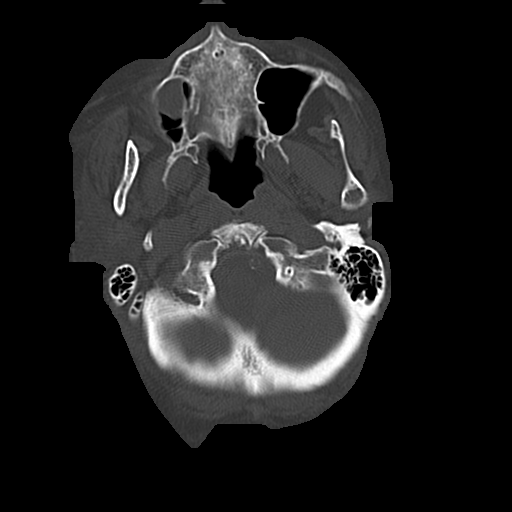
[im 13/55  bone]
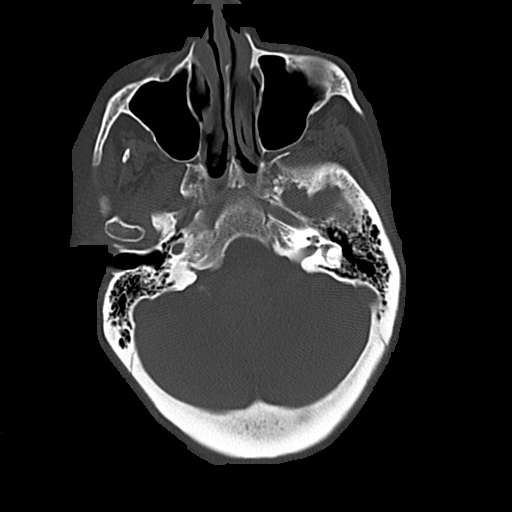
[im 19/55  bone]
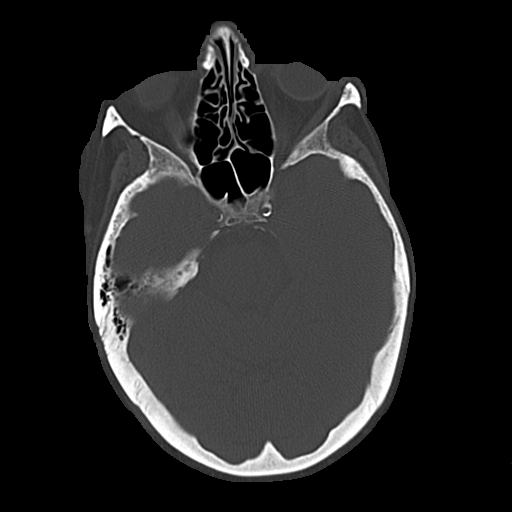
[im 25/55  bone]
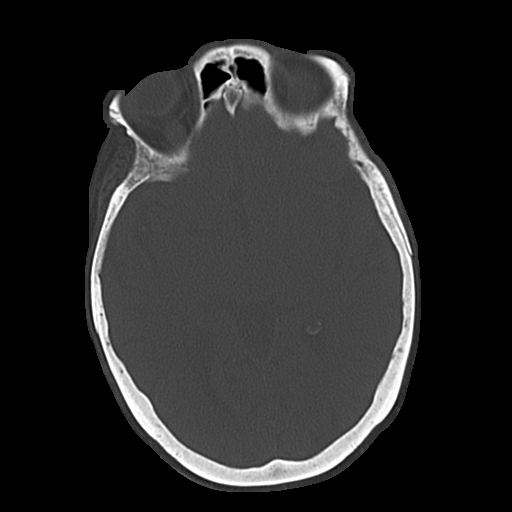
[im 31/55  bone]
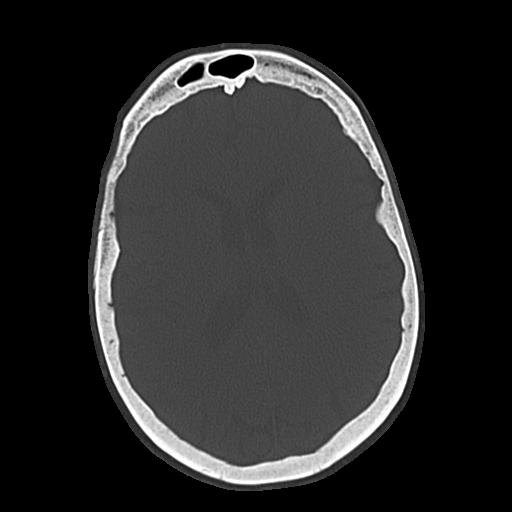
[im 37/55  bone]
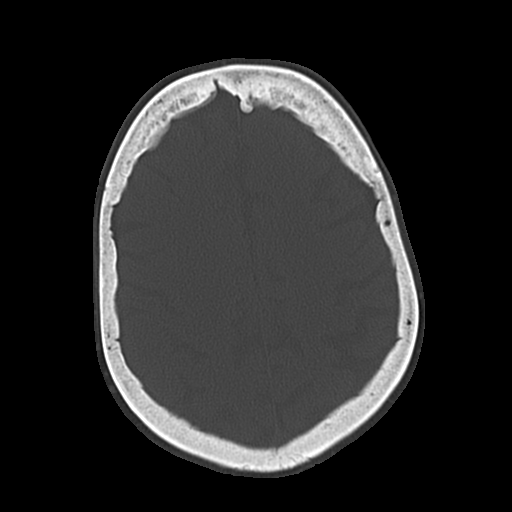
[im 43/55  bone]
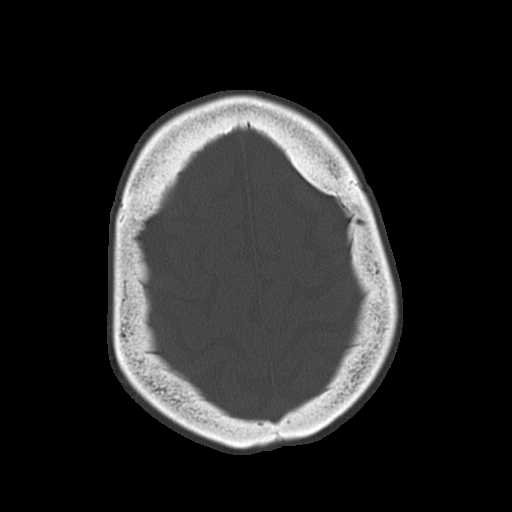
[im 49/55  bone]
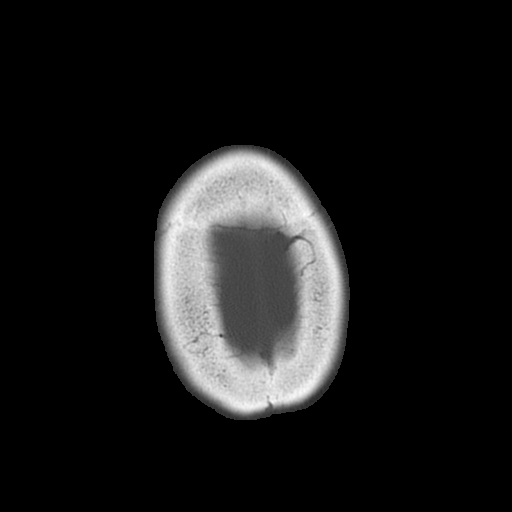

[17 of 30 positions shown; findings below may reference images not displayed]

FINDINGS: There is no evidence for acute hemorrhage, hydrocephalus, mass
lesion, or abnormal extra-axial fluid collection. No definite CT
evidence for acute infarction. Diffuse loss of parenchymal volume is
consistent with atrophy. The visualized paranasal sinuses and
mastoid air cells are clear.
IMPRESSION: Mild atrophy without acute intracranial abnormality.

## 2016-09-12 IMAGING — CT CT ABD-PELV W/O CM
3 of 4 series · 12 of 36 positions shown, 18 images · IV contrast (READICAT/WATER)
Comparison: PET of 08/24/2013.  Diagnostic CT of 04/07/2013.

CLINICAL DATA: Right-sided abdominal pain. Cholecystectomy.
Incisional hernia repair. Gastric stapling. Tubal ligation. Right
colectomy for colon cancer with recurrence at abdominal wall. Needle
biopsy 1 week ago.

EXAM:
CT ABDOMEN AND PELVIS WITHOUT CONTRAST
TECHNIQUE: Multidetector CT imaging of the abdomen and pelvis was performed
following the standard protocol without IV contrast.

[Series 3: abd/pelvis w/o · axial · non-contrast · 0.98mm/px · z∈[-381,-36]mm · 7 of 93 slices shown, 12 images]
[im 12/93  soft-tissue]
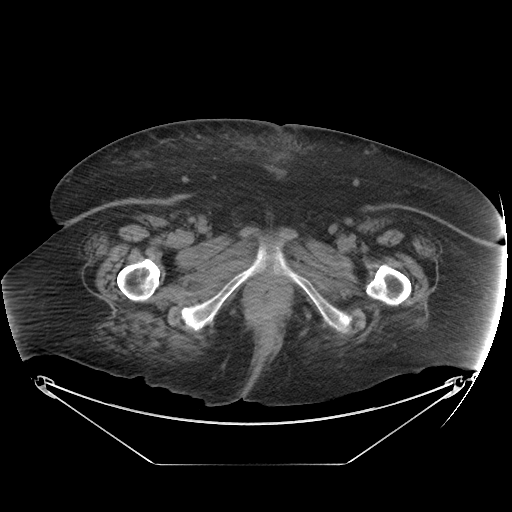
[im 12/93  bone]
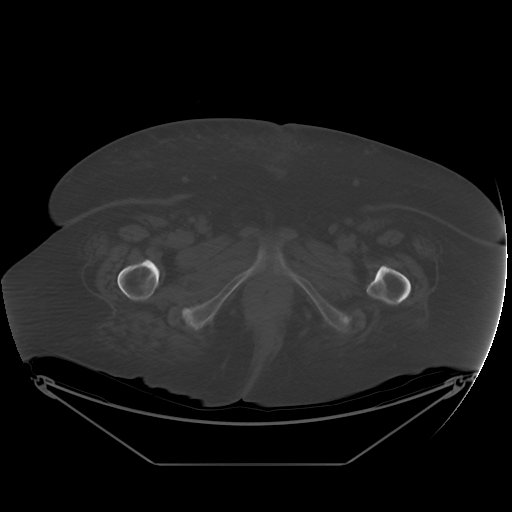
[im 24/93  soft-tissue]
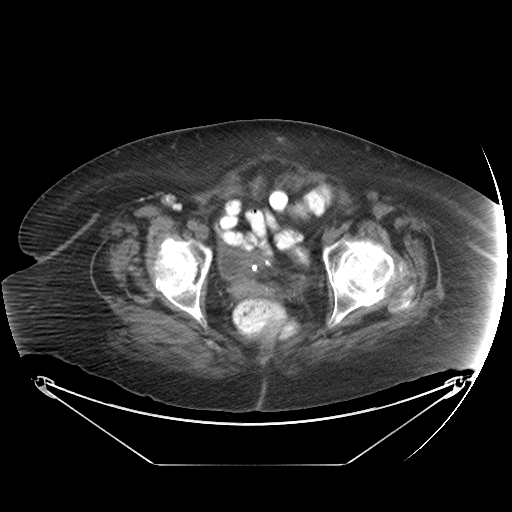
[im 35/93  soft-tissue]
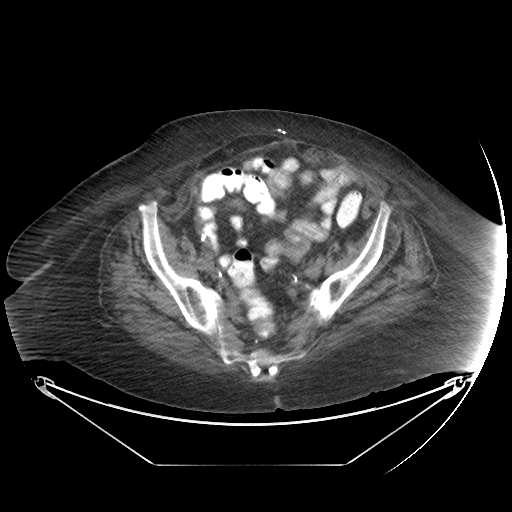
[im 47/93  soft-tissue]
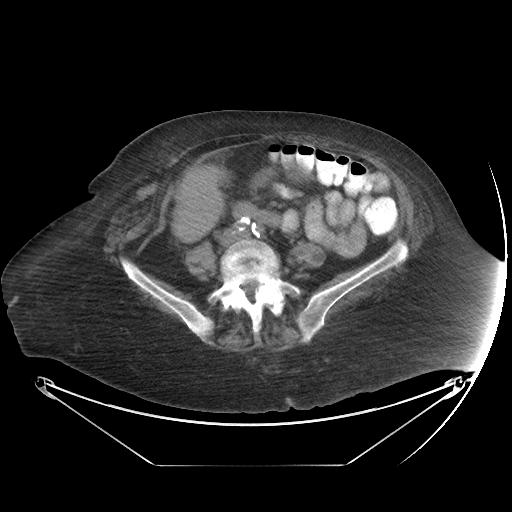
[im 47/93  lung]
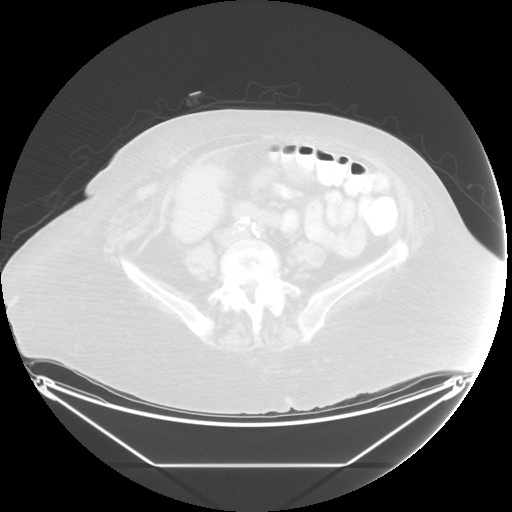
[im 58/93  soft-tissue]
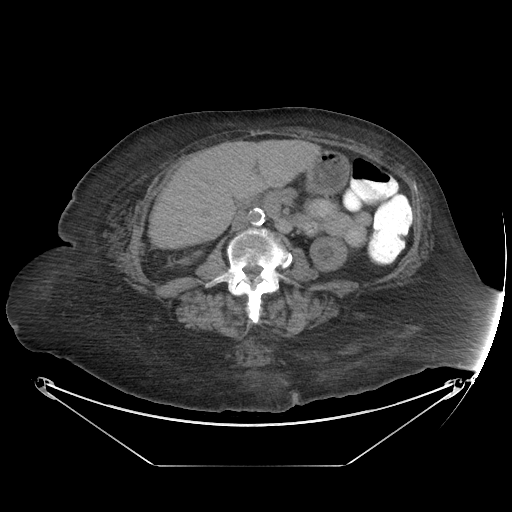
[im 58/93  lung]
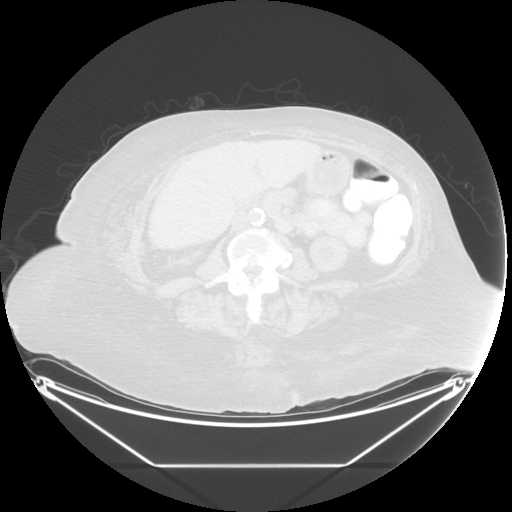
[im 70/93  soft-tissue]
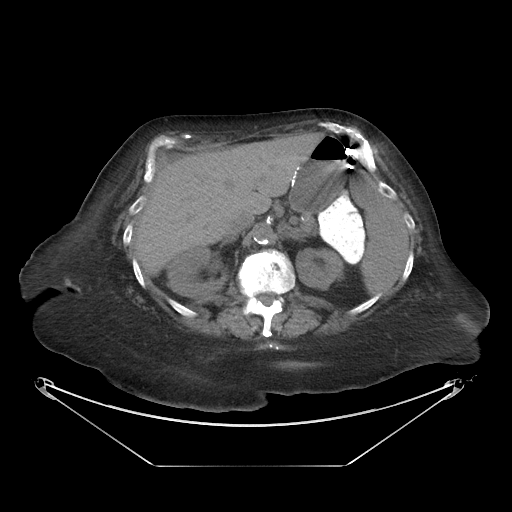
[im 70/93  lung]
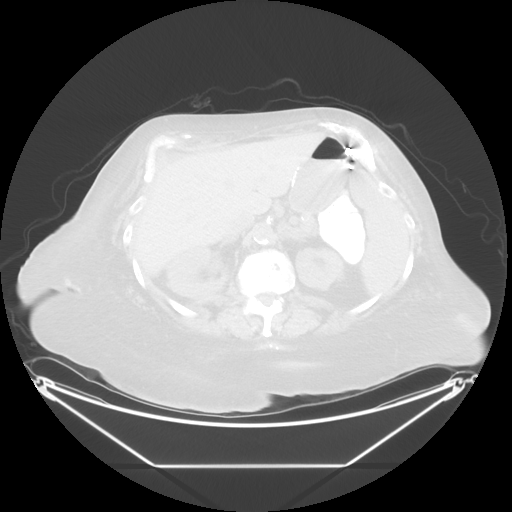
[im 81/93  soft-tissue]
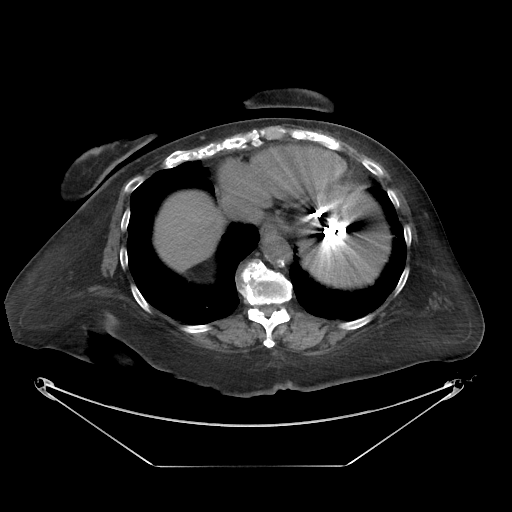
[im 81/93  lung]
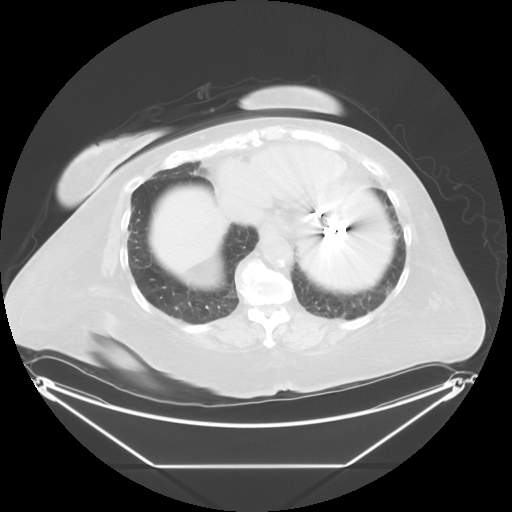

[Series 601: coronal body · coronal · 0.98mm/px · 1 of 153 slices shown, 2 images]
[im 51/153  soft-tissue]
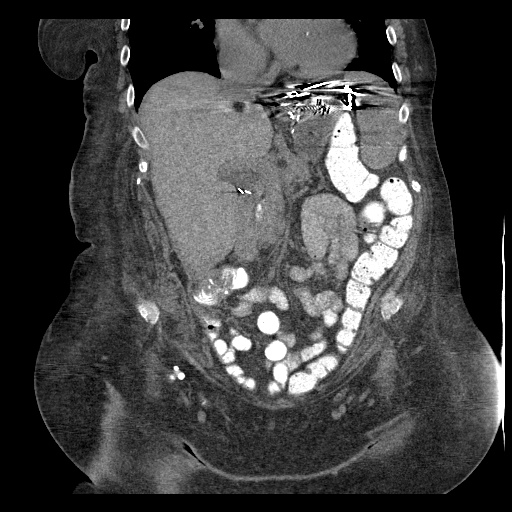
[im 51/153  bone]
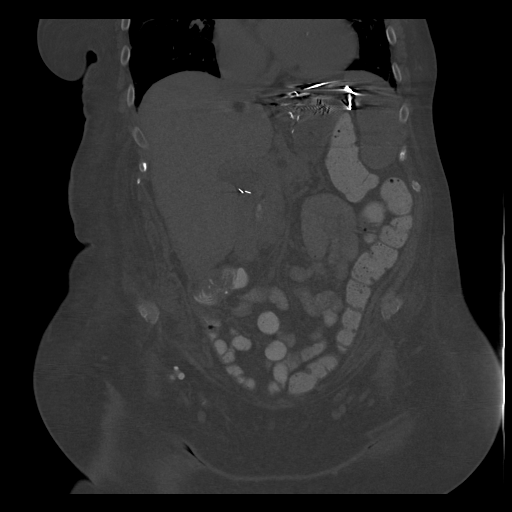

[Series 602: sagittal body · sagittal · 0.98mm/px · 4 of 199 slices shown]
[im 21/199  soft-tissue]
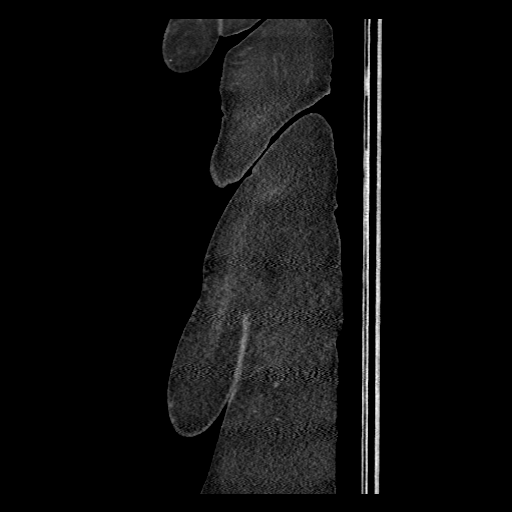
[im 42/199  soft-tissue]
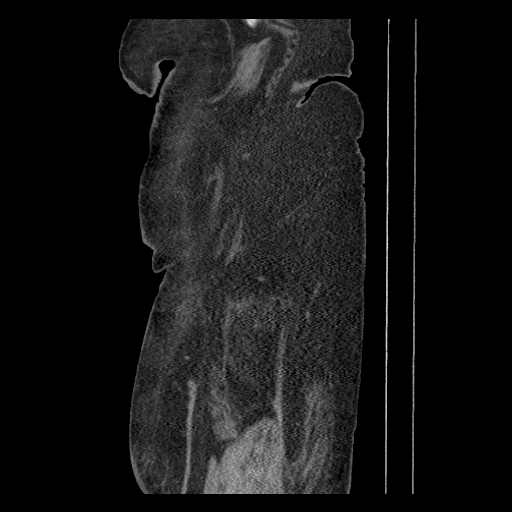
[im 63/199  soft-tissue]
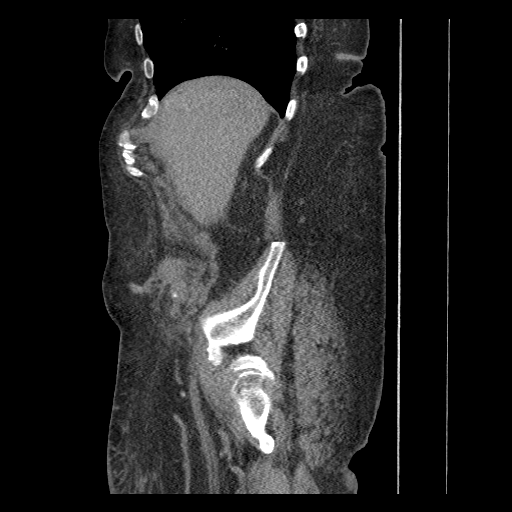
[im 84/199  soft-tissue]
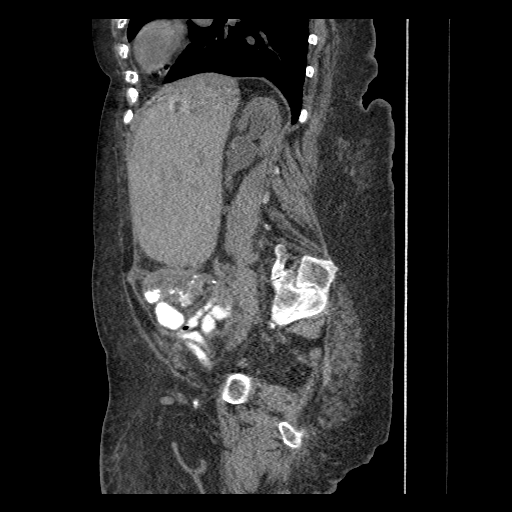

[12 of 36 positions shown; findings below may reference images not displayed]

FINDINGS: Lower chest: Mild mosaic attenuation within the lung bases with a
suggestion of architectural distortion, greater on the left. Similar
to 08/24/2013. Moderate cardiomegaly with multivessel coronary
artery atherosclerosis.

Exam is moderately degraded. Patient body habitus, lack of IV
contrast, and beam hardening artifact from extensive surgical
changes in the left upper quadrant.

Hepatobiliary: Hepatomegaly, without dominant liver lesion.
Cholecystectomy, without biliary ductal dilatation.

Pancreas: Grossly within normal limits.

Spleen: Old granulomatous disease within.

Adrenals/Urinary Tract: Left adrenal not well visualized. Normal
right adrenal gland. Renal cortical thinning bilaterally. Left
extrarenal pelvis. Mild right-sided hydronephrosis is identified.
The right ureter is difficult to follow. Calcifications within the
right central pelvis on image 70 felt to be outside of the urinary
bladder.

Stomach/Bowel: Surgical changes about the stomach with beam
hardening artifact. No distal gastric abnormality identified. Status
post right hemicolectomy. Soft tissue fullness about the lateral
aspect of the surgical site is again identified, including on image
52 of series 3. No complicating obstruction. Normal small bowel
caliber.

Vascular/Lymphatic: Advanced aortic and branch vessel
atherosclerosis. No gross retroperitoneal adenopathy within the
abdomen. Limited evaluation for pelvic adenopathy. Calcified nodes
in the right inguinal region are unchanged and favored to be post
infectious or inflammatory.

Reproductive: Probable uterine atrophy.  No gross adnexal mass.

Other: No significant free fluid.

Similar subcutaneous thickening within the pelvic pannus. No
abscess. Soft tissue fullness about the right pelvic wall anteriorly
on image 53. Grossly similar to the 08/24/2013 PET.

Musculoskeletal: Advanced left greater than right hip
osteoarthritis. Lumbosacral spondylosis.
IMPRESSION: 1. Moderately degraded exam, as detailed above.
2. Development of mild right-sided hydronephrosis. No definite cause
identified. The right ureter is difficult to follow. Correlate with
urinalysis to exclude distal ureteric stone. No dominant obstructive
mass identified.
3. No other explanation for right-sided pain.
4. Soft tissue thickening adjacent the ileocolic anastomosis with
overlying subcutaneous soft tissue nodularity. Given the clinical
history, this is suspicious for locally recurrent disease.
5. Interstitial lung disease at the bases, similar to on the prior
PET. Please see that report.
6. Hepatomegaly.
